# Patient Record
Sex: Male | Born: 1965
Health system: Southern US, Community
[De-identification: ages and names within clinical notes are randomized; demographics above are authoritative.]

## PROBLEM LIST (undated history)

## (undated) DIAGNOSIS — Q2112 Patent foramen ovale: Secondary | ICD-10-CM

## (undated) DIAGNOSIS — M7989 Other specified soft tissue disorders: Secondary | ICD-10-CM

## (undated) DIAGNOSIS — Q211 Atrial septal defect: Secondary | ICD-10-CM

## (undated) DIAGNOSIS — E785 Hyperlipidemia, unspecified: Secondary | ICD-10-CM

## (undated) DIAGNOSIS — I429 Cardiomyopathy, unspecified: Secondary | ICD-10-CM

## (undated) DIAGNOSIS — I351 Nonrheumatic aortic (valve) insufficiency: Secondary | ICD-10-CM

## (undated) HISTORY — PX: SHOULDER SURGERY: SHX246

## (undated) HISTORY — DX: Nonrheumatic aortic (valve) insufficiency: I35.1

## (undated) HISTORY — DX: Patent foramen ovale: Q21.12

## (undated) HISTORY — DX: Cardiomyopathy, unspecified: I42.9

## (undated) HISTORY — PX: OTHER SURGICAL HISTORY: SHX169

## (undated) HISTORY — PX: BACK SURGERY: SHX140

## (undated) HISTORY — PX: HERNIA REPAIR: SHX51

## (undated) HISTORY — PX: CARDIAC VALVE REPLACEMENT: SHX585

## (undated) HISTORY — PX: VASECTOMY: SHX75

## (undated) HISTORY — DX: Hyperlipidemia, unspecified: E78.5

## (undated) HISTORY — DX: Atrial septal defect: Q21.1

---

## 1997-11-27 ENCOUNTER — Ambulatory Visit (HOSPITAL_BASED_OUTPATIENT_CLINIC_OR_DEPARTMENT_OTHER): Admission: RE | Admit: 1997-11-27 | Discharge: 1997-11-27 | Payer: Self-pay | Admitting: Orthopedic Surgery

## 2001-08-18 ENCOUNTER — Encounter: Payer: Self-pay | Admitting: Internal Medicine

## 2001-08-18 ENCOUNTER — Ambulatory Visit (HOSPITAL_COMMUNITY): Admission: RE | Admit: 2001-08-18 | Discharge: 2001-08-18 | Payer: Self-pay | Admitting: Internal Medicine

## 2001-09-08 ENCOUNTER — Ambulatory Visit (HOSPITAL_COMMUNITY): Admission: RE | Admit: 2001-09-08 | Discharge: 2001-09-08 | Payer: Self-pay | Admitting: Internal Medicine

## 2001-09-08 ENCOUNTER — Encounter: Payer: Self-pay | Admitting: Internal Medicine

## 2002-01-09 ENCOUNTER — Observation Stay (HOSPITAL_COMMUNITY): Admission: AD | Admit: 2002-01-09 | Discharge: 2002-01-10 | Payer: Self-pay | Admitting: Family Medicine

## 2002-06-05 ENCOUNTER — Encounter: Payer: Self-pay | Admitting: Internal Medicine

## 2002-06-05 ENCOUNTER — Ambulatory Visit (HOSPITAL_COMMUNITY): Admission: RE | Admit: 2002-06-05 | Discharge: 2002-06-05 | Payer: Self-pay | Admitting: Internal Medicine

## 2002-06-26 ENCOUNTER — Encounter: Payer: Self-pay | Admitting: Internal Medicine

## 2002-06-26 ENCOUNTER — Ambulatory Visit (HOSPITAL_COMMUNITY): Admission: RE | Admit: 2002-06-26 | Discharge: 2002-06-26 | Payer: Self-pay | Admitting: Internal Medicine

## 2003-02-22 ENCOUNTER — Ambulatory Visit (HOSPITAL_COMMUNITY): Admission: RE | Admit: 2003-02-22 | Discharge: 2003-02-22 | Payer: Self-pay | Admitting: Internal Medicine

## 2003-06-11 ENCOUNTER — Ambulatory Visit (HOSPITAL_COMMUNITY): Admission: RE | Admit: 2003-06-11 | Discharge: 2003-06-11 | Payer: Self-pay | Admitting: Orthopedic Surgery

## 2004-10-08 ENCOUNTER — Ambulatory Visit: Payer: Self-pay | Admitting: Orthopedic Surgery

## 2004-10-22 ENCOUNTER — Ambulatory Visit: Payer: Self-pay | Admitting: Orthopedic Surgery

## 2004-10-30 ENCOUNTER — Ambulatory Visit (HOSPITAL_COMMUNITY): Admission: RE | Admit: 2004-10-30 | Discharge: 2004-10-30 | Payer: Self-pay | Admitting: Orthopedic Surgery

## 2004-10-30 ENCOUNTER — Ambulatory Visit: Payer: Self-pay | Admitting: Orthopedic Surgery

## 2004-11-02 ENCOUNTER — Ambulatory Visit: Payer: Self-pay | Admitting: Orthopedic Surgery

## 2004-11-09 ENCOUNTER — Ambulatory Visit: Payer: Self-pay | Admitting: Orthopedic Surgery

## 2004-11-23 ENCOUNTER — Ambulatory Visit: Payer: Self-pay | Admitting: Orthopedic Surgery

## 2004-12-21 ENCOUNTER — Ambulatory Visit: Payer: Self-pay | Admitting: Orthopedic Surgery

## 2005-01-13 ENCOUNTER — Ambulatory Visit: Payer: Self-pay | Admitting: Orthopedic Surgery

## 2005-01-20 ENCOUNTER — Ambulatory Visit: Payer: Self-pay | Admitting: Orthopedic Surgery

## 2005-10-06 ENCOUNTER — Ambulatory Visit: Payer: Self-pay | Admitting: Orthopedic Surgery

## 2005-11-10 ENCOUNTER — Ambulatory Visit: Payer: Self-pay | Admitting: Orthopedic Surgery

## 2006-01-11 ENCOUNTER — Ambulatory Visit (HOSPITAL_COMMUNITY): Admission: RE | Admit: 2006-01-11 | Discharge: 2006-01-11 | Payer: Self-pay | Admitting: Orthopedic Surgery

## 2006-11-01 ENCOUNTER — Ambulatory Visit (HOSPITAL_COMMUNITY): Admission: RE | Admit: 2006-11-01 | Discharge: 2006-11-01 | Payer: Self-pay | Admitting: Internal Medicine

## 2007-11-29 ENCOUNTER — Ambulatory Visit (HOSPITAL_COMMUNITY): Admission: RE | Admit: 2007-11-29 | Discharge: 2007-11-29 | Payer: Self-pay | Admitting: Family Medicine

## 2008-05-17 ENCOUNTER — Ambulatory Visit (HOSPITAL_COMMUNITY): Admission: RE | Admit: 2008-05-17 | Discharge: 2008-05-17 | Payer: Self-pay | Admitting: Internal Medicine

## 2008-06-03 ENCOUNTER — Ambulatory Visit (HOSPITAL_COMMUNITY): Admission: RE | Admit: 2008-06-03 | Discharge: 2008-06-04 | Payer: Self-pay | Admitting: Neurosurgery

## 2010-05-17 ENCOUNTER — Encounter: Payer: Self-pay | Admitting: Orthopedic Surgery

## 2010-08-11 LAB — CBC
HCT: 44.9 % (ref 39.0–52.0)
Hemoglobin: 15.8 g/dL (ref 13.0–17.0)
MCHC: 35.3 g/dL (ref 30.0–36.0)
MCV: 91.6 fL (ref 78.0–100.0)
Platelets: 186 10*3/uL (ref 150–400)
RBC: 4.91 MIL/uL (ref 4.22–5.81)
RDW: 12.4 % (ref 11.5–15.5)
WBC: 5.9 10*3/uL (ref 4.0–10.5)

## 2010-09-08 NOTE — Op Note (Signed)
NAME:  Donald [name], SEVERNS NO.:  [id number]   MEDICAL RECORD NO.:  [id number]          PATIENT TYPE:  OIB   LOCATION:  3533                         FACILITY:  [facility]   PHYSICIAN:  Hewitt Shorts, M.D.DATE OF BIRTH:  03-03-1966   DATE OF PROCEDURE:  DATE OF DISCHARGE:                               OPERATIVE REPORT   PREOPERATIVE DIAGNOSIS:  1. Left L5 lumbar disk herniation.  2. Lumbar degenerative disk disease.  3. Lumbar spondylosis.  4. Lumbar radiculopathy.   POSTOPERATIVE DIAGNOSES:  1. Left L5 lumbar disk herniation.  2. Lumbar degenerative disk disease.  3. Lumbar spondylosis.  4. Lumbar radiculopathy.   PROCEDURE:  Left L5 hemilaminotomy and microdiskectomy with  microdissection.   SURGEON:  Hewitt Shorts, MD   ANESTHESIA:  General endotracheal.   INDICATIONS:  The patient is a 45 year old man who presented with left  lumbar radiculopathy.  He was found to have a very large disk herniation  that had migrated behind the body of the L5 causing significant thecal  sac and nerve root compression.  Decision was made to proceed with  laminotomy and microdiskectomy.   Of note, the patient has had a previous left L5-S1 diskectomy in 1990 at  [facility], complicated by cerebrospinal fluid leak that required  two further surgeries.  The previous laminotomy was noted on MRI.   PROCEDURE:  The patient was brought to the operating room, placed under  general endotracheal anesthesia.  The patient was turned to a prone  position.  Lumbar region was prepped with Betadine soap and solution and  draped in a sterile fashion.  The previous midline incision was marked  and the midline was infiltrated with local anesthetic with epinephrine  and an x-ray taken and the L5 lamina identified and incision was made,  centered over the L5 lamina through the previous midline incision and  dissection was carried down through the subcutaneous tissue.  Bipolar  cautery and electrocautery was used to maintain hemostasis.  Dissection  was carried down to the lumbar fascia, which was incised on the left  side of the midline and paraspinal muscles were dissected from the  spinous process and lamina in a subperiosteal fashion.  A self-retaining  retractor was placed at the L4-L5, and the L5-S1 intralaminar space were  identified, the previous left L5-S1 laminotomy was noted.  The x-ray was  taken and the L5 lamina confirmed and identified and then the microscope  was draped and brought into the field to provide additional navigation,  illumination, and visualization.  The remainder of the decompression was  performed using microdissection and microsurgical technique.   We dissected the edge of the superior aspect of the L5 laminotomy and  also defined the superior edge of the L5 lamina at the L4-L5  intralaminar space and then using the Stryker pneumatic high-speed  drill, a left L5 hemilaminectomy was performed along with the use of  Kerrison punches.  We carefully dissected the underlying thecal sac from  mid lamina and once the hemilaminectomy was completed, we dissected  laterally to the lateral aspect of  the epidural space through the  ventral lateral aspect of the spinal canal.  The disk herniation was  identified and we began diskectomy, removing free fragment with  pituitary rongeurs and using micro hook, we were able to further  mobilize this very large disk herniation from the surrounding epidural  tissues and the large disk herniation was removed in a piecemeal fashion  and we thoroughly examined the epidural space, removing all loose  fragments and disk material.  We did not enter into the disk space at  either the L4-L5 or the L5-S1 level, but we slowly removed the large  free fragment from the epidural space and at the end all loose fragment  of the disk material were removed.  The wound was irrigated with  Bacitracin solution.  We used  some Gelfoam with thrombin to help  establish hemostasis and that was removed prior to closure.  The wound  was irrigated numerous times with Bacitracin solution.  Once hemostasis  was confirmed, we instilled 2 mL of fentanyl and 80 mg of Depo-Medrol  into the epidural space and proceeded with closure.  The deep fascia  with interrupted undyed #1 Vicryl sutures.  Subcutaneous and  subcuticular were closed with interrupted inverted 2-0 undyed Vicryl  sutures.  The skin was approximated with Dermabond.  The procedure was  tolerated well.  The estimated blood loss was 25 mL.  Sponge and needle  count were correct.  Following surgery, the patient was returned back to  the supine position to be reversed from the anesthetic, extubated, and  transferred to the recovery room for further care.      Hewitt Shorts, M.D.  Electronically Signed     RWN/MEDQ  D:  06/03/2008  T:  06/04/2008  Job:  [id number]

## 2010-09-11 NOTE — H&P (Signed)
NAME:  Donald [name], Donald [name] NO.:  [id number]   MEDICAL RECORD NO.:  [id number]                  PATIENT TYPE:   LOCATION:                                       FACILITY:   PHYSICIAN:  Vickki Hearing, M.D.           DATE OF BIRTH:   DATE OF ADMISSION:  DATE OF DISCHARGE:                                HISTORY & PHYSICAL   CHIEF COMPLAINT:  Right shoulder pain.   HISTORY:  A 45 year old male status post open distal clavicle excision of  the left shoulder without complaints.  However, he does complain in the  right shoulder which has not responded to nonoperative treatment including  injections, pain medication and anti-inflammatories.  At this time, he is  having severe right shoulder pain with burning over the distal clavicle,  decreased range of motion and inflammatory type pain of the right anterior  deltoid and subacromial region.  There are associated signs of weakness and  nocturnal symptoms related to the [facility] joint and right deltoid.   REVIEW OF SYSTEMS:  Migraines and anxiety.  Otherwise, normal.   ALLERGIES:  ULTRAM.   MEDICAL PROBLEMS:  Insomnia.   SURGERY:  Herniorrhaphy x5, three surgical disks, left tendon injury of the  thumb, left shoulder distal clavicle excision, pharmacy __________  .   MEDICATIONS:  1. Ambien.  2. Klonopin.  3. Lorcet Plus.   FAMILY HISTORY:  Negative.   SOCIAL HISTORY:  The patient is married.  He is a Merchandiser, retail at ________.  He does not smoke.  He does drink alcohol socially.  Caffeine use - yes.  Highest grade completed - high school diploma and this various technical  certificates.   PHYSICAL EXAMINATION:  VITAL SIGNS:  His weight is 210, pulse 80,  respiratory rate 18.  GENERAL:  He is well developed and nourished.  Grooming and hygiene are  normal.  He has no deformity.  Body habitus is normal with slightly greater  than average height and frame is medium.  CARDIOVASCULAR:  No swelling or  varicosities.  Pulses are intact.  Normal  temperature. No edema or tenderness.  CERVICAL:  Normal cervical lymph nodes.  NEUROLOGIC:  Gait and station is normal.  Left shoulder range of motion  normal.  Stability strength is normal without swelling scar noted left  shoulder.  EXTREMITIES:  Right upper extremity:  Active range of motion, flexion 90  degrees.  External rotation 45%.  Internal rotation front pocket, passive  range of motion is painful about 90 degrees.  He has negative drop test.  Shoulder is stable.  His cuff is intact, it appears.  His impingement signs  were positive and his cross chest AC joint test was positive along with  tenderness over the [facility] joint.  His skin was normal on the right and  including head and neck.  His neurologic and psychological testing showed  normal reflexes, sensation and coordination.  He was awake, alert and  oriented x3.  No depression, anxiety or agitation.   Data included MRI.  In the MRI report, he has a partial tear of his  supraspinatus tendon.  He has a degenerative AC joint.   DIAGNOSES:  1. AC joint arthritis.  2. Partial tear rotator cuff.  3. Impingement syndrome right shoulder.   RECOMMENDATIONS:  Arthroscopic subacromial decompression, possible open  rotator cuff repair, open versus arthroscopic distal clavicle excision.   The patient is given informed consent and agrees to proceed with the  procedure understanding the risks and benefits and possibility of being out  of work for two to three months.   Plan procedure code 10272 and 23120.51.  Diagnosis code 715.117, 26.10,  727.61     ___________________________________________                                         Vickki Hearing, M.D.   SEH/MEDQ  D:  05/16/2003  T:  05/16/2003  Job:  [id number]

## 2010-09-11 NOTE — H&P (Signed)
NAME:  Donald [name], Donald [name] NO.:  [id number]   MEDICAL RECORD NO.:  [id number]          PATIENT TYPE:  AMB   LOCATION:  DAY                           FACILITY:  [facility]   PHYSICIAN:  Vickki Hearing, M.D.DATE OF BIRTH:  [birth year]   DATE OF ADMISSION:  10/29/2004  DATE OF DISCHARGE:  LH                                HISTORY & PHYSICAL   CHIEF COMPLAINT:  Right shoulder pain.   HISTORY OF PRESENT ILLNESS:  This is a 45 year old male who had a right  shoulder arthroscopic subacromial decompression and open Mumford procedure.  In February 2005, he did well until approximately 1-2 months ago when his  pain in his shoulder over this [facility] joint posteriorly started to come back.  He did have a recent MRI done which showed tendinosus in the distal  supraspinatus and infraspinatus tendons with partial thickness tearing of  the distal supraspinatus.  Marrow edema was noted at the [facility] joint remaining  distal clavicle.  We treated him with injection, observation, rest and anti-  inflammatories as well as analgesics and his pain has persisted.  He is  therefore presenting now for a repeat arthroscopic procedure and exploration  of the posterior aspect of the open Mumford.  On occasion, there is residual  bone or osteophytes which occur in this area.   PAST MEDICAL HISTORY:  1.  Migraines.  2.  Anxiety.  3.  History of insomnia.   PAST SURGICAL HISTORY:  1.  Herniorrhaphy x5.  2.  Surgical disc x3.  3.  Tendon injury, left thumb, with repair.  4.  Left shoulder distal clavicle excision.   MEDICATIONS:  Klonopin, Ambien.   ALLERGIES:  ULTRAM.   FAMILY HISTORY:  Negative.   SOCIAL HISTORY:  He is married.  He is a Merchandiser, retail.  He does not smoke.  He  drinks socially.  His caffeine use is reported as positive.  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   PHYSICAL EXAMINATION:  VITAL SIGNS:  Weight 210, pulse 80, respirations 18.  GENERAL:   Well-developed, well-nourished.  Grooming and hygiene are normal.  No deformity.  Body habitus normal.  Slightly greater than average height  and frame is medium.  CARDIAC:  No swelling or varicosities.  Temperature normal.  No edema or  tenderness.  LYMPHS:  Cervical lymph nodes are benign.  NEUROLOGIC:  Normal neurologic and psychological tests.  EXTREMITIES:  Right shoulder with tenderness at the posterior aspect of his  [facility] joint resection.  There is painful range of motion of the shoulder with a  positive impingement sign.  Supraspinatus tendon, however, is graded 5/5.  Shoulder is stable.  He has MRI findings as noted.   IMPRESSION:  Pain, right shoulder.  Possible residual osteophytes of distal  clavicle.   PLAN:  Right shoulder arthroscopy and exploration of the resection of bone  at the [place] site.       SEH/MEDQ  D:  10/29/2004  T:  10/29/2004  Job:  [id number]

## 2010-09-11 NOTE — Op Note (Signed)
NAME:  CALVERT, CHARLAND                           ACCOUNT NO.:  [id number]   MEDICAL RECORD NO.:  [id number]                   PATIENT TYPE:  AMB   LOCATION:  DAY                                  FACILITY:  [facility]   PHYSICIAN:  Vickki Hearing, M.D.           DATE OF BIRTH:  09-Jan-1966   DATE OF PROCEDURE:  DATE OF DISCHARGE:                                 OPERATIVE REPORT   PREOPERATIVE DIAGNOSES:  1. Partial tear of right rotator cuff.  2. Acromioclavicular joint arthritis.  3. Impingement syndrome, right shoulder.   POSTOPERATIVE DIAGNOSES:  1. Partial tear of right rotator cuff.  2. Acromioclavicular joint arthritis.  3. Impingement syndrome, right shoulder.   PROCEDURE:  Arthroscopic subacromial decompression, debridement of  undersurface of rotator cuff, and open Mumford procedure.   SURGEON:  Vickki Hearing, M.D.   ANESTHESIA:  General.   FINDINGS:  There was undersurface tearing of the rotator cuff.  There was a  sublabral foramen.  There were some degenerative changes of the biceps  tendon.  There was AC joint arthrosis.  The subacromial space was relatively  benign.   INDICATIONS FOR PROCEDURE:  Right shoulder persistent pain after  nonoperative treatment.   DESCRIPTION OF PROCEDURE:  Mr. Masden was identified in the holding area.  His  right shoulder was marked as the operative site.  He was taken to the  operating room after being given a gram of Ancef and general anesthetic.  He  had a sterile prep and drape after being placed in a lateral decubitus  position with a 10-pound weight and a shoulder distraction harness applied  to his right arm.  We took a time out and confirmed the patient's name,  procedure, and extremity.  All agreed, and we proceeded.   We did a diagnostic arthroscopy via a posterior portal.  We established an  anterior portal with an inside out technique.  We did a debridement of his  rotator cuff and biceps tendon degenerative changes.   We placed the scope in  the subacromial space and made a lateral portal.  We did a subacromial  decompression.  We made a second anterior portal and did an AC joint  resection.  At that point, visualization was poor, and we proceeded to an  open procedure.   An incision was made over the [facility] joint, divided down to the fascia which was  split in line with the clavicle.  The [facility] joint was resected using a saw.  We  placed bone wax over the end of the bone, irrigated the wound, and closed in  layered fashion with #1 and 2-0 Vicryl suture.  We applied staples and 30 cc  of Marcaine in the area.  We closed the portal sites with Prolene and Steri-  Strips.   We dressed him sterilely, extubated him, took him to the recovery room, and  placed him in a shoulder  immobilizer and CryoCuff.  He was allowed to be  discharged with a followup in 48 hours.      ___________________________________________                                            Vickki Hearing, M.D.   SEH/MEDQ  D:  06/11/2003  T:  06/11/2003  Job:  [id number]

## 2010-09-11 NOTE — H&P (Signed)
NAME:  CELESTINE, BOUGIE NO.:  [id number]   MEDICAL RECORD NO.:  [id number]                   PATIENT TYPE:  INP   LOCATION:  A228                                 FACILITY:  [facility]   PHYSICIAN:  Corrie Mckusick, M.D.               DATE OF BIRTH:  08/16/1965   DATE OF ADMISSION:  01/09/2002  DATE OF DISCHARGE:                                HISTORY & PHYSICAL   PRIMARY M.D.:  Dr. Sherwood Gambler.   HISTORY OF PRESENT ILLNESS:  The patient is a 45 year old gentleman with a  history of migraine headaches who presents with intermittent chest pain off  and on for the months preceding.  Now for the last week he has had  increasing left-sided chest pain, mostly sharp in nature and not too dull  until last evening.  At work he had been working increased hours and seemed  to get more increasing chest pain with exertion.  When he would rest, the  chest pain would go away.  The chest pain seemed to be midsternal and in the  left side as well.  Some radiation to the left shoulder and left forearm.  There is no associated nausea, vomiting, diaphoresis.  No shortness of  breath.  No fevers or other symptomatology.   PAST MEDICAL HISTORY:  Migraine headaches.   PAST SURGICAL HISTORY:  1. Hernia repair in 1974.  2. Left leg surgery in 1981.  3. Back surgery x 3 in 1996.  4. Left hand surgery x 3 in 1998 and 1999.   SOCIAL HISTORY:  Past history of smoking but none now.  Occasional alcohol  use.   FAMILY HISTORY:  Significant for some early coronary disease.   ADMISSION MEDICATIONS:  None.   ALLERGIES:  ULTRAM and LATEX.   OCCUPATION:  Soil scientist.   PHYSICAL EXAMINATION:  VITAL SIGNS:  Pulse 78, respirations 18, blood  pressure 110/85.  Weight 213.  GENERAL:  When I saw the patient he was pleasant, talkative, in no acute  distress, and was chest pain-free.  HEENT:  Normocephalic, atraumatic.  Pupils are equal and round with  extraocular movements intact.  Nasal  and oropharynx clear.  NECK:  Supple.  No JVD.  CHEST:  Clear to auscultation bilaterally.  CARDIOVASCULAR:  Regular rate and rhythm with occasional ectopy.  Normal S1,  S2.  No S3, S4, gallops or rubs.  ABDOMEN:  Soft, nontender, nondistended.  EXTREMITIES:  No cyanosis, clubbing, no edema.   LABORATORY DATA:  ECG showed normal sinus rhythm with a rate of 68, with  incomplete right bundle branch block, early repolarization, and some strain.  No other acute ST changes.   ASSESSMENT:  The patient is a 45 year old gentleman with intermittent chest  pain, now progressive.  Questionable exertional.   PLAN:  1. Admit to [facility] on 2A Telemetry for rule out protocol.  2. CPK, MB, troponins  x 3 q.8h.  3. CBC, Chem-12 on admission.  4. Fasting lipids.  5. Start Toprol at 50 mg q.d.  6. Aspirin 325 q.d., first dose given in the office.  7. Trial of Aciphex as could be GERD-related.  8. Consult [facility] for further risk stratification.                                               Corrie Mckusick, M.D.    JCG/MEDQ  D:  01/09/2002  T:  01/09/2002  Job:  [id number]

## 2010-09-11 NOTE — Op Note (Signed)
NAME:  [name], [name]:  [id number]   MEDICAL RECORD NO.:  [id number]          PATIENT TYPE:  AMB   LOCATION:  DAY                           FACILITY:  [facility]   PHYSICIAN:  Vickki Hearing, M.D.DATE OF BIRTH:  12-31-1965   DATE OF PROCEDURE:  10/30/2004  DATE OF DISCHARGE:                                 OPERATIVE REPORT   PREOPERATIVE DIAGNOSIS:  Pain, right shoulder, rotator cuff syndrome.   POSTOPERATIVE DIAGNOSIS:  Torn rotator cuff, right shoulder, supraspinatus  tendon and leading edge of subscapularis.   INDICATIONS FOR PROCEDURE:  Persistent pain.   ANESTHETIC:  General.   OPERATIVE FINDINGS:  The leading edge of the subscapularis tendon was torn.  There was a undersurface tear of the rotator cuff approximately 75% in  depth. There was some sinusitis along the biceps anchor and the glenohumeral  joint. The subacromial space decompression was normal. There was a posterior  superior spur along the posterior superior distal clavicle.   SPECIMENS:  None.   BLOOD LOSS:  20 to 30 cc.   COMPLICATIONS:  No complications. The patient went to PACU in good  condition.   The patient was identified as Donald [name]. His right shoulder was marked as  the surgical site, countersigned by the surgeon. History and physical was  updated. Antibiotics were given. He was taken to the operating room for  general anesthesia. After successful anesthesia, he was placed in a modified  beach-chair position on a Schlein shoulder positioner. After sterile prep  and drape, time-out was taken and completed as required.   The posterior approach to the shoulder was used. A diagnostic arthroscopy  was done via posterior approach. Findings are as stated. Through an anterior  portal created with an inside-out technique, a shaver was used to debride  the biceps tendon. Leading edge of the subscapularis, undersurface tear of  the supraspinatus.   The scope was then placed in  the subacromial space. The decompression from  the previous operation was completely normal. There was evidence of superior  surface tearing as well involving the supraspinatus tendon.   Through a lateral portal, a suture was placed in the tear to mark the tear,  and this was taken out through the anterior portal.   An incision was made over the anterolateral acromion, taken down to the  bursa the deltoid which was split in line with the skin incision. The bursa  was removed, and the tear marking suture was used to find the rotator cuff  tear at which time we placed four fiber wire sutures. We then turned our  attention to the previous Mumford incision and took that down to the  underlying periosteum and removed a posterior superior spur.   Both wounds were irrigated and closed in layered fashion. We used staples to  close the skin. We injected 30 cc of Marcaine. We placed the patient in a  CryoCuff after the sterile dressings were applied. He was extubated in  recovery room in stable condition.   He will follow up on Monday and will start physical therapy  next week       SEH/MEDQ  D:  10/30/2004  T:  10/30/2004  Job:  [id number]

## 2010-10-27 ENCOUNTER — Encounter: Payer: Self-pay | Admitting: Orthopedic Surgery

## 2010-10-27 ENCOUNTER — Ambulatory Visit (INDEPENDENT_AMBULATORY_CARE_PROVIDER_SITE_OTHER): Payer: BC Managed Care – PPO | Admitting: Orthopedic Surgery

## 2010-10-27 VITALS — Ht 73.0 in | Wt 218.0 lb

## 2010-10-27 DIAGNOSIS — S43429A Sprain of unspecified rotator cuff capsule, initial encounter: Secondary | ICD-10-CM

## 2010-10-27 DIAGNOSIS — M75101 Unspecified rotator cuff tear or rupture of right shoulder, not specified as traumatic: Secondary | ICD-10-CM

## 2010-10-27 DIAGNOSIS — S43439A Superior glenoid labrum lesion of unspecified shoulder, initial encounter: Secondary | ICD-10-CM

## 2010-10-27 DIAGNOSIS — S4990XA Unspecified injury of shoulder and upper arm, unspecified arm, initial encounter: Secondary | ICD-10-CM | POA: Insufficient documentation

## 2010-10-27 DIAGNOSIS — S43499A Other sprain of unspecified shoulder joint, initial encounter: Secondary | ICD-10-CM

## 2010-10-27 DIAGNOSIS — S46819A Strain of other muscles, fascia and tendons at shoulder and upper arm level, unspecified arm, initial encounter: Secondary | ICD-10-CM

## 2010-10-27 DIAGNOSIS — S4980XA Other specified injuries of shoulder and upper arm, unspecified arm, initial encounter: Secondary | ICD-10-CM

## 2010-10-27 HISTORY — DX: Superior glenoid labrum lesion of unspecified shoulder, initial encounter: S43.439A

## 2010-10-27 HISTORY — DX: Unspecified rotator cuff tear or rupture of right shoulder, not specified as traumatic: M75.101

## 2010-10-27 MED ORDER — HYDROCODONE-ACETAMINOPHEN 7.5-500 MG PO TABS: 1.0000 | ORAL_TABLET | Freq: Four times a day (QID) | ORAL | Status: DC | PRN

## 2010-10-27 NOTE — Patient Instructions (Signed)
Continue the ibuprofen and hydrocodone   MRI will be scheduled

## 2010-10-27 NOTE — Progress Notes (Signed)
Separately identifiable x-ray report   AP and lateral with 10 caudal tilt RIGHT  shoulder  Normal glenohumeral joint normal rotator outlet but no signs of chronic cuff disease  Impression normal shoulder x-rays  

## 2010-10-27 NOTE — Progress Notes (Signed)
New patient   Right shoulder pain   45 yo male s/p Mumford and cuff repair injured right shoulder, felt a rip, about 6 weeks ago while blocking a shot playing basketball. The should went into abduction external rotation. He has sharp, burning pain 3-7/10 which is constant. The pain is persistent despite hydrocodone 7.5 and naproxen, ibuprofen   He is also having pain when he externally rotates his arm and when he internally rotates the arm. Past the mid coronal plane.  There is pain radiating down the biceps.  Review of systems: Complains of her emergency numbness in his feet after his 4th back surgery in numbness in the LEFT hand. After LEFT shoulder surgery. Otherwise, normal.  Vital signs are stable as recorded  General appearance is normal  The patient is alert and oriented x3  The patient's mood and affect are normal  Gait assessment: Normal The cardiovascular exam reveals normal pulses and temperature without edema swelling.  The lymphatic system is negative for palpable lymph nodes  The sensory exam is normal.  There are no pathologic reflexes.  Balance is normal.   Exam of the RIGHT shoulder Inspection There is tenderness over the a.c. Joint. Her to incisions, one over the a.c. Joint, and one over the anterolateral acromion. Mild swelling of the RIGHT shoulder. Tenderness along the posterior joint line Range of motion Passive range of motion is normal and for elevation, normal and external rotation, abnormal,in internal rotation with painful. External rotation with his arm at his side and Stability There is pain in abduction, external rotation, and a positive apprehension test Strength Weakness of the rotator cuff, and abduction, grade 4 Skin Normal  X-rays were normal.  Impression #1 labral tear. #2 rotator cuff tear. #3 shoulder injury.  Plan continue hydrocodone. Order MRI shoulder.

## 2010-10-29 ENCOUNTER — Other Ambulatory Visit: Payer: Self-pay | Admitting: Orthopedic Surgery

## 2010-10-29 ENCOUNTER — Telehealth: Payer: Self-pay | Admitting: Radiology

## 2010-10-29 DIAGNOSIS — M75100 Unspecified rotator cuff tear or rupture of unspecified shoulder, not specified as traumatic: Secondary | ICD-10-CM

## 2010-10-29 DIAGNOSIS — S43439A Superior glenoid labrum lesion of unspecified shoulder, initial encounter: Secondary | ICD-10-CM

## 2010-10-29 NOTE — Telephone Encounter (Signed)
I called to give the patient his MRI appointment at [name] on 11-02-10 at 4:30. Patient has [employer], authorization [id number] and it expires on 11-27-10. Patient has an appointment to follow up back here in the office.

## 2010-11-02 ENCOUNTER — Inpatient Hospital Stay (HOSPITAL_COMMUNITY): Admission: RE | Admit: 2010-11-02 | Payer: BC Managed Care – PPO | Source: Ambulatory Visit

## 2010-11-04 ENCOUNTER — Ambulatory Visit (HOSPITAL_COMMUNITY): Admission: RE | Admit: 2010-11-04 | Payer: BC Managed Care – PPO | Source: Ambulatory Visit

## 2010-11-05 ENCOUNTER — Other Ambulatory Visit (HOSPITAL_COMMUNITY): Payer: BC Managed Care – PPO

## 2010-11-11 ENCOUNTER — Telehealth: Payer: Self-pay | Admitting: Orthopedic Surgery

## 2010-11-11 NOTE — Telephone Encounter (Signed)
Per call to patient re: scheduled appointment 11/12/10 for "review of MRI results", patient did not have the MRI, originally scheduled 11/02/10 at [facility].  He states he would like to hold on the MRI for now, and will call back to re-schedule if he wishes to have it done.  I advised about his pre-cert expiration per previous note.

## 2010-11-12 ENCOUNTER — Ambulatory Visit: Payer: BC Managed Care – PPO | Admitting: Orthopedic Surgery

## 2012-08-08 ENCOUNTER — Other Ambulatory Visit (HOSPITAL_COMMUNITY): Payer: Self-pay | Admitting: Internal Medicine

## 2012-08-08 DIAGNOSIS — R945 Abnormal results of liver function studies: Secondary | ICD-10-CM

## 2012-08-14 ENCOUNTER — Ambulatory Visit (HOSPITAL_COMMUNITY): Payer: Managed Care, Other (non HMO) | Attending: Internal Medicine

## 2013-07-16 ENCOUNTER — Telehealth: Payer: Self-pay | Admitting: Orthopedic Surgery

## 2013-07-16 NOTE — Telephone Encounter (Signed)
Notes faxed as per request to [facility] (most recent office note and imaging report from 2007,  and operative reports - chart was pulled from inactive files) and sent per signed authorization form to attention Melissa at Fax# [phone].

## 2013-11-22 ENCOUNTER — Encounter: Payer: Self-pay | Admitting: Orthopedic Surgery

## 2013-11-22 ENCOUNTER — Ambulatory Visit (INDEPENDENT_AMBULATORY_CARE_PROVIDER_SITE_OTHER): Payer: 59 | Admitting: Orthopedic Surgery

## 2013-11-22 VITALS — BP 156/96 | Ht 73.0 in | Wt 206.0 lb

## 2013-11-22 DIAGNOSIS — D492 Neoplasm of unspecified behavior of bone, soft tissue, and skin: Secondary | ICD-10-CM

## 2013-11-22 HISTORY — DX: Neoplasm of unspecified behavior of bone, soft tissue, and skin: D49.2

## 2013-11-22 NOTE — Progress Notes (Signed)
Subjective:     Patient ID: Donald [name], male   DOB: 1965-08-01, 48 y.o.   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 He says his leg feels similar to that as far as he can tell. He denies injury. He started having pain about 2 weeks ago it's just lateral to the incision over his distal tibia where the tumor was removed he describes aching pain burning sensation sharp pain it is worse after activity. He was treated for since splints with the IM shot of steroid and rest but he really can't angulate for any significant amount of time he can stand on the leg for any significant amount of time and he comes to [place] at the request of Dr. Gerarda Fraction for further evaluation and treatment   Review of Systems He denies weight loss weight gain fever chills at night he does have some difficulty sleeping and some depression otherwise review of systems normal    Objective:   Physical Exam  Vital signs are stable as recorded  General appearance is normal, body habitus normal  The patient is alert and oriented x 3  The patient's mood and affect are normal  Gait assessment: Normal but he hasn't been on his leg much lately  The cardiovascular exam reveals normal pulses and temperature without edema or  swelling.  The lymphatic system is negative for palpable lymph nodes  The sensory exam is normal.  There are no pathologic reflexes.  Balance is normal. Upper extremity exam  The right and left upper extremity:  Inspection and palpation revealed no abnormalities in the upper extremities.  Range of motion is full without contracture. Motor exam is normal with grade 5 strength. The joints are fully reduced without subluxation. There is no atrophy or tremor and muscle tone is normal.  All joints are stable.     Exam of the right lower extremity is  normal Exam of the left tibia shows a scar longitudinal over the distal third of the tibia approximately 5-7 cm in length. There is no redness there but there is tenderness on the bone this is exacerbated by dorsiflexion of the foot although the muscle is nontender ankle joint remains stable there is no pain or tenderness or swelling at the knee lymph nodes are negative muscle tone is normal the skin is otherwise intact A/P X-rays are notable for a possible bone and/or foreign body just anterior to the tibia looks to be chronic there is increased sclerosis above the level of the previous tumor excision and no signs of tumor on the bone there is no soft tissue swelling     Assessment:     He will need to be reevaluated for recurrence of tumor. I ordered a CBC with sed rate is C. reactive protein and a CT with and without contrast and a creatinine to get clearance for the CT and the contrast.    Plan:     Further workup planned, out of work

## 2013-11-22 NOTE — Patient Instructions (Addendum)
OOW until 11/27/13 - 12/07/13 CT scan ordered and labs

## 2013-11-23 LAB — CBC WITH DIFFERENTIAL/PLATELET
Basophils Absolute: 0 10*3/uL (ref 0.0–0.1)
Basophils Relative: 0 % (ref 0–1)
Eosinophils Absolute: 0.1 10*3/uL (ref 0.0–0.7)
Eosinophils Relative: 2 % (ref 0–5)
HCT: 45.5 % (ref 39.0–52.0)
Hemoglobin: 16.5 g/dL (ref 13.0–17.0)
Lymphocytes Relative: 34 % (ref 12–46)
Lymphs Abs: 2.3 10*3/uL (ref 0.7–4.0)
MCH: 32.7 pg (ref 26.0–34.0)
MCHC: 36.3 g/dL — ABNORMAL HIGH (ref 30.0–36.0)
MCV: 90.1 fL (ref 78.0–100.0)
Monocytes Absolute: 0.8 10*3/uL (ref 0.1–1.0)
Monocytes Relative: 12 % (ref 3–12)
Neutro Abs: 3.6 10*3/uL (ref 1.7–7.7)
Neutrophils Relative %: 52 % (ref 43–77)
Platelets: 175 10*3/uL (ref 150–400)
RBC: 5.05 MIL/uL (ref 4.22–5.81)
RDW: 13.4 % (ref 11.5–15.5)
WBC: 6.9 10*3/uL (ref 4.0–10.5)

## 2013-11-23 LAB — CREATININE, SERUM: Creat: 0.79 mg/dL (ref 0.50–1.35)

## 2013-11-23 LAB — SEDIMENTATION RATE: Sed Rate: 1 mm/hr (ref 0–16)

## 2013-11-23 LAB — C-REACTIVE PROTEIN: CRP: <0.5 mg/dL (ref <0.60)

## 2013-11-28 ENCOUNTER — Telehealth: Payer: Self-pay | Admitting: Orthopedic Surgery

## 2013-11-28 NOTE — Telephone Encounter (Signed)
CT was not approved, will require physician to physician review

## 2013-11-29 NOTE — Telephone Encounter (Signed)
Get info call them and when they get on the phone i will talk to them

## 2013-12-03 NOTE — Telephone Encounter (Signed)
[name] is gathering more information and waiting on notes from [facility]

## 2013-12-05 ENCOUNTER — Telehealth: Payer: Self-pay | Admitting: Orthopedic Surgery

## 2013-12-05 NOTE — Telephone Encounter (Signed)
Patient called about (1) lab results; said awaiting blood work results.                                    (2) CT scan - He does not have a follow up appointment at this time, as insurance has not yet approved CT of tibia fibula w/and w/out contrast, per previous phone note.                                  (3) work note - He was scheduled to return to work Monday, 12/10/13; due to continuing pain, can an extension of out of work note be issued? His cell # (which has a secure voice mail) is [phone].

## 2013-12-07 NOTE — Telephone Encounter (Signed)
YES REFER HIM TO [facility]  TELL HIM LAB RESULTS NORMAL

## 2013-12-10 ENCOUNTER — Other Ambulatory Visit: Payer: Self-pay | Admitting: *Deleted

## 2013-12-10 DIAGNOSIS — D492 Neoplasm of unspecified behavior of bone, soft tissue, and skin: Secondary | ICD-10-CM

## 2013-12-10 NOTE — Telephone Encounter (Signed)
Noted; forwarding to nurse per Dr Ruthe Mannan response.

## 2013-12-10 NOTE — Telephone Encounter (Signed)
Referral sent to [facility]

## 2013-12-10 NOTE — Telephone Encounter (Signed)
Routing to [employer]

## 2013-12-20 NOTE — Telephone Encounter (Signed)
PATIENT HAS APPOINTMENT WITH DR Cherre Blanc 01/01/14 @ 9:30AM

## 2015-04-17 ENCOUNTER — Ambulatory Visit: Payer: Self-pay

## 2015-04-17 ENCOUNTER — Other Ambulatory Visit: Payer: Self-pay | Admitting: Occupational Medicine

## 2015-04-17 DIAGNOSIS — Z0000 Encounter for general adult medical examination without abnormal findings: Secondary | ICD-10-CM

## 2015-08-05 DIAGNOSIS — Z1389 Encounter for screening for other disorder: Secondary | ICD-10-CM | POA: Diagnosis not present

## 2015-08-05 DIAGNOSIS — M47812 Spondylosis without myelopathy or radiculopathy, cervical region: Secondary | ICD-10-CM | POA: Diagnosis not present

## 2015-08-05 DIAGNOSIS — I10 Essential (primary) hypertension: Secondary | ICD-10-CM | POA: Diagnosis not present

## 2015-08-05 DIAGNOSIS — F419 Anxiety disorder, unspecified: Secondary | ICD-10-CM | POA: Diagnosis not present

## 2015-08-05 DIAGNOSIS — M5412 Radiculopathy, cervical region: Secondary | ICD-10-CM | POA: Diagnosis not present

## 2015-08-05 DIAGNOSIS — Z6828 Body mass index (BMI) 28.0-28.9, adult: Secondary | ICD-10-CM | POA: Diagnosis not present

## 2015-08-05 DIAGNOSIS — M62838 Other muscle spasm: Secondary | ICD-10-CM | POA: Diagnosis not present

## 2015-08-07 ENCOUNTER — Ambulatory Visit (HOSPITAL_COMMUNITY)
Admission: RE | Admit: 2015-08-07 | Discharge: 2015-08-07 | Disposition: A | Discharge status: Home / Self Care | Payer: BLUE CROSS/BLUE SHIELD | Source: Ambulatory Visit | Attending: Internal Medicine | Admitting: Internal Medicine

## 2015-08-07 ENCOUNTER — Other Ambulatory Visit (HOSPITAL_COMMUNITY): Payer: Self-pay | Admitting: Internal Medicine

## 2015-08-07 DIAGNOSIS — M542 Cervicalgia: Secondary | ICD-10-CM

## 2015-08-07 DIAGNOSIS — M50322 Other cervical disc degeneration at C5-C6 level: Secondary | ICD-10-CM | POA: Diagnosis not present

## 2015-08-07 DIAGNOSIS — M50321 Other cervical disc degeneration at C4-C5 level: Secondary | ICD-10-CM | POA: Insufficient documentation

## 2015-08-07 DIAGNOSIS — M50323 Other cervical disc degeneration at C6-C7 level: Secondary | ICD-10-CM | POA: Insufficient documentation

## 2015-09-17 DIAGNOSIS — R51 Headache: Secondary | ICD-10-CM | POA: Diagnosis not present

## 2015-09-17 DIAGNOSIS — F419 Anxiety disorder, unspecified: Secondary | ICD-10-CM | POA: Diagnosis not present

## 2015-09-17 DIAGNOSIS — R319 Hematuria, unspecified: Secondary | ICD-10-CM | POA: Diagnosis not present

## 2015-09-17 DIAGNOSIS — R110 Nausea: Secondary | ICD-10-CM | POA: Diagnosis not present

## 2015-09-17 DIAGNOSIS — R509 Fever, unspecified: Secondary | ICD-10-CM | POA: Diagnosis not present

## 2015-09-17 DIAGNOSIS — Z6827 Body mass index (BMI) 27.0-27.9, adult: Secondary | ICD-10-CM | POA: Diagnosis not present

## 2015-09-17 DIAGNOSIS — I10 Essential (primary) hypertension: Secondary | ICD-10-CM | POA: Diagnosis not present

## 2015-09-17 DIAGNOSIS — Z1389 Encounter for screening for other disorder: Secondary | ICD-10-CM | POA: Diagnosis not present

## 2015-09-17 DIAGNOSIS — R945 Abnormal results of liver function studies: Secondary | ICD-10-CM | POA: Diagnosis not present

## 2015-09-17 DIAGNOSIS — R930 Abnormal findings on diagnostic imaging of skull and head, not elsewhere classified: Secondary | ICD-10-CM | POA: Diagnosis not present

## 2015-09-23 DIAGNOSIS — R945 Abnormal results of liver function studies: Secondary | ICD-10-CM | POA: Diagnosis not present

## 2016-08-04 DIAGNOSIS — Z719 Counseling, unspecified: Secondary | ICD-10-CM | POA: Diagnosis not present

## 2016-08-04 DIAGNOSIS — Z0000 Encounter for general adult medical examination without abnormal findings: Secondary | ICD-10-CM | POA: Diagnosis not present

## 2016-08-04 DIAGNOSIS — Z681 Body mass index (BMI) 19 or less, adult: Secondary | ICD-10-CM | POA: Diagnosis not present

## 2016-08-04 DIAGNOSIS — Z1389 Encounter for screening for other disorder: Secondary | ICD-10-CM | POA: Diagnosis not present

## 2016-09-17 DIAGNOSIS — L03115 Cellulitis of right lower limb: Secondary | ICD-10-CM | POA: Diagnosis not present

## 2017-01-12 DIAGNOSIS — N342 Other urethritis: Secondary | ICD-10-CM | POA: Diagnosis not present

## 2017-01-12 DIAGNOSIS — N451 Epididymitis: Secondary | ICD-10-CM | POA: Diagnosis not present

## 2017-02-01 DIAGNOSIS — Z23 Encounter for immunization: Secondary | ICD-10-CM | POA: Diagnosis not present

## 2017-05-31 DIAGNOSIS — J111 Influenza due to unidentified influenza virus with other respiratory manifestations: Secondary | ICD-10-CM | POA: Diagnosis not present

## 2017-05-31 DIAGNOSIS — J209 Acute bronchitis, unspecified: Secondary | ICD-10-CM | POA: Diagnosis not present

## 2017-07-19 DIAGNOSIS — M25812 Other specified joint disorders, left shoulder: Secondary | ICD-10-CM | POA: Diagnosis not present

## 2017-07-19 DIAGNOSIS — Z0001 Encounter for general adult medical examination with abnormal findings: Secondary | ICD-10-CM | POA: Diagnosis not present

## 2017-08-05 DIAGNOSIS — Z1389 Encounter for screening for other disorder: Secondary | ICD-10-CM | POA: Diagnosis not present

## 2017-08-05 DIAGNOSIS — Z6823 Body mass index (BMI) 23.0-23.9, adult: Secondary | ICD-10-CM | POA: Diagnosis not present

## 2017-08-05 DIAGNOSIS — S46912A Strain of unspecified muscle, fascia and tendon at shoulder and upper arm level, left arm, initial encounter: Secondary | ICD-10-CM | POA: Diagnosis not present

## 2017-12-06 ENCOUNTER — Ambulatory Visit (INDEPENDENT_AMBULATORY_CARE_PROVIDER_SITE_OTHER): Payer: BLUE CROSS/BLUE SHIELD | Admitting: Urology

## 2017-12-06 DIAGNOSIS — Z302 Encounter for sterilization: Secondary | ICD-10-CM | POA: Diagnosis not present

## 2018-01-11 DIAGNOSIS — K529 Noninfective gastroenteritis and colitis, unspecified: Secondary | ICD-10-CM | POA: Diagnosis not present

## 2018-01-17 ENCOUNTER — Encounter (INDEPENDENT_AMBULATORY_CARE_PROVIDER_SITE_OTHER): Payer: BLUE CROSS/BLUE SHIELD | Admitting: Urology

## 2018-01-17 DIAGNOSIS — Z302 Encounter for sterilization: Secondary | ICD-10-CM | POA: Diagnosis not present

## 2018-02-28 DIAGNOSIS — Z1389 Encounter for screening for other disorder: Secondary | ICD-10-CM | POA: Diagnosis not present

## 2018-02-28 DIAGNOSIS — Z6826 Body mass index (BMI) 26.0-26.9, adult: Secondary | ICD-10-CM | POA: Diagnosis not present

## 2018-02-28 DIAGNOSIS — I10 Essential (primary) hypertension: Secondary | ICD-10-CM | POA: Diagnosis not present

## 2018-02-28 DIAGNOSIS — E663 Overweight: Secondary | ICD-10-CM | POA: Diagnosis not present

## 2018-02-28 DIAGNOSIS — E782 Mixed hyperlipidemia: Secondary | ICD-10-CM | POA: Diagnosis not present

## 2018-03-08 IMAGING — CR DG CERVICAL SPINE COMPLETE 4+V
6 series · 6 of 6 positions shown · non-contrast
Comparison: Report of a cervical spine MRI of March 01, 2011

CLINICAL DATA: Posterior neck pain after looking up and turning
around

EXAM:
CERVICAL SPINE - COMPLETE 4+ VIEW

[view not recorded (1 of 6)]
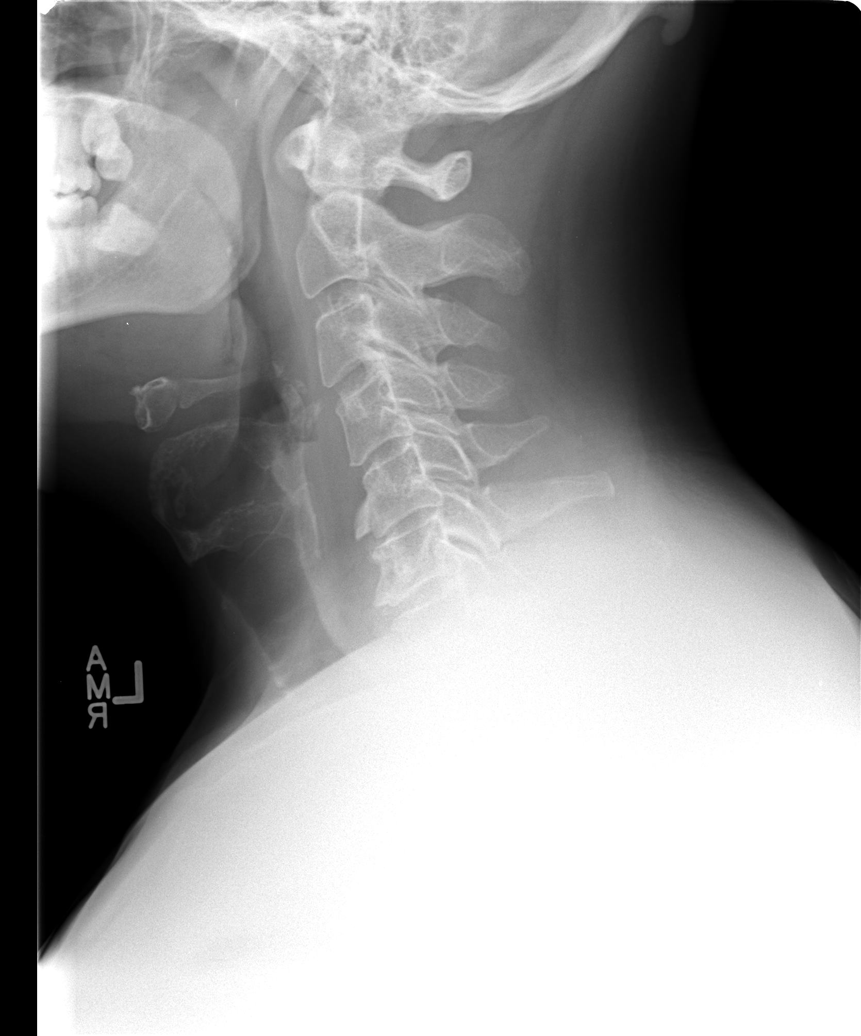

[view not recorded (2 of 6)]
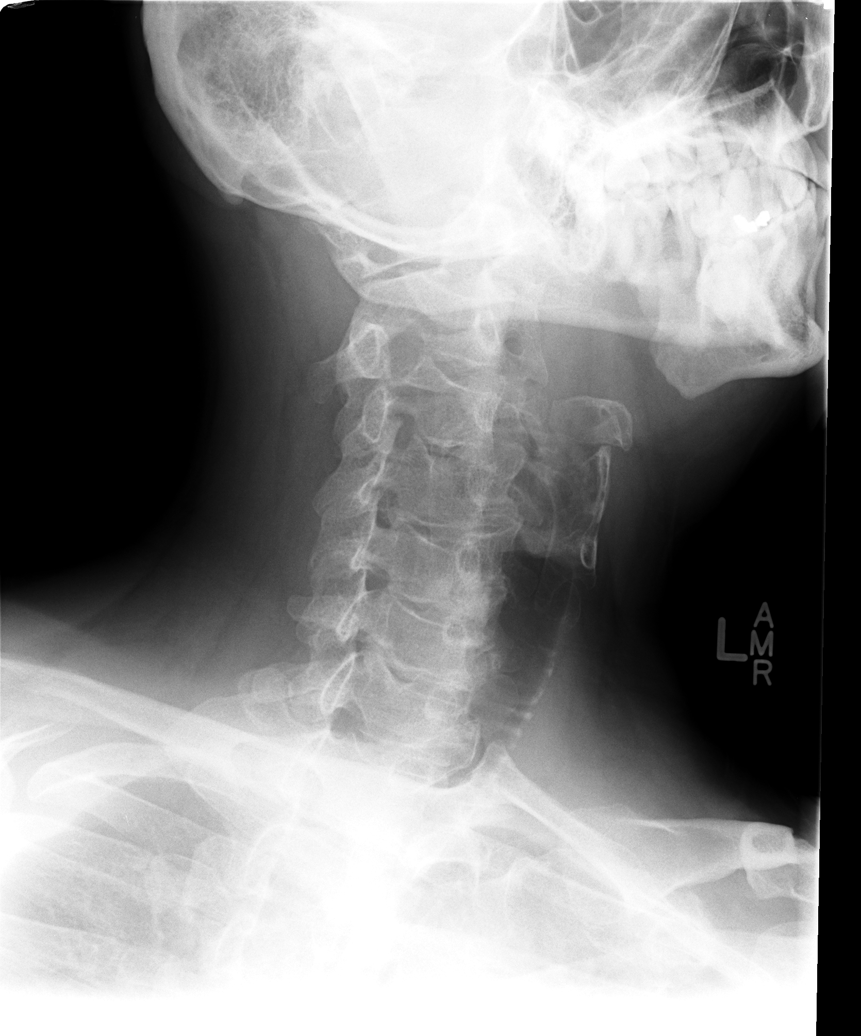

[view not recorded (3 of 6)]
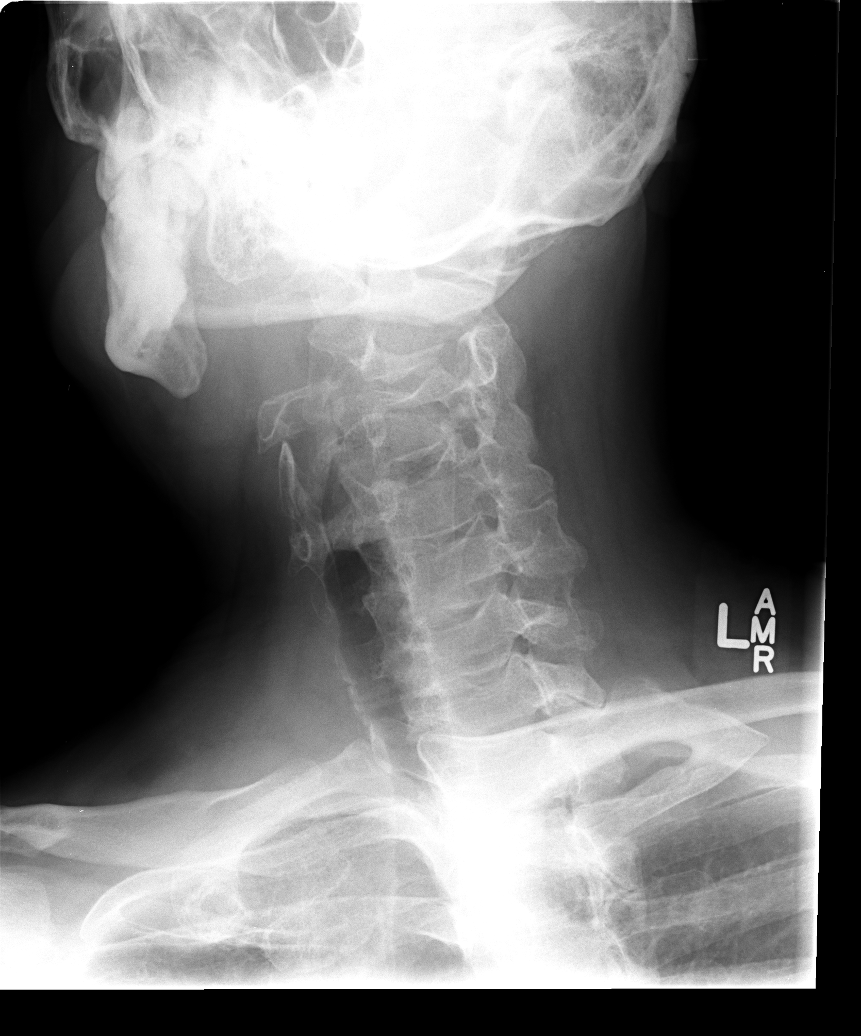

[view not recorded (4 of 6)]
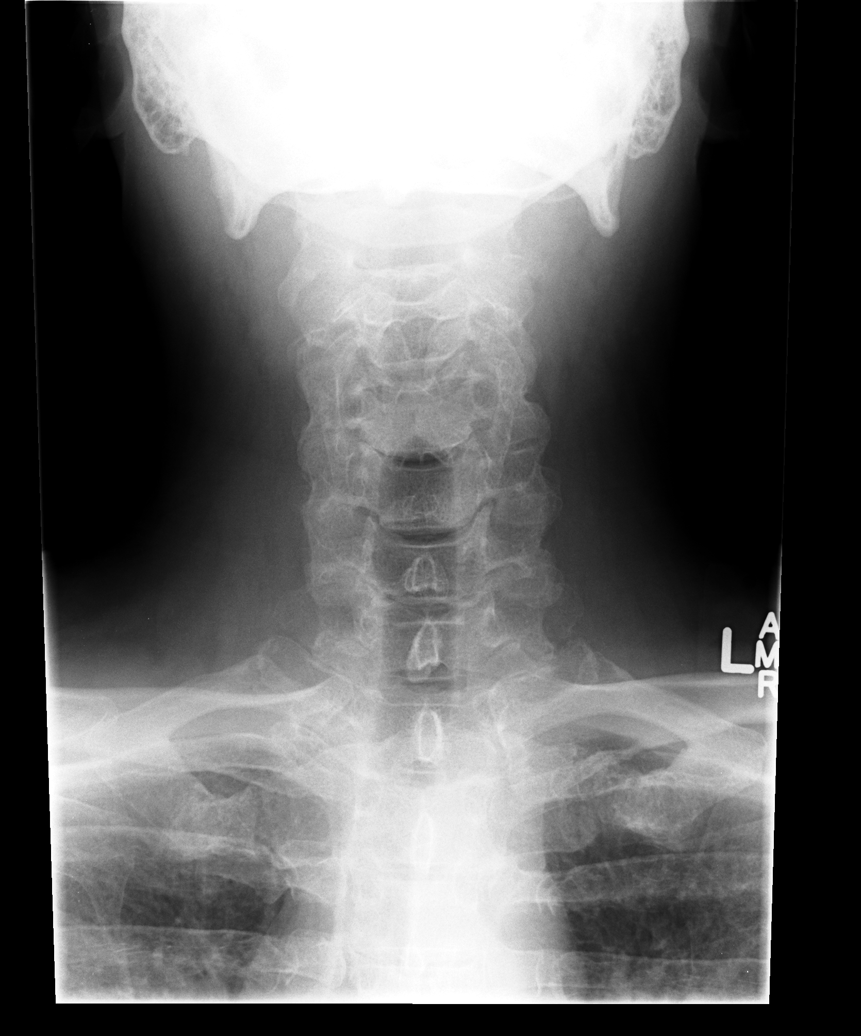

[view not recorded (5 of 6)]
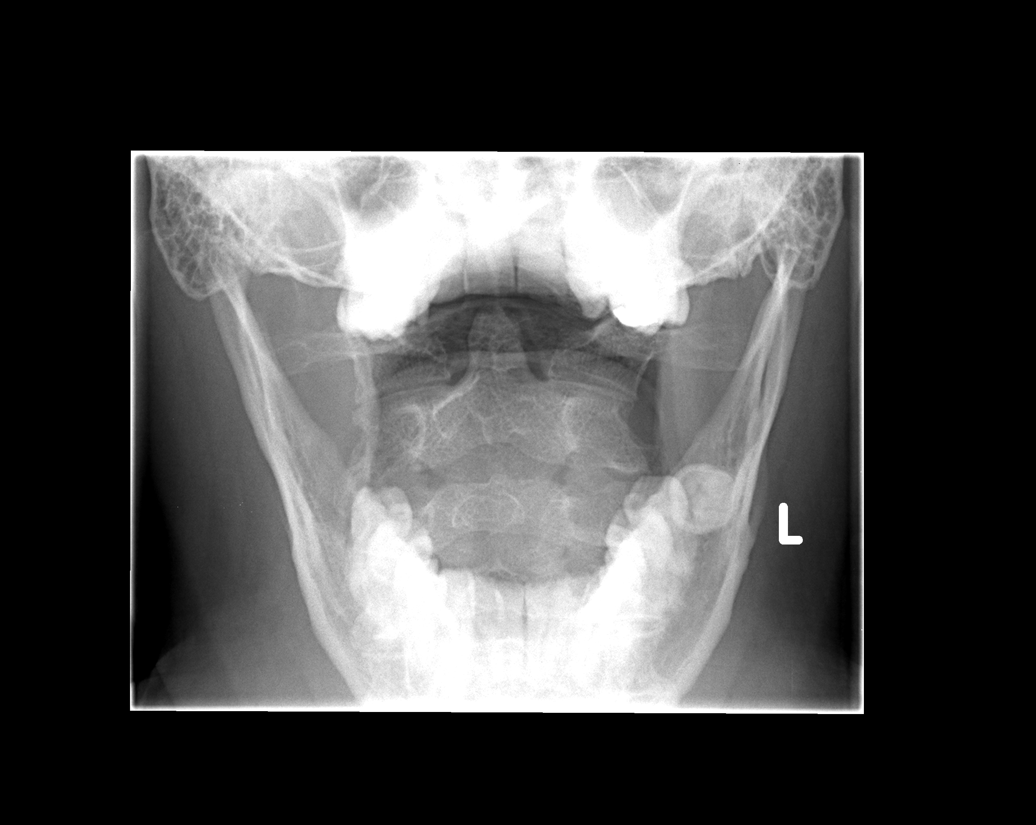

[view not recorded (6 of 6)]
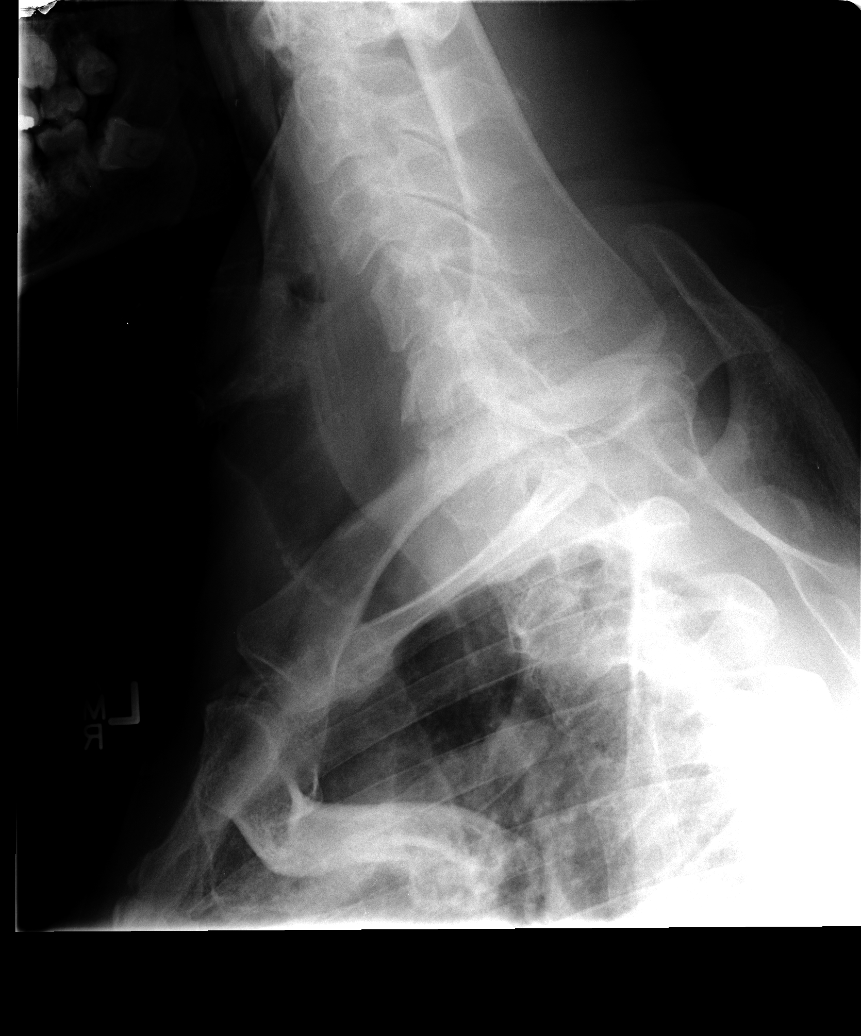

[6 of 6 positions shown; findings below may reference images not displayed]

FINDINGS: The cervical vertebral bodies are preserved in height. There is disc
space narrowing at C5-6 and to a lesser extent at C6-7. There are
anterior endplate osteophytes at this level. There is also mild
narrowing at C3-4. There is no perched facet or spinous process
fracture. The prevertebral soft tissue spaces appear normal. The
oblique views reveal mild multilevel bony encroachment upon the
neural foramina bilaterally. The odontoid is intact.
IMPRESSION: There is degenerative disc disease centered at C5-6 with milder
changes at C4-5 and C6-7. There is multilevel bony encroachment upon
the neural foramina. There is no acute bony abnormality.

## 2018-07-03 DIAGNOSIS — J069 Acute upper respiratory infection, unspecified: Secondary | ICD-10-CM | POA: Diagnosis not present

## 2018-07-03 DIAGNOSIS — J209 Acute bronchitis, unspecified: Secondary | ICD-10-CM | POA: Diagnosis not present

## 2018-08-16 DIAGNOSIS — L03116 Cellulitis of left lower limb: Secondary | ICD-10-CM | POA: Diagnosis not present

## 2018-10-23 ENCOUNTER — Other Ambulatory Visit: Payer: Self-pay | Admitting: Internal Medicine

## 2018-10-23 ENCOUNTER — Other Ambulatory Visit: Payer: Self-pay

## 2018-10-23 DIAGNOSIS — Z20822 Contact with and (suspected) exposure to covid-19: Secondary | ICD-10-CM

## 2018-10-26 LAB — NOVEL CORONAVIRUS, NAA: SARS-CoV-2, NAA: NOT DETECTED

## 2018-12-26 DIAGNOSIS — E663 Overweight: Secondary | ICD-10-CM | POA: Diagnosis not present

## 2018-12-26 DIAGNOSIS — Z6826 Body mass index (BMI) 26.0-26.9, adult: Secondary | ICD-10-CM | POA: Diagnosis not present

## 2018-12-26 DIAGNOSIS — Z1389 Encounter for screening for other disorder: Secondary | ICD-10-CM | POA: Diagnosis not present

## 2018-12-26 DIAGNOSIS — M10071 Idiopathic gout, right ankle and foot: Secondary | ICD-10-CM | POA: Diagnosis not present

## 2019-04-09 DIAGNOSIS — Z1389 Encounter for screening for other disorder: Secondary | ICD-10-CM | POA: Diagnosis not present

## 2019-04-09 DIAGNOSIS — M10071 Idiopathic gout, right ankle and foot: Secondary | ICD-10-CM | POA: Diagnosis not present

## 2019-04-09 DIAGNOSIS — E663 Overweight: Secondary | ICD-10-CM | POA: Diagnosis not present

## 2019-04-09 DIAGNOSIS — Z0000 Encounter for general adult medical examination without abnormal findings: Secondary | ICD-10-CM | POA: Diagnosis not present

## 2019-04-09 DIAGNOSIS — Z6826 Body mass index (BMI) 26.0-26.9, adult: Secondary | ICD-10-CM | POA: Diagnosis not present

## 2019-04-09 DIAGNOSIS — Z23 Encounter for immunization: Secondary | ICD-10-CM | POA: Diagnosis not present

## 2019-06-13 DIAGNOSIS — Z23 Encounter for immunization: Secondary | ICD-10-CM | POA: Diagnosis not present

## 2019-06-14 ENCOUNTER — Ambulatory Visit: Payer: BC Managed Care – PPO | Admitting: Family Medicine

## 2019-06-19 ENCOUNTER — Ambulatory Visit: Payer: BC Managed Care – PPO | Admitting: Family Medicine

## 2019-06-27 ENCOUNTER — Encounter (INDEPENDENT_AMBULATORY_CARE_PROVIDER_SITE_OTHER): Payer: Self-pay | Admitting: *Deleted

## 2019-06-27 ENCOUNTER — Encounter: Payer: Self-pay | Admitting: Cardiology

## 2019-06-27 ENCOUNTER — Other Ambulatory Visit: Payer: Self-pay

## 2019-06-27 ENCOUNTER — Encounter: Payer: Self-pay | Admitting: Family Medicine

## 2019-06-27 ENCOUNTER — Ambulatory Visit (INDEPENDENT_AMBULATORY_CARE_PROVIDER_SITE_OTHER): Payer: BC Managed Care – PPO | Admitting: Family Medicine

## 2019-06-27 ENCOUNTER — Other Ambulatory Visit: Payer: Self-pay | Admitting: Family Medicine

## 2019-06-27 VITALS — BP 152/58 | HR 98 | Temp 97.2°F | Resp 15 | Ht 74.0 in | Wt 202.0 lb

## 2019-06-27 DIAGNOSIS — E559 Vitamin D deficiency, unspecified: Secondary | ICD-10-CM

## 2019-06-27 DIAGNOSIS — Z789 Other specified health status: Secondary | ICD-10-CM | POA: Insufficient documentation

## 2019-06-27 DIAGNOSIS — E663 Overweight: Secondary | ICD-10-CM

## 2019-06-27 DIAGNOSIS — M79672 Pain in left foot: Secondary | ICD-10-CM

## 2019-06-27 DIAGNOSIS — F17210 Nicotine dependence, cigarettes, uncomplicated: Secondary | ICD-10-CM

## 2019-06-27 DIAGNOSIS — Z1211 Encounter for screening for malignant neoplasm of colon: Secondary | ICD-10-CM

## 2019-06-27 DIAGNOSIS — Z8739 Personal history of other diseases of the musculoskeletal system and connective tissue: Secondary | ICD-10-CM | POA: Diagnosis not present

## 2019-06-27 DIAGNOSIS — F1090 Alcohol use, unspecified, uncomplicated: Secondary | ICD-10-CM

## 2019-06-27 DIAGNOSIS — R9431 Abnormal electrocardiogram [ECG] [EKG]: Secondary | ICD-10-CM

## 2019-06-27 DIAGNOSIS — I517 Cardiomegaly: Secondary | ICD-10-CM

## 2019-06-27 DIAGNOSIS — I10 Essential (primary) hypertension: Secondary | ICD-10-CM

## 2019-06-27 DIAGNOSIS — Z7289 Other problems related to lifestyle: Secondary | ICD-10-CM | POA: Insufficient documentation

## 2019-06-27 DIAGNOSIS — I259 Chronic ischemic heart disease, unspecified: Secondary | ICD-10-CM | POA: Diagnosis not present

## 2019-06-27 DIAGNOSIS — M79671 Pain in right foot: Secondary | ICD-10-CM

## 2019-06-27 DIAGNOSIS — I493 Ventricular premature depolarization: Secondary | ICD-10-CM

## 2019-06-27 DIAGNOSIS — I4510 Unspecified right bundle-branch block: Secondary | ICD-10-CM

## 2019-06-27 DIAGNOSIS — I998 Other disorder of circulatory system: Secondary | ICD-10-CM | POA: Diagnosis not present

## 2019-06-27 DIAGNOSIS — E785 Hyperlipidemia, unspecified: Secondary | ICD-10-CM | POA: Insufficient documentation

## 2019-06-27 HISTORY — DX: Abnormal electrocardiogram (ECG) (EKG): R94.31

## 2019-06-27 HISTORY — DX: Encounter for screening for malignant neoplasm of colon: Z12.11

## 2019-06-27 HISTORY — DX: Other specified health status: Z78.9

## 2019-06-27 HISTORY — DX: Personal history of other diseases of the musculoskeletal system and connective tissue: Z87.39

## 2019-06-27 HISTORY — DX: Alcohol use, unspecified, uncomplicated: F10.90

## 2019-06-27 HISTORY — DX: Other problems related to lifestyle: Z72.89

## 2019-06-27 NOTE — Assessment & Plan Note (Signed)
Strongly counseled and encouraged to discontinue use of alcohol.

## 2019-06-27 NOTE — Assessment & Plan Note (Signed)
New finding on EKG. checking mag.

## 2019-06-27 NOTE — Assessment & Plan Note (Signed)
Getting updated labs.  Will need to have some adjustment to medications once we get findings.

## 2019-06-27 NOTE — Progress Notes (Signed)
Cardiology Office Note  Date: 06/28/2019   ID: Donald [name], DOB Mar 20, 1966, MRN [id number]  PCP:  Donald Mayo, NP  Consulting Cardiologist: Donald Sark, MD Electrophysiologist:  None   Chief Complaint  Patient presents with  . Referred with abnormal ECG    History of Present Illness: Donald [name] is a 54 y.o. male referred for cardiology consultation by Ms. Jerelene Redden NP due to abnormal ECG.  He actually just established as a new patient at [facility] primary care yesterday, he was a former patient of [place].  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  States labor can be physical at times, he does not report any exertional chest pain or unusual shortness of breath, no palpitations or syncope. When he established with PCP yesterday, he had no active concerns or new exertional symptoms.  I personally reviewed the tracing from 06/27/2019 which shows normal sinus rhythm with PVCs, right bundle branch block, inferior and anterolateral ST-T wave abnormalities which may be repolarization changes in the setting of increased voltage.  No definite inferior infarct pattern.  There is no old tracing for comparison.  This tracing was obtained in the absence of any active cardiac symptoms based on review of the office note.  He was told yesterday that he had a heart murmur as well, has not been aware of this over time.  He does report a history of hypertriglyceridemia with levels in the 500s years ago, although through dietary changes and weight loss, also taking over-the-counter omega-3 supplements, this is come down.  Follow-up lab work is pending.  He has been on Lipitor as well.  He does not report any family history of premature CAD or sudden cardiac death.  We discussed smoking cessation, he has quit in the past and does seem motivated to try again.  Past Medical History:  Diagnosis Date  . Bone tumor 11/22/2013  . Glenoid labral tear  10/27/2010  . Hyperlipidemia   . Rotator cuff tear, right 10/27/2010  . Shoulder injury 10/27/2010    Past Surgical History:  Procedure Laterality Date  . BACK SURGERY    . Benign tumor     Left shin  . HERNIA REPAIR    . SHOULDER SURGERY Left   . SHOULDER SURGERY Right   . Thumb surgery Left x3    Current Outpatient Medications  Medication Sig Dispense Refill  . atorvastatin (LIPITOR) 10 MG tablet Take 10 mg by mouth daily.    . Multiple Vitamin (MULTIVITAMIN PO) Take 1 tablet by mouth daily.      No current facility-administered medications for this visit.   Allergies:  Ultram [tramadol hcl]   Social History: The patient  reports that he has been smoking cigarettes. He has been smoking about 0.50 packs per day. He has never used smokeless tobacco. He reports current alcohol use of about 6.0 standard drinks of alcohol per week. He reports that he does not use drugs.   Family History: The patient's family history includes Other in his father.   ROS:   No lightheadedness or syncope.  Physical Exam: VS:  BP (!) 130/58 (BP Location: Right Arm)   Pulse 88   Temp 98.9 F (37.2 C)   Ht 6\' 2"  (1.88 m)   Wt 197 lb (89.4 kg)   SpO2 98%   BMI 25.29 kg/m , BMI Body mass index is 25.29 kg/m.  Wt Readings from Last 3 Encounters:  06/28/19 197  lb (89.4 kg)  06/27/19 202 lb (91.6 kg)  11/22/13 206 lb (93.4 kg)    General: Patient appears comfortable at rest. HEENT: Conjunctiva and lids normal, wearing a mask. Neck: Supple, no elevated JVP or carotid bruits, no thyromegaly. Lungs: Clear to auscultation, nonlabored breathing at rest. Cardiac: Regular rate and rhythm, no S3, soft systolic murmur with 3/6 diastolic murmur left sternal border suggesting aortic regurgitation, no pericardial rub. Abdomen: Soft, nontender, bowel sounds present. Extremities: No pitting edema, distal pulses 2+. Skin: Warm and dry. Musculoskeletal: No kyphosis. Neuropsychiatric: Alert and oriented x3,  affect grossly appropriate.  ECG:  No old tracings available for review.  Recent Labwork: 06/27/2019: ALT 23; AST 21; BUN 25; Creat 0.86; Hemoglobin 14.9; Platelets 193; Potassium 4.9; Sodium 137   Other Studies Reviewed Today:  No prior cardiac testing available for review.  Assessment and Plan:  1.  Abnormal ECG in the absence of active cardiac symptoms.  No old tracing for comparison.  At this point I suspect this might be related to strain and repolarization abnormalities, heart murmur is suggestive of aortic regurgitation which could be a contributor.  He does not have any known history of valvular heart disease.  He does not describe any exertional limitations at this point.  Echocardiogram was ordered by his PCP, pending for later this morning.  A battery of lab work was also sent, most of which is still pending.  Plan will be to review his echocardiogram and lab results and determine next step.  2.  Tobacco abuse.  We discussed smoking cessation.  3.  Mixed hyperlipidemia, history sounds most consistent with hypertriglyceridemia.  He has been on Lipitor and also takes over-the-counter omega-3 supplements.  Follow-up lab work pending.   Medication Adjustments/Labs and Tests Ordered: Current medicines are reviewed at length with the patient today.  Concerns regarding medicines are outlined above.   Tests Ordered: No orders of the defined types were placed in this encounter.   Medication Changes: No orders of the defined types were placed in this encounter.   Disposition:  Follow up test results and determine next.  Signed, Donald Sark, MD, [facility] 06/28/2019 10:18 AM    [facility] [place]. [address] [place], [place] [address] Phone: [phone]; Fax: [phone]

## 2019-06-27 NOTE — Assessment & Plan Note (Signed)
Needed to get screening for colonoscopy GI referral ordered today.  Appreciate collaboration in his care.

## 2019-06-27 NOTE — Assessment & Plan Note (Signed)
He reports a 30 pound weight loss he was congratulated on this and advised to continue.  He is almost in normal BMI range.  This will only benefit him and his current other cardiovascular risk factors.
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
## 2019-06-27 NOTE — Assessment & Plan Note (Signed)
New finding on EKG.  Reviewed with Dr. Tamala Julian in cardiology and Dr. Moshe Cipro.  Echo ordered labs ordered will follow up with him has referral to cardiology as well. I have counseled him on the reduction of smoking, alcohol use, diet control in addition to increasing cholesterol medications.  Also will be checking A1c and trying to make sure that he has all of his preventative measures in place.   Reviewed side effects, risks and benefits of medication.   Patient acknowledged agreement and understanding of the plan.

## 2019-06-27 NOTE — Assessment & Plan Note (Signed)
Checking levels to make sure that he does not have any elevated uric acid.

## 2019-06-27 NOTE — Assessment & Plan Note (Signed)
Blood pressure is 138/60 on recheck, previous reading was 150/58 when he first arrived.  He does not take any medication for BP control. He is encouraged to maintain a low-salt diet.  Once labs have come back we will be putting him on something to help with BP control.

## 2019-06-27 NOTE — Assessment & Plan Note (Signed)
Strongly counseled and encouraged to stop smoking.

## 2019-06-27 NOTE — Assessment & Plan Note (Signed)
Vitamin D level ordered.

## 2019-06-27 NOTE — Patient Instructions (Addendum)
I appreciate the opportunity to provide you with care for your health and wellness. Today we discussed: establish care  Follow up: 1 month  Labs placed today Fasting labs tomorrow   Referrals today: colonoscopy, podiatry for heel pain, and Cardiology for EKG changes  Please continue to practice social distancing to keep you, your family, and our community safe.  If you must go out, please wear a mask and practice good handwashing.  It was a pleasure to see you and I look forward to continuing to work together on your health and well-being. Please do not hesitate to call the office if you need care or have questions about your care.  Have a wonderful day and week. With Gratitude, Cherly Beach, DNP, AGNP-BC

## 2019-06-27 NOTE — H&P (View-Only) (Signed)
Cardiology Office Note  Date: 06/28/2019   ID: [name], DOB [birth year], MRN [id number]  PCP:  Perlie Mayo, NP  Consulting Cardiologist: Satira Sark, MD Electrophysiologist:  None   Chief Complaint  Patient presents with  . Referred with abnormal ECG    History of Present Illness: Donald [name] is a 54 y.o. male referred for cardiology consultation by Ms. Jerelene Redden NP due to abnormal ECG.  He actually just established as a new patient at [facility] primary care yesterday, he was a former patient of [place].  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  States labor can be physical at times, he does not report any exertional chest pain or unusual shortness of breath, no palpitations or syncope. When he established with PCP yesterday, he had no active concerns or new exertional symptoms.  I personally reviewed the tracing from 06/27/2019 which shows normal sinus rhythm with PVCs, right bundle branch block, inferior and anterolateral ST-T wave abnormalities which may be repolarization changes in the setting of increased voltage.  No definite inferior infarct pattern.  There is no old tracing for comparison.  This tracing was obtained in the absence of any active cardiac symptoms based on review of the office note.  He was told yesterday that he had a heart murmur as well, has not been aware of this over time.  He does report a history of hypertriglyceridemia with levels in the 500s years ago, although through dietary changes and weight loss, also taking over-the-counter omega-3 supplements, this is come down.  Follow-up lab work is pending.  He has been on Lipitor as well.  He does not report any family history of premature CAD or sudden cardiac death.  We discussed smoking cessation, he has quit in the past and does seem motivated to try again.  Past Medical History:  Diagnosis Date  . Bone tumor 11/22/2013  . Glenoid labral tear  10/27/2010  . Hyperlipidemia   . Rotator cuff tear, right 10/27/2010  . Shoulder injury 10/27/2010    Past Surgical History:  Procedure Laterality Date  . BACK SURGERY    . Benign tumor     Left shin  . HERNIA REPAIR    . SHOULDER SURGERY Left   . SHOULDER SURGERY Right   . Thumb surgery Left x3    Current Outpatient Medications  Medication Sig Dispense Refill  . atorvastatin (LIPITOR) 10 MG tablet Take 10 mg by mouth daily.    . Multiple Vitamin (MULTIVITAMIN PO) Take 1 tablet by mouth daily.      No current facility-administered medications for this visit.   Allergies:  Ultram [tramadol hcl]   Social History: The patient  reports that he has been smoking cigarettes. He has been smoking about 0.50 packs per day. He has never used smokeless tobacco. He reports current alcohol use of about 6.0 standard drinks of alcohol per week. He reports that he does not use drugs.   Family History: The patient's family history includes Other in his father.   ROS:   No lightheadedness or syncope.  Physical Exam: VS:  BP (!) 130/58 (BP Location: Right Arm)   Pulse 88   Temp 98.9 F (37.2 C)   Ht 6\' 2"  (1.88 m)   Wt 197 lb (89.4 kg)   SpO2 98%   BMI 25.29 kg/m , BMI Body mass index is 25.29 kg/m.  Wt Readings from Last 3 Encounters:  06/28/19 197  lb (89.4 kg)  06/27/19 202 lb (91.6 kg)  11/22/13 206 lb (93.4 kg)    General: Patient appears comfortable at rest. HEENT: Conjunctiva and lids normal, wearing a mask. Neck: Supple, no elevated JVP or carotid bruits, no thyromegaly. Lungs: Clear to auscultation, nonlabored breathing at rest. Cardiac: Regular rate and rhythm, no S3, soft systolic murmur with 3/6 diastolic murmur left sternal border suggesting aortic regurgitation, no pericardial rub. Abdomen: Soft, nontender, bowel sounds present. Extremities: No pitting edema, distal pulses 2+. Skin: Warm and dry. Musculoskeletal: No kyphosis. Neuropsychiatric: Alert and oriented x3,  affect grossly appropriate.  ECG:  No old tracings available for review.  Recent Labwork: 06/27/2019: ALT 23; AST 21; BUN 25; Creat 0.86; Hemoglobin 14.9; Platelets 193; Potassium 4.9; Sodium 137   Other Studies Reviewed Today:  No prior cardiac testing available for review.  Assessment and Plan:  1.  Abnormal ECG in the absence of active cardiac symptoms.  No old tracing for comparison.  At this point I suspect this might be related to strain and repolarization abnormalities, heart murmur is suggestive of aortic regurgitation which could be a contributor.  He does not have any known history of valvular heart disease.  He does not describe any exertional limitations at this point.  Echocardiogram was ordered by his PCP, pending for later this morning.  A battery of lab work was also sent, most of which is still pending.  Plan will be to review his echocardiogram and lab results and determine next step.  2.  Tobacco abuse.  We discussed smoking cessation.  3.  Mixed hyperlipidemia, history sounds most consistent with hypertriglyceridemia.  He has been on Lipitor and also takes over-the-counter omega-3 supplements.  Follow-up lab work pending.   Medication Adjustments/Labs and Tests Ordered: Current medicines are reviewed at length with the patient today.  Concerns regarding medicines are outlined above.   Tests Ordered: No orders of the defined types were placed in this encounter.   Medication Changes: No orders of the defined types were placed in this encounter.   Disposition:  Follow up test results and determine next.  Signed, Satira Sark, MD, [facility] 06/28/2019 10:18 AM    [facility]. [address] [place], [place] [address] Phone: [phone]; Fax: [phone]

## 2019-06-27 NOTE — Assessment & Plan Note (Signed)
Podiatry referral ordered.  For bilateral heel pain.  Might be related to neuropathic pain or plantar fasciitis.

## 2019-06-28 ENCOUNTER — Other Ambulatory Visit: Payer: Self-pay | Admitting: Cardiology

## 2019-06-28 ENCOUNTER — Telehealth: Payer: Self-pay | Admitting: Cardiology

## 2019-06-28 ENCOUNTER — Encounter: Payer: Self-pay | Admitting: Cardiology

## 2019-06-28 ENCOUNTER — Ambulatory Visit (HOSPITAL_COMMUNITY)
Admission: RE | Admit: 2019-06-28 | Discharge: 2019-06-28 | Disposition: A | Discharge status: Home / Self Care | Payer: BC Managed Care – PPO | Source: Ambulatory Visit | Attending: Family Medicine | Admitting: Family Medicine

## 2019-06-28 ENCOUNTER — Ambulatory Visit (INDEPENDENT_AMBULATORY_CARE_PROVIDER_SITE_OTHER): Payer: BC Managed Care – PPO | Admitting: Cardiology

## 2019-06-28 ENCOUNTER — Other Ambulatory Visit (HOSPITAL_COMMUNITY): Payer: BC Managed Care – PPO

## 2019-06-28 VITALS — BP 130/58 | HR 88 | Temp 98.9°F | Ht 74.0 in | Wt 197.0 lb

## 2019-06-28 DIAGNOSIS — I4510 Unspecified right bundle-branch block: Secondary | ICD-10-CM | POA: Diagnosis not present

## 2019-06-28 DIAGNOSIS — E785 Hyperlipidemia, unspecified: Secondary | ICD-10-CM | POA: Diagnosis not present

## 2019-06-28 DIAGNOSIS — I493 Ventricular premature depolarization: Secondary | ICD-10-CM | POA: Diagnosis not present

## 2019-06-28 DIAGNOSIS — I517 Cardiomegaly: Secondary | ICD-10-CM | POA: Diagnosis not present

## 2019-06-28 DIAGNOSIS — R9431 Abnormal electrocardiogram [ECG] [EKG]: Secondary | ICD-10-CM | POA: Insufficient documentation

## 2019-06-28 DIAGNOSIS — R011 Cardiac murmur, unspecified: Secondary | ICD-10-CM | POA: Diagnosis not present

## 2019-06-28 DIAGNOSIS — Z720 Tobacco use: Secondary | ICD-10-CM | POA: Diagnosis not present

## 2019-06-28 DIAGNOSIS — E782 Mixed hyperlipidemia: Secondary | ICD-10-CM

## 2019-06-28 DIAGNOSIS — I351 Nonrheumatic aortic (valve) insufficiency: Secondary | ICD-10-CM

## 2019-06-28 LAB — CBC
HCT: 43.1 % (ref 38.5–50.0)
Hemoglobin: 14.9 g/dL (ref 13.2–17.1)
MCH: 32 pg (ref 27.0–33.0)
MCHC: 34.6 g/dL (ref 32.0–36.0)
MCV: 92.7 fL (ref 80.0–100.0)
MPV: 10.3 fL (ref 7.5–12.5)
Platelets: 193 10*3/uL (ref 140–400)
RBC: 4.65 10*6/uL (ref 4.20–5.80)
RDW: 11.7 % (ref 11.0–15.0)
WBC: 6.4 10*3/uL (ref 3.8–10.8)

## 2019-06-28 LAB — MAGNESIUM: Magnesium: 2.1 mg/dL (ref 1.5–2.5)

## 2019-06-28 LAB — COMPLETE METABOLIC PANEL WITH GFR
AG Ratio: 1.8 (calc) (ref 1.0–2.5)
ALT: 23 U/L (ref 9–46)
AST: 21 U/L (ref 10–35)
Albumin: 4.2 g/dL (ref 3.6–5.1)
Alkaline phosphatase (APISO): 65 U/L (ref 35–144)
BUN: 25 mg/dL (ref 7–25)
CO2: 27 mmol/L (ref 20–32)
Calcium: 9.3 mg/dL (ref 8.6–10.3)
Chloride: 104 mmol/L (ref 98–110)
Creat: 0.86 mg/dL (ref 0.70–1.33)
GFR, Est African American: 116 mL/min/{1.73_m2} (ref >=60)
GFR, Est Non African American: 100 mL/min/{1.73_m2} (ref >=60)
Globulin: 2.3 g/dL (calc) (ref 1.9–3.7)
Glucose, Bld: 94 mg/dL (ref 65–99)
Potassium: 4.9 mmol/L (ref 3.5–5.3)
Sodium: 137 mmol/L (ref 135–146)
Total Bilirubin: 0.4 mg/dL (ref 0.2–1.2)
Total Protein: 6.5 g/dL (ref 6.1–8.1)

## 2019-06-28 LAB — LIPID PANEL
Cholesterol: 174 mg/dL
HDL: 53 mg/dL
LDL Cholesterol (Calc): 76 mg/dL
Non-HDL Cholesterol (Calc): 121 mg/dL
Total CHOL/HDL Ratio: 3.3 (calc)
Triglycerides: 375 mg/dL — ABNORMAL HIGH

## 2019-06-28 LAB — ECHOCARDIOGRAM COMPLETE
Height: 74 in
Weight: 3152 [oz_av]

## 2019-06-28 LAB — TEST AUTHORIZATION

## 2019-06-28 NOTE — Telephone Encounter (Signed)
TEE scheduled for 3/11 at 930 am, covid and pre-op on 3/9 at 230 pm and 315 pm. Patient aware and is arranging child care

## 2019-06-28 NOTE — Patient Instructions (Signed)
Medication Instructions:  Your physician recommends that you continue on your current medications as directed. Please refer to the Current Medication list given to you today.  *If you need a refill on your cardiac medications before your next appointment, please call your pharmacy*   Lab Work: none If you have labs (blood work) drawn today and your tests are completely normal, you will receive your results only by: Marland Kitchen MyChart Message (if you have MyChart) OR . A paper copy in the mail If you have any lab test that is abnormal or we need to change your treatment, we will call you to review the results.   Testing/Procedures:   Get Echo today at 1130   Follow-Up: At [facility], you and your health needs are our priority.  As part of our continuing mission to provide you with exceptional heart care, we have created designated Provider Care Teams.  These Care Teams include your primary Cardiologist (physician) and Advanced Practice Providers (APPs -  Physician Assistants and Nurse Practitioners) who all work together to provide you with the care you need, when you need it.  We recommend signing up for the patient portal called "MyChart".  Sign up information is provided on this After Visit Summary.  MyChart is used to connect with patients for Virtual Visits (Telemedicine).  Patients are able to view lab/test results, encounter notes, upcoming appointments, etc.  Non-urgent messages can be sent to your provider as well.   To learn more about what you can do with MyChart, go to [email or web address].      We will call you with your results and determine your follow up.      Thank you for choosing [facility] !

## 2019-06-28 NOTE — Telephone Encounter (Signed)
I located the patient's echocardiogram report from today, sent back to PCP.  I talked with him today in the office about the likelihood that he had aortic regurgitation based on his examination.  This is in fact the case, looks to have moderate to severe aortic regurgitation with prolapse of cusp of aortic valve.  Unfortunately, this looks to have also resulted in dilatation of the left ventricle and LV dysfunction with LVEF 30 to 35%.  I suspect this has been going on more chronically and just recently recognized.  Please let him know and let's get him scheduled for a TEE next week when I am working in the hospital, please get this set up with propofol per anesthesia.  This will help [place] evaluate the valve in more detail and then we can determine how best to proceed.

## 2019-06-28 NOTE — Progress Notes (Signed)
*  PRELIMINARY RESULTS* Echocardiogram 2D Echocardiogram has been performed.  Donald [name] 06/28/2019, 11:48 AM

## 2019-07-03 ENCOUNTER — Other Ambulatory Visit: Payer: Self-pay

## 2019-07-03 ENCOUNTER — Encounter (HOSPITAL_COMMUNITY)
Admission: RE | Admit: 2019-07-03 | Discharge: 2019-07-03 | Disposition: A | Discharge status: Home / Self Care | Payer: BC Managed Care – PPO | Source: Ambulatory Visit | Attending: Cardiology | Admitting: Cardiology

## 2019-07-03 ENCOUNTER — Other Ambulatory Visit (HOSPITAL_COMMUNITY)
Admission: RE | Admit: 2019-07-03 | Discharge: 2019-07-03 | Disposition: A | Discharge status: Home / Self Care | Payer: BC Managed Care – PPO | Source: Ambulatory Visit | Attending: Cardiology | Admitting: Cardiology

## 2019-07-03 DIAGNOSIS — Z01812 Encounter for preprocedural laboratory examination: Secondary | ICD-10-CM | POA: Insufficient documentation

## 2019-07-03 DIAGNOSIS — Z20822 Contact with and (suspected) exposure to covid-19: Secondary | ICD-10-CM | POA: Diagnosis not present

## 2019-07-04 LAB — SARS CORONAVIRUS 2 (TAT 6-24 HRS): SARS Coronavirus 2: NEGATIVE

## 2019-07-05 ENCOUNTER — Ambulatory Visit (HOSPITAL_COMMUNITY): Payer: BC Managed Care – PPO | Admitting: Anesthesiology

## 2019-07-05 ENCOUNTER — Ambulatory Visit (HOSPITAL_BASED_OUTPATIENT_CLINIC_OR_DEPARTMENT_OTHER): Payer: BC Managed Care – PPO

## 2019-07-05 ENCOUNTER — Encounter (HOSPITAL_COMMUNITY): Payer: Self-pay | Admitting: Cardiology

## 2019-07-05 ENCOUNTER — Encounter (HOSPITAL_COMMUNITY)
Admission: RE | Disposition: A | Discharge status: Home / Self Care | Payer: Self-pay | Source: Home / Self Care | Attending: Cardiology

## 2019-07-05 ENCOUNTER — Ambulatory Visit (HOSPITAL_COMMUNITY)
Admission: RE | Admit: 2019-07-05 | Discharge: 2019-07-05 | Disposition: A | Discharge status: Home / Self Care | Payer: BC Managed Care – PPO | Attending: Cardiology | Admitting: Cardiology

## 2019-07-05 DIAGNOSIS — I340 Nonrheumatic mitral (valve) insufficiency: Secondary | ICD-10-CM

## 2019-07-05 DIAGNOSIS — F17210 Nicotine dependence, cigarettes, uncomplicated: Secondary | ICD-10-CM | POA: Insufficient documentation

## 2019-07-05 DIAGNOSIS — E782 Mixed hyperlipidemia: Secondary | ICD-10-CM | POA: Insufficient documentation

## 2019-07-05 DIAGNOSIS — I10 Essential (primary) hypertension: Secondary | ICD-10-CM | POA: Diagnosis not present

## 2019-07-05 DIAGNOSIS — Q211 Atrial septal defect: Secondary | ICD-10-CM | POA: Diagnosis not present

## 2019-07-05 DIAGNOSIS — I351 Nonrheumatic aortic (valve) insufficiency: Secondary | ICD-10-CM | POA: Diagnosis not present

## 2019-07-05 DIAGNOSIS — R9431 Abnormal electrocardiogram [ECG] [EKG]: Secondary | ICD-10-CM | POA: Diagnosis not present

## 2019-07-05 HISTORY — PX: BUBBLE STUDY: SHX6837

## 2019-07-05 HISTORY — PX: TEE WITHOUT CARDIOVERSION: SHX5443

## 2019-07-05 SURGERY — ECHOCARDIOGRAM, TRANSESOPHAGEAL
Anesthesia: General

## 2019-07-05 MED ORDER — PROPOFOL 10 MG/ML IV BOLUS
INTRAVENOUS | Status: DC | PRN
  Administered 2019-07-05 (×5): 20 mg via INTRAVENOUS

## 2019-07-05 MED ORDER — KETAMINE HCL 50 MG/5ML IJ SOSY
PREFILLED_SYRINGE | INTRAMUSCULAR | Status: AC
  Filled 2019-07-05: qty 5

## 2019-07-05 MED ORDER — PROPOFOL 10 MG/ML IV BOLUS
INTRAVENOUS | Status: AC
  Filled 2019-07-05: qty 40

## 2019-07-05 MED ORDER — LIDOCAINE HCL (CARDIAC) PF 50 MG/5ML IV SOSY
PREFILLED_SYRINGE | INTRAVENOUS | Status: DC | PRN
  Administered 2019-07-05: 60 mg via INTRAVENOUS

## 2019-07-05 MED ORDER — LACTATED RINGERS IV SOLN: INTRAVENOUS | Status: DC

## 2019-07-05 MED ORDER — PROPOFOL 500 MG/50ML IV EMUL
INTRAVENOUS | Status: DC | PRN
  Administered 2019-07-05: 150 ug/kg/min via INTRAVENOUS

## 2019-07-05 MED ORDER — SODIUM CHLORIDE 0.9 % IV SOLN: INTRAVENOUS | Status: DC

## 2019-07-05 NOTE — Anesthesia Postprocedure Evaluation (Signed)
Anesthesia Post Note  Patient: Donald [name]  Procedure(s) Performed: TRANSESOPHAGEAL ECHOCARDIOGRAM (TEE) WITH PROPOFOL (N/A )  Patient location during evaluation: PACU Anesthesia Type: General Level of consciousness: awake and alert and oriented Pain management: pain level controlled Vital Signs Assessment: post-procedure vital signs reviewed and stable Respiratory status: spontaneous breathing Cardiovascular status: blood pressure returned to baseline and stable Postop Assessment: no apparent nausea or vomiting Anesthetic complications: no     Last Vitals:  Vitals:   07/05/19 0822 07/05/19 1010  BP: 130/61 (!) (P) 89/36  Pulse: 78   Resp: 18 (P) 18  Temp: 36.7 C 36.8 C  SpO2: 98% (P) 96%    Last Pain:  Vitals:   07/05/19 0822  TempSrc: Oral                 Sherria Riemann

## 2019-07-05 NOTE — Transfer of Care (Signed)
Immediate Anesthesia Transfer of Care Note  Patient: Donald [name]  Procedure(s) Performed: TRANSESOPHAGEAL ECHOCARDIOGRAM (TEE) WITH PROPOFOL (N/A )  Patient Location: PACU  Anesthesia Type:General  Level of Consciousness: awake  Airway & Oxygen Therapy: Patient Spontanous Breathing  Post-op Assessment: Report given to RN  Post vital signs: Reviewed  Last Vitals:  Vitals Value Taken Time  BP    Temp 36.8 C 07/05/19 1010  Pulse 66 07/05/19 1013  Resp 20 07/05/19 1013  SpO2 80 % 07/05/19 1013  Vitals shown include unvalidated device data.  Last Pain:  Vitals:   07/05/19 0822  TempSrc: Oral      Patients Stated Pain Goal: 5 ([id number])  Complications: No apparent anesthesia complications

## 2019-07-05 NOTE — Discharge Instructions (Signed)
CARDIOLOGY WILL BE IN TOUCH WITH YOU WITH FOLLOW UP APPOINTMENTS MAY ALSO RESUME CURRENT MEDICATIONS      Monitored Anesthesia Care, Care After These instructions provide you with information about caring for yourself after your procedure. Your health care provider may also give you more specific instructions. Your treatment has been planned according to current medical practices, but problems sometimes occur. Call your health care provider if you have any problems or questions after your procedure. What can I expect after the procedure? After your procedure, you may:  Feel sleepy for several hours.  Feel clumsy and have poor balance for several hours.  Feel forgetful about what happened after the procedure.  Have poor judgment for several hours.  Feel nauseous or vomit.  Have a sore throat if you had a breathing tube during the procedure. Follow these instructions at home: For at least 24 hours after the procedure:      Have a responsible adult stay with you. It is important to have someone help care for you until you are awake and alert.  Rest as needed.  Do not: ? Participate in activities in which you could fall or become injured. ? Drive. ? Use heavy machinery. ? Drink alcohol. ? Take sleeping pills or medicines that cause drowsiness. ? Make important decisions or sign legal documents. ? Take care of children on your own. Eating and drinking  Follow the diet that is recommended by your health care provider.  If you vomit, drink water, juice, or soup when you can drink without vomiting.  Make sure you have little or no nausea before eating solid foods. General instructions  Take over-the-counter and prescription medicines only as told by your health care provider.  If you have sleep apnea, surgery and certain medicines can increase your risk for breathing problems. Follow instructions from your health care provider about wearing your sleep  device: ? Anytime you are sleeping, including during daytime naps. ? While taking prescription pain medicines, sleeping medicines, or medicines that make you drowsy.  If you smoke, do not smoke without supervision.  Keep all follow-up visits as told by your health care provider. This is important. Contact a health care provider if:  You keep feeling nauseous or you keep vomiting.  You feel light-headed.  You develop a rash.  You have a fever. Get help right away if:  You have trouble breathing. Summary  For several hours after your procedure, you may feel sleepy and have poor judgment.  Have a responsible adult stay with you for at least 24 hours or until you are awake and alert. This information is not intended to replace advice given to you by your health care provider. Make sure you discuss any questions you have with your health care provider. Document Revised: 07/11/2017 Document Reviewed: 08/03/2015 Elsevier Patient Education  [place].     Upper Endoscopy, Adult, Care After This sheet gives you information about how to care for yourself after your procedure. Your health care provider may also give you more specific instructions. If you have problems or questions, contact your health care provider. What can I expect after the procedure? After the procedure, it is common to have:  A sore throat.  Mild stomach pain or discomfort.  Bloating.  Nausea. Follow these instructions at home:   Follow instructions from your health care provider about what to eat or drink after your procedure.  Return to your normal activities as told by your health care provider.  Ask your health care provider what activities are safe for you.  Take over-the-counter and prescription medicines only as told by your health care provider.  Do not drive for 24 hours if you were given a sedative during your procedure.  Keep all follow-up visits as told by your health care provider.  This is important. Contact a health care provider if you have:  A sore throat that lasts longer than one day.  Trouble swallowing. Get help right away if:  You vomit blood or your vomit looks like coffee grounds.  You have: ? A fever. ? Bloody, black, or tarry stools. ? A severe sore throat or you cannot swallow. ? Difficulty breathing. ? Severe pain in your chest or abdomen. Summary  After the procedure, it is common to have a sore throat, mild stomach discomfort, bloating, and nausea.  Do not drive for 24 hours if you were given a sedative during the procedure.  Follow instructions from your health care provider about what to eat or drink after your procedure.  Return to your normal activities as told by your health care provider. This information is not intended to replace advice given to you by your health care provider. Make sure you discuss any questions you have with your health care provider. Document Revised: 10/04/2017 Document Reviewed: 09/12/2017 Elsevier Patient Education  [place].

## 2019-07-05 NOTE — Progress Notes (Signed)
*  PRELIMINARY RESULTS* Echocardiogram Echocardiogram Transesophageal has been performed.  Donald [name] 07/05/2019, 10:51 AM

## 2019-07-05 NOTE — Anesthesia Preprocedure Evaluation (Signed)
Anesthesia Evaluation  Patient identified by MRN, date of birth, ID band Patient awake    Reviewed: Allergy & Precautions, NPO status , Patient's Chart, lab work & pertinent test results, reviewed documented beta blocker date and time   Airway Mallampati: II  TM Distance: >3 FB     Dental no notable dental hx.    Pulmonary Current Smoker and Patient abstained from smoking.,    Pulmonary exam normal        Cardiovascular hypertension, Normal cardiovascular exam+ dysrhythmias + Valvular Problems/Murmurs AI      Neuro/Psych negative neurological ROS  negative psych ROS   GI/Hepatic negative GI ROS, Neg liver ROS, Bowel prep,  Endo/Other  negative endocrine ROS  Renal/GU negative Renal ROS  negative genitourinary   Musculoskeletal negative musculoskeletal ROS (+)   Abdominal   Peds negative pediatric ROS (+)  Hematology negative hematology ROS (+)   Anesthesia Other Findings   Reproductive/Obstetrics negative OB ROS                             Anesthesia Physical Anesthesia Plan  ASA: III  Anesthesia Plan: General   Post-op Pain Management:    Induction:   PONV Risk Score and Plan: 1 and Propofol infusion  Airway Management Planned:   Additional Equipment:   Intra-op Plan:   Post-operative Plan:   Informed Consent: I have reviewed the patients History and Physical, chart, labs and discussed the procedure including the risks, benefits and alternatives for the proposed anesthesia with the patient or authorized representative who has indicated his/her understanding and acceptance.       Plan Discussed with: CRNA  Anesthesia Plan Comments:         Anesthesia Quick Evaluation

## 2019-07-05 NOTE — Interval H&P Note (Signed)
History and Physical Interval Note:  07/05/2019 8:41 AM  Patient presents for elective transesophageal echocardiogram for further investigation aortic valve anatomy and aortic regurgitation.  Interval lab work reviewed.  No significant change in status since office encounter on March 4.  Satira Sark, M.D., F.A.C.C.

## 2019-07-05 NOTE — CV Procedure (Signed)
Transesophageal echocardiogram  Indication: Aortic regurgitation  Description of procedure: After informed consent was obtained patient was taken to the PACU where a timeout was performed.  He was placed in the left lateral decubitus position.  Bite block put in place.  Moderate to deep sedation was achieved via propofol with administration and monitoring per the anesthesia service.  Multiplane transesophageal echocardiographic probe was easily inserted into the esophagus.  Multiple images were obtained and will be reported in full in the official echocardiogram report.  In brief summary, LV dysfunction was again identified with presence of eccentric, severe aortic regurgitation along the septum.  Aortic valve is trileaflet with apparent prolapse of the noncoronary cusp.  There does look to be flow reversal in the aortic arch and initial portion of the descending aorta.  Also PFO present with mild degree of left to right shunting noted mainly by color Doppler.  Patient tolerated the procedure well without immediate complications and was hemodynamically stable throughout.  Satira Sark, M.D., F.A.C.C.

## 2019-07-06 ENCOUNTER — Telehealth: Payer: Self-pay | Admitting: Cardiology

## 2019-07-06 DIAGNOSIS — I351 Nonrheumatic aortic (valve) insufficiency: Secondary | ICD-10-CM

## 2019-07-06 NOTE — Telephone Encounter (Signed)
Referral placed to [facility] for Dr.Owen.Left message for patient to call back.

## 2019-07-06 NOTE — Telephone Encounter (Signed)
I had Dr. Roxy Manns with cardiothoracic surgery review the TEE and echocardiogram images.  Please let Donald [name] know that I would like for him to have a consultation with Dr. Roxy Manns regarding repair of his aortic valve.  Would please get consultation scheduled at his earliest convenience.

## 2019-07-06 NOTE — Addendum Note (Signed)
Addended by: Barbarann Ehlers A on: 07/06/2019 08:17 AM   Modules accepted: Orders

## 2019-07-10 ENCOUNTER — Encounter: Payer: BC Managed Care – PPO | Admitting: Thoracic Surgery (Cardiothoracic Vascular Surgery)

## 2019-07-12 ENCOUNTER — Institutional Professional Consult (permissible substitution) (INDEPENDENT_AMBULATORY_CARE_PROVIDER_SITE_OTHER): Payer: BC Managed Care – PPO | Admitting: Thoracic Surgery (Cardiothoracic Vascular Surgery)

## 2019-07-12 ENCOUNTER — Other Ambulatory Visit: Payer: Self-pay

## 2019-07-12 ENCOUNTER — Encounter (HOSPITAL_COMMUNITY): Payer: Self-pay

## 2019-07-12 ENCOUNTER — Other Ambulatory Visit (HOSPITAL_COMMUNITY): Payer: Self-pay | Admitting: Emergency Medicine

## 2019-07-12 ENCOUNTER — Encounter: Payer: BC Managed Care – PPO | Admitting: Thoracic Surgery (Cardiothoracic Vascular Surgery)

## 2019-07-12 ENCOUNTER — Encounter: Payer: Self-pay | Admitting: Thoracic Surgery (Cardiothoracic Vascular Surgery)

## 2019-07-12 ENCOUNTER — Other Ambulatory Visit: Payer: Self-pay | Admitting: Thoracic Surgery (Cardiothoracic Vascular Surgery)

## 2019-07-12 VITALS — BP 140/60 | HR 76 | Resp 20 | Ht 74.0 in | Wt 197.0 lb

## 2019-07-12 DIAGNOSIS — I351 Nonrheumatic aortic (valve) insufficiency: Secondary | ICD-10-CM

## 2019-07-12 MED ORDER — METOPROLOL TARTRATE 100 MG PO TABS: 100.0000 mg | ORAL_TABLET | Freq: Once | ORAL | Status: DC

## 2019-07-12 MED ORDER — METOPROLOL TARTRATE 50 MG PO TABS: ORAL_TABLET | ORAL | 0 refills | Status: DC

## 2019-07-12 NOTE — Progress Notes (Signed)
[address]       [place] [address]             [phone]     CARDIOTHORACIC SURGERY CONSULTATION REPORT  Referring Provider is Satira Sark, MD PCP is Perlie Mayo, NP  Chief Complaint  Patient presents with   Aortic Insuffiency    Surgical eval, TEE 07/05/19, ECHO 06/28/19,    HPI:  Patient is a 54 year old male with no previous cardiac history who has been referred for surgical consultation to discuss recently diagnosed severe aortic insufficiency.  Patient states that he was told when he was younger there is a possible history of rheumatic fever.  He has not previously been told that he had a heart murmur.  He recently changed primary care physicians and was found to have a abnormal EKG which prompted referral for a formal cardiology consultation.  The patient was seen by Dr. [name] on June 28, 2019 and EKG revealed sinus rhythm with occasional PVC and right bundle branch block.  Transthoracic echocardiogram was performed June 28, 2019 and revealed moderate global left ventricular systolic dysfunction with ejection fraction estimated only 30 to 35%.  There was moderate to severe left ventricular chamber enlargement with end-diastolic left ventricular internal diameter reported 6.9 cm.  There was prolapse of what was reported to be the noncoronary cusp of the aortic valve with at least moderate to severe aortic insufficiency.  Aortic valve pressure half-time was reported 234 ms.  TEE was performed July 05, 2019 and confirmed the presence of a trileaflet aortic valve with severe aortic insufficiency with isolated prolapse involving the left coronary cusp.  Left ventricular systolic function was moderately reduced with ejection fraction estimated 30 to 35%.  There was left ventricular chamber enlargement although neither end-systolic nor end-diastolic chamber dimensions were reported.  There was a small patent foramen ovale.  Cardiothoracic surgical  consultation was requested.  Patient is married and lives with his wife in [place].  He has a total of 4 children, 3 from previous marriage.  His youngest child is 79 months old.  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  This job requires significant strenuous activity with occasional lifting.  The patient states that he is on his feet all day long at work.  He specifically denies any symptoms of exertional shortness of breath or chest discomfort.  He has not noticed any significant change in his exercise tolerance.  He denies any history of PND, orthopnea, or lower extremity edema.  He has not had palpitations, dizzy spells, nor syncope.  He admits to significant alcohol consumption and he has a long history of tobacco use.  He reports no significant physical limitations.   Past Medical History:  Diagnosis Date   Bone tumor 11/22/2013   Glenoid labral tear 10/27/2010   Hyperlipidemia    Rotator cuff tear, right 10/27/2010   Shoulder injury 10/27/2010    Past Surgical History:  Procedure Laterality Date   BACK SURGERY     multiple back surgery   Benign tumor     Left shin   BUBBLE STUDY  07/05/2019   Procedure: BUBBLE STUDY;  Surgeon: Satira Sark, MD;  Location: [facility];  Service: Cardiovascular;;   HERNIA REPAIR     SHOULDER SURGERY Left    SHOULDER SURGERY Right    TEE WITHOUT CARDIOVERSION N/A 07/05/2019   Procedure: TRANSESOPHAGEAL ECHOCARDIOGRAM (TEE) WITH PROPOFOL;  Surgeon:  Satira Sark, MD;  Location: [facility];  Service: Cardiovascular;  Laterality: N/A;   Thumb surgery Left x3    Family History  Problem Relation Age of Onset   Other Father        Kidney    Social History   Socioeconomic History   Marital status: Married    Spouse name: [name]    Number of children: 4   Years of education: Not on file   Highest education level: Some college, no degree  Occupational History    Occupation: Nurse, adult: [employer]  Tobacco Use   Smoking status: Current Every Day Smoker    Packs/day: 0.50    Types: Cigarettes   Smokeless tobacco: Never Used  Substance and Sexual Activity   Alcohol use: Yes    Alcohol/week: 6.0 standard drinks    Types: 6 Shots of liquor per week   Drug use: No   Sexual activity: Yes    Birth control/protection: Surgical  Other Topics Concern   Not on file  Social History Narrative   Live Jina wife of 2 years, previously was widowed from first wife from cancer 01/2011      Four children      Enjoy: golf, target shooting       Diet:    Caffeine: 16-20 oz coffee    Water: 6-7 bottles       Wears seat belt    Smoke detectors at home    Does not use phone while driving    Social Determinants of Radio broadcast assistant Strain:    Difficulty of Paying Living Expenses:   Food Insecurity:    Worried About Charity fundraiser in the Last Year:    Arboriculturist in the Last Year:   Transportation Needs:    Film/video editor (Medical):    Lack of Transportation (Non-Medical):   Physical Activity:    Days of Exercise per Week:    Minutes of Exercise per Session:   Stress:    Feeling of Stress :   Social Connections:    Frequency of Communication with Friends and Family:    Frequency of Social Gatherings with Friends and Family:    Attends Religious Services:    Active Member of Clubs or Organizations:    Attends Music therapist:    Marital Status:   Intimate Partner Violence:    Fear of Current or Ex-Partner:    Emotionally Abused:    Physically Abused:    Sexually Abused:     Current Outpatient Medications  Medication Sig Dispense Refill   atorvastatin (LIPITOR) 10 MG tablet Take 10 mg by mouth daily.     cholecalciferol (VITAMIN D3) 25 MCG (1000 UNIT) tablet Take 1,000 Units by mouth daily.     co-enzyme Q-10 50 MG capsule Take 50 mg by mouth  daily.     Multiple Vitamin (MULTIVITAMIN PO) Take 1 tablet by mouth daily.      Omega-3 Fatty Acids (FISH OIL) 1000 MG CAPS Take by mouth daily.     No current facility-administered medications for this visit.    Allergies  Allergen Reactions   Ultram [Tramadol Hcl] Itching    "Tunnel vision" warm feeling all over and wanted to jump out of his skin      Review of Systems:   General:  normal appetite, normal energy, no weight gain, no weight loss, no fever  Cardiac:  no chest  pain with exertion, no chest pain at rest, no SOB with exertion, no resting SOB, no PND, no orthopnea, no palpitations, no arrhythmia, no atrial fibrillation, no LE edema, no dizzy spells, no syncope  Respiratory:  no shortness of breath, no home oxygen, no productive cough, no dry cough, no bronchitis, no wheezing, no hemoptysis, no asthma, no pain with inspiration or cough, no sleep apnea, no CPAP at night  GI:   no difficulty swallowing, no reflux, no frequent heartburn, no hiatal hernia, no abdominal pain, no constipation, no diarrhea, no hematochezia, no hematemesis, no melena  GU:   no dysuria,  no frequency, no urinary tract infection, no hematuria, no enlarged prostate, no kidney stones, no kidney disease  Vascular:  no pain suggestive of claudication, + nocturnal pain in feet, occasional leg cramps, no varicose veins, no DVT, no non-healing foot ulcer  Neuro:   no stroke, no TIA's, no seizures, no headaches, no temporary blindness one eye,  no slurred speech, no peripheral neuropathy, no chronic pain, no instability of gait, no memory/cognitive dysfunction  Musculoskeletal: no arthritis, no joint swelling, no myalgias, no difficulty walking, normal mobility   Skin:   no rash, no itching, no skin infections, no pressure sores or ulcerations  Psych:   no anxiety, no depression, no nervousness, no unusual recent stress  Eyes:   no blurry vision, no floaters, no recent vision changes, + wears glasses for  reading and driving only  ENT:   no hearing loss, no loose or painful teeth, no dentures, last saw dentist February 2021  Hematologic:  no easy bruising, no abnormal bleeding, no clotting disorder, no frequent epistaxis  Endocrine:  no diabetes, does not check CBG's at home     Physical Exam:   BP 140/60    Pulse 76    Resp 20    Ht 6\' 2"  (1.88 m)    Wt 197 lb (89.4 kg)    SpO2 94% Comment: RA   BMI 25.29 kg/m   General:  Thin,  well-appearing  HEENT:  Unremarkable   Neck:   no JVD, no bruits, no adenopathy   Chest:   clear to auscultation, symmetrical breath sounds, no wheezes, no rhonchi   CV:   RRR, grade III/VI holodiastolic murmur   Abdomen:  soft, non-tender, no masses   Extremities:  warm, well-perfused, pulses palpable - water hammer, no LE edema  Rectal/GU  Deferred  Neuro:   Grossly non-focal and symmetrical throughout  Skin:   Clean and dry, no rashes, no breakdown   Diagnostic Tests:   ECHOCARDIOGRAM REPORT       Patient Name:  Donald [name] Date of Exam: 06/28/2019  Medical Rec #: [id number]   Height:    74.0 in  Accession #:  [id number]  Weight:    202.0 lb  Date of Birth: [birth year]  BSA:     2.183 m  Patient Age:  22 years   BP:      157/75 mmHg  Patient Gender: M       HR:      79 bpm.  Exam Location: [facility]   Procedure: 2D Echo, Cardiac Doppler and Color Doppler   Indications:  I45.10 (ICD-10-CM) - RBBB (right bundle branch block)         I49.3 (ICD-10-CM) - PVC (premature ventricular  contraction)         I51.7 (ICD-10-CM) - LVH (left ventricular hypertrophy)         R94.31 (ICD-10-CM) -  Abnormal EKG    History:    Patient has no prior history of Echocardiogram  examinations.         Arrythmias:RBBB; Risk Factors:Hypertension, Dyslipidemia  and         Current Smoker. LVH (left ventricular hypertrophy),RBBB  (right         bundle branch  block).    Sonographer:  BW  Referring Phys: [place]    1. Left ventricular ejection fraction, by estimation, is 30 to 35%. The  left ventricle has moderately decreased function. The left ventricle  demonstrates global hypokinesis. The left ventricular internal cavity size  was moderately to severely dilated.  There is moderate left ventricular hypertrophy. Left ventricular diastolic  parameters are indeterminate.  2. Right ventricular systolic function is normal. The right ventricular  size is normal.  3. Left atrial size was severely dilated.  4. The mitral valve is normal in structure and function. No evidence of  mitral valve regurgitation. No evidence of mitral stenosis.  5. Prolapse of the aortic valve non coronary cusp with resultant  eccentric regurgitation. The jet is eccentric and not able to measure vena  contracta. By PHT (right around 200) AI qualifies as moderate to severe.  Even though the jet is very eccentric by  color alone in several views looks to be severe. The Doppler of the  aortic arch is limited, cannot verify degree of diastolic flow reversal.  Given degree of LV dilatation and LV dysfunction consider TEE to better  quantify severity of AI and precise  anatomy of the aortic valve. . The aortic valve has an indeterminant  number of cusps. Aortic valve regurgitation is moderate to severe. No  aortic stenosis is present.  6. Aortic dilatation noted. There is mild dilatation of the aortic root  measuring 40 mm.  7. The inferior vena cava is normal in size with greater than 50%  respiratory variability, suggesting right atrial pressure of 3 mmHg.   FINDINGS  Left Ventricle: Left ventricular ejection fraction, by estimation, is 30  to 35%. The left ventricle has moderately decreased function. The left  ventricle demonstrates global hypokinesis. The left ventricular internal  cavity size was moderately to  severely  dilated. There is moderate left ventricular hypertrophy. Left  ventricular diastolic parameters are indeterminate. Normal left  ventricular filling pressure.   Right Ventricle: The right ventricular size is normal. No increase in  right ventricular wall thickness. Right ventricular systolic function is  normal.   Left Atrium: Left atrial size was severely dilated.   Right Atrium: Right atrial size was normal in size.   Pericardium: There is no evidence of pericardial effusion.   Mitral Valve: The mitral valve is normal in structure and function. No  evidence of mitral valve regurgitation. No evidence of mitral valve  stenosis.   Tricuspid Valve: The tricuspid valve is normal in structure. Tricuspid  valve regurgitation is not demonstrated. No evidence of tricuspid  stenosis.   Aortic Valve: Prolapse of the aortic valve non coronary cusp with  resultant eccentric regurgitation. The jet is eccentric and not able to  measure vena contracta. By PHT (right around 200) AI qualifies as moderate  to severe. Even though the jet is very  eccentric by color alone in several views looks to be severe. The Doppler  of the aortic arch is limited, cannot verify degree of diastolic flow  reversal. Given degree of LV dilatation and LV dysfunction consider TEE to  better quantify severity of AI and  precise anatomy of the aortic valve. The aortic valve has an indeterminant  number of cusps. Aortic valve regurgitation is moderate to severe. Aortic  regurgitation PHT measures 234 msec. No aortic stenosis is present. Aortic  valve mean gradient measures  7.0 mmHg. Aortic valve peak gradient measures 12.4 mmHg. Aortic valve  area, by VTI measures 2.33 cm.   Pulmonic Valve: The pulmonic valve was not well visualized. Pulmonic valve  regurgitation is mild. No evidence of pulmonic stenosis.   Aorta: Aortic dilatation noted. There is mild dilatation of the aortic  root measuring 40 mm.   Pulmonary  Artery: Indeterminate PASP, inadequate TR jet.   Venous: The inferior vena cava is normal in size with greater than 50%  respiratory variability, suggesting right atrial pressure of 3 mmHg.   IAS/Shunts: No atrial level shunt detected by color flow Doppler.     LEFT VENTRICLE  PLAX 2D  LVIDd:     6.90 cm   Diastology  LVIDs:     6.20 cm   LV e' lateral:  5.55 cm/s  LV PW:     1.50 cm   LV E/e' lateral: 14.8  LV IVS:    1.49 cm   LV e' medial:  9.30 cm/s  LVOT diam:   2.50 cm   LV E/e' medial: 8.8  LV SV:     85  LV SV Index:  39  LVOT Area:   4.91 cm    LV Volumes (MOD)  LV vol d, MOD A2C: 280.0 ml  LV vol d, MOD A4C: 367.0 ml  LV vol s, MOD A2C: 176.0 ml  LV vol s, MOD A4C: 233.0 ml  LV SV MOD A2C:   104.0 ml  LV SV MOD A4C:   367.0 ml  LV SV MOD BP:   113.9 ml   RIGHT VENTRICLE  RV S prime:   11.70 cm/s  TAPSE (M-mode): 2.3 cm   LEFT ATRIUM       Index    RIGHT ATRIUM      Index  LA diam:    3.50 cm 1.60 cm/m RA Area:   16.60 cm  LA Vol (A2C):  125.0 ml 57.27 ml/m RA Volume:  43.00 ml 19.70 ml/m  LA Vol (A4C):  94.6 ml 43.34 ml/m  LA Biplane Vol: 120.0 ml 54.98 ml/m  AORTIC VALVE  AV Area (Vmax):  2.30 cm  AV Area (Vmean):  2.39 cm  AV Area (VTI):   2.33 cm  AV Vmax:      176.00 cm/s  AV Vmean:     125.000 cm/s  AV VTI:      0.364 m  AV Peak Grad:   12.4 mmHg  AV Mean Grad:   7.0 mmHg  LVOT Vmax:     82.40 cm/s  LVOT Vmean:    60.800 cm/s  LVOT VTI:     0.173 m  LVOT/AV VTI ratio: 0.48  AI PHT:      234 msec    AORTA  Ao Root diam: 3.90 cm   MITRAL VALVE  MV Area (PHT): 3.45 cm  SHUNTS  MV Decel Time: 220 msec  Systemic VTI: 0.17 m  MV E velocity: 82.10 cm/s Systemic Diam: 2.50 cm  MV A velocity: 80.97 cm/s  MV E/A ratio: 1.01   Carlyle Dolly MD  Electronically signed by Carlyle Dolly MD  Signature  Date/Time: 06/28/2019/12:40:58 PM      TRANSESOPHOGEAL ECHO REPORT  Patient Name:  Donald [name] Date of Exam: 07/05/2019  Medical Rec #: [id number]   Height:    74.0 in  Accession #:  [id number]  Weight:    197.0 lb  Date of Birth: 03-28-1966  BSA:     2.160 m  Patient Age:  75 years   BP:      117/51 mmHg  Patient Gender: M       HR:      69 bpm.  Exam Location: [facility]   Procedure: Transesophageal Echo   Indications:  Aortic valve disorder 424.1 / I35.9    History:    Patient has prior history of Echocardiogram examinations,  most         recent 06/28/2019. Abnormal ECG; Risk Factors:Hypertension,         Dyslipidemia and Current Smoker. RBBB, LVH.    Sonographer:  Leavy Cella RDCS (AE)  Referring Phys: [place]: TEE procedure time was 15 minutes. The transesophogeal probe  was passed without difficulty through the esophogus of the patient. Imaged  were obtained with the patient in a left lateral decubitus position.  Sedation performed by different  physician. The patient was monitored while under deep sedation.  Anesthestetic sedation was provided intravenously by Anesthesiology: 80mg   of Propofol. Image quality was good. The patient's vital signs; including  heart rate, blood pressure, and oxygen  saturation; remained stable throughout the procedure. The patient  developed no complications during the procedure.   IMPRESSIONS    1. Left ventricular ejection fraction, by estimation, is 30 to 35%. The  left ventricle has moderately decreased function. The left ventricle  demonstrates global hypokinesis. The left ventricular internal cavity size  was mildly dilated. There is moderate  left ventricular hypertrophy. Left ventricular diastolic function could  not be evaluated.  2. Right ventricular systolic function is normal. The right ventricular  size is  normal.  3. Left atrial size was dilated. No left atrial/left atrial appendage  thrombus was detected. The LAA emptying velocity was 70 cm/s.  4. The mitral valve is grossly normal. Mild mitral valve regurgitation.  5. The aortic valve is tricuspid. There is bowing/prolapse of the  noncoronary cusp, likely incomplete coaptation with left coronary cusp  (cannot completely exclude a fenestration in the left coronary cusp).  Aortic valve regurgitation is very eccentric  with jet along the septum. Vena contracta is 0.8 cm. There is flow  reversal noted in the aortic arch and proximal descending aorta. Aortic  regurgitation is severe.  6. Evidence of atrial level shunting detected by color flow Doppler.  There is a small patent foramen ovale with predominantly left to right  shunting across the atrial septum.   FINDINGS  Left Ventricle: Left ventricular ejection fraction, by estimation, is 30  to 35%. The left ventricle has moderately decreased function. The left  ventricle demonstrates global hypokinesis. The left ventricular internal  cavity size was mildly dilated.  There is moderate left ventricular hypertrophy. Left ventricular diastolic  function could not be evaluated.   Right Ventricle: The right ventricular size is normal. No increase in  right ventricular wall thickness. Right ventricular systolic function is  normal.   Left Atrium: Left atrial size was dilated. No left atrial/left atrial  appendage thrombus was detected. The LAA emptying velocity was 70 cm/s.   Right Atrium: Right atrial size was normal in size. Prominent Eustachian  valve.   Pericardium: There is no evidence of pericardial effusion.  Mitral Valve: The mitral valve is grossly normal. Mild mitral valve  regurgitation.   Tricuspid Valve: The tricuspid valve is grossly normal. Tricuspid valve  regurgitation is trivial.   Aortic Valve: The aortic valve is tricuspid. Aortic valve regurgitation is    severe.   Pulmonic Valve: The pulmonic valve was not assessed. Pulmonic valve  regurgitation not assessed.   Aorta: The aortic root is normal in size and structure.   IAS/Shunts: Evidence of atrial level shunting detected by color flow  Doppler. Agitated saline contrast was given intravenously to evaluate for  intracardiac shunting. A small patent foramen ovale is detected with  predominantly left to right shunting  across the atrial septum.   Rozann Lesches MD  Electronically signed by Rozann Lesches MD  Signature Date/Time: 07/05/2019/11:43:31 AM     Impression:  Patient has aortic valve prolapse with stage C2 severe asymptomatic aortic insufficiency associated with moderate to severe global left ventricular systolic dysfunction and significant left ventricular chamber enlargement.  I have personally reviewed the patient's recent transthoracic and transesophageal echocardiograms.  The patient's aortic valve is trileaflet.  There is severe prolapse with an obvious fracture line involving the left coronary leaflet causing severe aortic insufficiency.  There is moderate to severe left ventricular chamber enlargement and moderate to severe global left ventricular systolic dysfunction with ejection fraction estimated only 30 to 35%.  Despite the fact that the patient remains entirely asymptomatic he would best be treated with elective surgical intervention.  He appears to be a good candidate for an attempt at valve repair.    Plan:  The patient was counseled at length regarding treatment alternatives for management of severe aortic insufficiency including continued medical therapy versus proceeding with aortic valve replacement in the near future.  The natural history of aortic insufficiency was reviewed, as was long term prognosis with medical therapy alone.  We directly reviewed images from his recent TEE and the concerning presence of significant left ventricular chamber enlargement and  systolic dysfunction were discussed.  Surgical options were discussed at length including an attempt at valve repair versus conventional surgical aortic valve replacement through either a full median sternotomy or using minimally invasive techniques.  The potential advantages of valve repair were discussed as well as questions about long-term durability.  Discussion was held comparing the relative risks of mechanical valve replacement with need for lifelong anticoagulation versus use of a bioprosthetic tissue valve and the associated potential for late structural valve deterioration and failure.  Expectations regarding the patient's postoperative convalescence were discussed at length.  Patient is interested in proceeding with elective aortic valve repair in the near future.  As a next step he will need to undergo left and right heart catheterization to rule out the presence of significant coronary artery disease and evaluate right-sided pressures.  The patient will also undergo cardiac gated CT angiogram of the heart and CT angiogram of the thoracic and abdominal aorta to fully evaluate the anatomical size and characteristics of the aortic root and the remainder of the aorta.  We tentatively plan to proceed with surgery on August 09, 2019.  The patient will return to our office prior to surgery on August 06, 2019.  He has been strongly encouraged to find a way to quit smoking.    I spent in excess of 90 minutes during the conduct of this office consultation and >50% of this time involved direct face-to-face encounter with the patient for counseling and/or coordination of their care.   Braulio Conte  Keturah Barre, MD 07/12/2019 11:04 AM

## 2019-07-12 NOTE — H&P (View-Only) (Signed)
[address]       [place], [address]             [facility] REPORT  Referring Provider is Satira Sark, MD PCP is Perlie Mayo, NP  Chief Complaint  Patient presents with  . Aortic Insuffiency    Surgical eval, TEE 07/05/19, ECHO 06/28/19,    HPI:  Patient is a 54 year old male with no previous cardiac history who has been referred for surgical consultation to discuss recently diagnosed severe aortic insufficiency.  Patient states that he was told when he was younger there is a possible history of rheumatic fever.  He has not previously been told that he had a heart murmur.  He recently changed primary care physicians and was found to have a abnormal EKG which prompted referral for a formal cardiology consultation.  The patient was seen by Dr. [name] on June 28, 2019 and EKG revealed sinus rhythm with occasional PVC and right bundle branch block.  Transthoracic echocardiogram was performed June 28, 2019 and revealed moderate global left ventricular systolic dysfunction with ejection fraction estimated only 30 to 35%.  There was moderate to severe left ventricular chamber enlargement with end-diastolic left ventricular internal diameter reported 6.9 cm.  There was prolapse of what was reported to be the noncoronary cusp of the aortic valve with at least moderate to severe aortic insufficiency.  Aortic valve pressure half-time was reported 234 ms.  TEE was performed July 05, 2019 and confirmed the presence of a trileaflet aortic valve with severe aortic insufficiency with isolated prolapse involving the left coronary cusp.  Left ventricular systolic function was moderately reduced with ejection fraction estimated 30 to 35%.  There was left ventricular chamber enlargement although neither end-systolic nor end-diastolic chamber dimensions were reported.  There was a small patent foramen ovale.  Cardiothoracic surgical  consultation was requested.  Patient is married and lives with his wife in [place].  He has a total of 4 children, 3 from previous marriage.  His youngest child is 44 months old.  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  This job requires significant strenuous activity with occasional lifting.  The patient states that he is on his feet all day long at work.  He specifically denies any symptoms of exertional shortness of breath or chest discomfort.  He has not noticed any significant change in his exercise tolerance.  He denies any history of PND, orthopnea, or lower extremity edema.  He has not had palpitations, dizzy spells, nor syncope.  He admits to significant alcohol consumption and he has a long history of tobacco use.  He reports no significant physical limitations.   Past Medical History:  Diagnosis Date  . Bone tumor 11/22/2013  . Glenoid labral tear 10/27/2010  . Hyperlipidemia   . Rotator cuff tear, right 10/27/2010  . Shoulder injury 10/27/2010    Past Surgical History:  Procedure Laterality Date  . BACK SURGERY     multiple back surgery  . Benign tumor     Left shin  . BUBBLE STUDY  07/05/2019   Procedure: BUBBLE STUDY;  Surgeon: Satira Sark, MD;  Location: [facility];  Service: Cardiovascular;;  . HERNIA REPAIR    . SHOULDER SURGERY Left   . SHOULDER SURGERY Right   . TEE WITHOUT CARDIOVERSION N/A 07/05/2019   Procedure: TRANSESOPHAGEAL ECHOCARDIOGRAM (TEE) WITH PROPOFOL;  Surgeon:  Satira Sark, MD;  Location: [facility];  Service: Cardiovascular;  Laterality: N/A;  . Thumb surgery Left x3    Family History  Problem Relation Age of Onset  . Other Father        Kidney    Social History   Socioeconomic History  . Marital status: Married    Spouse name: [name]   . Number of children: 4  . Years of education: Not on file  . Highest education level: Some college, no degree  Occupational History  .  Occupation: Nurse, adult: [employer]  Tobacco Use  . Smoking status: Current Every Day Smoker    Packs/day: 0.50    Types: Cigarettes  . Smokeless tobacco: Never Used  Substance and Sexual Activity  . Alcohol use: Yes    Alcohol/week: 6.0 standard drinks    Types: 6 Shots of liquor per week  . Drug use: No  . Sexual activity: Yes    Birth control/protection: Surgical  Other Topics Concern  . Not on file  Social History Narrative   Live [name] wife of 2 years, previously was widowed from first wife from cancer 01/2011      Four children      Enjoy: golf, target shooting       Diet:    Caffeine: 16-20 oz coffee    Water: 6-7 bottles       Wears seat belt    Smoke detectors at home    Does not use phone while driving    Social Determinants of Health   Financial Resource Strain:   . Difficulty of Paying Living Expenses:   Food Insecurity:   . Worried About Charity fundraiser in the Last Year:   . Arboriculturist in the Last Year:   Transportation Needs:   . Film/video editor (Medical):   Marland Kitchen Lack of Transportation (Non-Medical):   Physical Activity:   . Days of Exercise per Week:   . Minutes of Exercise per Session:   Stress:   . Feeling of Stress :   Social Connections:   . Frequency of Communication with Friends and Family:   . Frequency of Social Gatherings with Friends and Family:   . Attends Religious Services:   . Active Member of Clubs or Organizations:   . Attends Archivist Meetings:   Marland Kitchen Marital Status:   Intimate Partner Violence:   . Fear of Current or Ex-Partner:   . Emotionally Abused:   Marland Kitchen Physically Abused:   . Sexually Abused:     Current Outpatient Medications  Medication Sig Dispense Refill  . atorvastatin (LIPITOR) 10 MG tablet Take 10 mg by mouth daily.    . cholecalciferol (VITAMIN D3) 25 MCG (1000 UNIT) tablet Take 1,000 Units by mouth daily.    Marland Kitchen co-enzyme Q-10 50 MG capsule Take 50 mg by mouth  daily.    . Multiple Vitamin (MULTIVITAMIN PO) Take 1 tablet by mouth daily.     . Omega-3 Fatty Acids (FISH OIL) 1000 MG CAPS Take by mouth daily.     No current facility-administered medications for this visit.    Allergies  Allergen Reactions  . Ultram [Tramadol Hcl] Itching    "Tunnel vision" warm feeling all over and wanted to jump out of his skin      Review of Systems:   General:  normal appetite, normal energy, no weight gain, no weight loss, no fever  Cardiac:  no chest  pain with exertion, no chest pain at rest, no SOB with exertion, no resting SOB, no PND, no orthopnea, no palpitations, no arrhythmia, no atrial fibrillation, no LE edema, no dizzy spells, no syncope  Respiratory:  no shortness of breath, no home oxygen, no productive cough, no dry cough, no bronchitis, no wheezing, no hemoptysis, no asthma, no pain with inspiration or cough, no sleep apnea, no CPAP at night  GI:   no difficulty swallowing, no reflux, no frequent heartburn, no hiatal hernia, no abdominal pain, no constipation, no diarrhea, no hematochezia, no hematemesis, no melena  GU:   no dysuria,  no frequency, no urinary tract infection, no hematuria, no enlarged prostate, no kidney stones, no kidney disease  Vascular:  no pain suggestive of claudication, + nocturnal pain in feet, occasional leg cramps, no varicose veins, no DVT, no non-healing foot ulcer  Neuro:   no stroke, no TIA's, no seizures, no headaches, no temporary blindness one eye,  no slurred speech, no peripheral neuropathy, no chronic pain, no instability of gait, no memory/cognitive dysfunction  Musculoskeletal: no arthritis, no joint swelling, no myalgias, no difficulty walking, normal mobility   Skin:   no rash, no itching, no skin infections, no pressure sores or ulcerations  Psych:   no anxiety, no depression, no nervousness, no unusual recent stress  Eyes:   no blurry vision, no floaters, no recent vision changes, + wears glasses for  reading and driving only  ENT:   no hearing loss, no loose or painful teeth, no dentures, last saw dentist February 2021  Hematologic:  no easy bruising, no abnormal bleeding, no clotting disorder, no frequent epistaxis  Endocrine:  no diabetes, does not check CBG's at home     Physical Exam:   BP 140/60   Pulse 76   Resp 20   Ht 6\' 2"  (1.88 m)   Wt 197 lb (89.4 kg)   SpO2 94% Comment: RA  BMI 25.29 kg/m   General:  Thin,  well-appearing  HEENT:  Unremarkable   Neck:   no JVD, no bruits, no adenopathy   Chest:   clear to auscultation, symmetrical breath sounds, no wheezes, no rhonchi   CV:   RRR, grade III/VI holodiastolic murmur   Abdomen:  soft, non-tender, no masses   Extremities:  warm, well-perfused, pulses palpable - water hammer, no LE edema  Rectal/GU  Deferred  Neuro:   Grossly non-focal and symmetrical throughout  Skin:   Clean and dry, no rashes, no breakdown   Diagnostic Tests:   ECHOCARDIOGRAM REPORT       Patient Name:  Donald [name] Date of Exam: 06/28/2019  Medical Rec #: [id number]   Height:    74.0 in  Accession #:  [id number]  Weight:    202.0 lb  Date of Birth: 1965-12-17  BSA:     2.183 m  Patient Age:  17 years   BP:      157/75 mmHg  Patient Gender: M       HR:      79 bpm.  Exam Location: [facility]   Procedure: 2D Echo, Cardiac Doppler and Color Doppler   Indications:  I45.10 (ICD-10-CM) - RBBB (right bundle branch block)         I49.3 (ICD-10-CM) - PVC (premature ventricular  contraction)         I51.7 (ICD-10-CM) - LVH (left ventricular hypertrophy)         R94.31 (ICD-10-CM) - Abnormal EKG    History:  Patient has no prior history of Echocardiogram  examinations.         Arrythmias:RBBB; Risk Factors:Hypertension, Dyslipidemia  and         Current Smoker. LVH (left ventricular hypertrophy),RBBB  (right         bundle branch  block).    Sonographer:  BW  Referring Phys: [place]    1. Left ventricular ejection fraction, by estimation, is 30 to 35%. The  left ventricle has moderately decreased function. The left ventricle  demonstrates global hypokinesis. The left ventricular internal cavity size  was moderately to severely dilated.  There is moderate left ventricular hypertrophy. Left ventricular diastolic  parameters are indeterminate.  2. Right ventricular systolic function is normal. The right ventricular  size is normal.  3. Left atrial size was severely dilated.  4. The mitral valve is normal in structure and function. No evidence of  mitral valve regurgitation. No evidence of mitral stenosis.  5. Prolapse of the aortic valve non coronary cusp with resultant  eccentric regurgitation. The jet is eccentric and not able to measure vena  contracta. By PHT (right around 200) AI qualifies as moderate to severe.  Even though the jet is very eccentric by  color alone in several views looks to be severe. The Doppler of the  aortic arch is limited, cannot verify degree of diastolic flow reversal.  Given degree of LV dilatation and LV dysfunction consider TEE to better  quantify severity of AI and precise  anatomy of the aortic valve. . The aortic valve has an indeterminant  number of cusps. Aortic valve regurgitation is moderate to severe. No  aortic stenosis is present.  6. Aortic dilatation noted. There is mild dilatation of the aortic root  measuring 40 mm.  7. The inferior vena cava is normal in size with greater than 50%  respiratory variability, suggesting right atrial pressure of 3 mmHg.   FINDINGS  Left Ventricle: Left ventricular ejection fraction, by estimation, is 30  to 35%. The left ventricle has moderately decreased function. The left  ventricle demonstrates global hypokinesis. The left ventricular internal  cavity size was moderately to  severely  dilated. There is moderate left ventricular hypertrophy. Left  ventricular diastolic parameters are indeterminate. Normal left  ventricular filling pressure.   Right Ventricle: The right ventricular size is normal. No increase in  right ventricular wall thickness. Right ventricular systolic function is  normal.   Left Atrium: Left atrial size was severely dilated.   Right Atrium: Right atrial size was normal in size.   Pericardium: There is no evidence of pericardial effusion.   Mitral Valve: The mitral valve is normal in structure and function. No  evidence of mitral valve regurgitation. No evidence of mitral valve  stenosis.   Tricuspid Valve: The tricuspid valve is normal in structure. Tricuspid  valve regurgitation is not demonstrated. No evidence of tricuspid  stenosis.   Aortic Valve: Prolapse of the aortic valve non coronary cusp with  resultant eccentric regurgitation. The jet is eccentric and not able to  measure vena contracta. By PHT (right around 200) AI qualifies as moderate  to severe. Even though the jet is very  eccentric by color alone in several views looks to be severe. The Doppler  of the aortic arch is limited, cannot verify degree of diastolic flow  reversal. Given degree of LV dilatation and LV dysfunction consider TEE to  better quantify severity of AI and  precise anatomy  of the aortic valve. The aortic valve has an indeterminant  number of cusps. Aortic valve regurgitation is moderate to severe. Aortic  regurgitation PHT measures 234 msec. No aortic stenosis is present. Aortic  valve mean gradient measures  7.0 mmHg. Aortic valve peak gradient measures 12.4 mmHg. Aortic valve  area, by VTI measures 2.33 cm.   Pulmonic Valve: The pulmonic valve was not well visualized. Pulmonic valve  regurgitation is mild. No evidence of pulmonic stenosis.   Aorta: Aortic dilatation noted. There is mild dilatation of the aortic  root measuring 40 mm.   Pulmonary  Artery: Indeterminate PASP, inadequate TR jet.   Venous: The inferior vena cava is normal in size with greater than 50%  respiratory variability, suggesting right atrial pressure of 3 mmHg.   IAS/Shunts: No atrial level shunt detected by color flow Doppler.     LEFT VENTRICLE  PLAX 2D  LVIDd:     6.90 cm   Diastology  LVIDs:     6.20 cm   LV e' lateral:  5.55 cm/s  LV PW:     1.50 cm   LV E/e' lateral: 14.8  LV IVS:    1.49 cm   LV e' medial:  9.30 cm/s  LVOT diam:   2.50 cm   LV E/e' medial: 8.8  LV SV:     85  LV SV Index:  39  LVOT Area:   4.91 cm    LV Volumes (MOD)  LV vol d, MOD A2C: 280.0 ml  LV vol d, MOD A4C: 367.0 ml  LV vol s, MOD A2C: 176.0 ml  LV vol s, MOD A4C: 233.0 ml  LV SV MOD A2C:   104.0 ml  LV SV MOD A4C:   367.0 ml  LV SV MOD BP:   113.9 ml   RIGHT VENTRICLE  RV S prime:   11.70 cm/s  TAPSE (M-mode): 2.3 cm   LEFT ATRIUM       Index    RIGHT ATRIUM      Index  LA diam:    3.50 cm 1.60 cm/m RA Area:   16.60 cm  LA Vol (A2C):  125.0 ml 57.27 ml/m RA Volume:  43.00 ml 19.70 ml/m  LA Vol (A4C):  94.6 ml 43.34 ml/m  LA Biplane Vol: 120.0 ml 54.98 ml/m  AORTIC VALVE  AV Area (Vmax):  2.30 cm  AV Area (Vmean):  2.39 cm  AV Area (VTI):   2.33 cm  AV Vmax:      176.00 cm/s  AV Vmean:     125.000 cm/s  AV VTI:      0.364 m  AV Peak Grad:   12.4 mmHg  AV Mean Grad:   7.0 mmHg  LVOT Vmax:     82.40 cm/s  LVOT Vmean:    60.800 cm/s  LVOT VTI:     0.173 m  LVOT/AV VTI ratio: 0.48  AI PHT:      234 msec    AORTA  Ao Root diam: 3.90 cm   MITRAL VALVE  MV Area (PHT): 3.45 cm  SHUNTS  MV Decel Time: 220 msec  Systemic VTI: 0.17 m  MV E velocity: 82.10 cm/s Systemic Diam: 2.50 cm  MV A velocity: 80.97 cm/s  MV E/A ratio: 1.01   Carlyle Dolly MD  Electronically signed by Carlyle Dolly MD  Signature  Date/Time: 06/28/2019/12:40:58 PM      TRANSESOPHOGEAL ECHO REPORT       Patient Name:  Donald [name]  Altha Harm Date of Exam: 07/05/2019  Medical Rec #: [id number]   Height:    74.0 in  Accession #:  [id number]  Weight:    197.0 lb  Date of Birth: 01-27-1966  BSA:     2.160 m  Patient Age:  50 years   BP:      117/51 mmHg  Patient Gender: M       HR:      69 bpm.  Exam Location: [facility]   Procedure: Transesophageal Echo   Indications:  Aortic valve disorder 424.1 / I35.9    History:    Patient has prior history of Echocardiogram examinations,  most         recent 06/28/2019. Abnormal ECG; Risk Factors:Hypertension,         Dyslipidemia and Current Smoker. RBBB, LVH.    Sonographer:  Leavy Cella RDCS (AE)  Referring Phys: [place]: TEE procedure time was 15 minutes. The transesophogeal probe  was passed without difficulty through the esophogus of the patient. Imaged  were obtained with the patient in a left lateral decubitus position.  Sedation performed by different  physician. The patient was monitored while under deep sedation.  Anesthestetic sedation was provided intravenously by Anesthesiology: 80mg   of Propofol. Image quality was good. The patient's vital signs; including  heart rate, blood pressure, and oxygen  saturation; remained stable throughout the procedure. The patient  developed no complications during the procedure.   IMPRESSIONS    1. Left ventricular ejection fraction, by estimation, is 30 to 35%. The  left ventricle has moderately decreased function. The left ventricle  demonstrates global hypokinesis. The left ventricular internal cavity size  was mildly dilated. There is moderate  left ventricular hypertrophy. Left ventricular diastolic function could  not be evaluated.  2. Right ventricular systolic function is normal. The right ventricular  size is  normal.  3. Left atrial size was dilated. No left atrial/left atrial appendage  thrombus was detected. The LAA emptying velocity was 70 cm/s.  4. The mitral valve is grossly normal. Mild mitral valve regurgitation.  5. The aortic valve is tricuspid. There is bowing/prolapse of the  noncoronary cusp, likely incomplete coaptation with left coronary cusp  (cannot completely exclude a fenestration in the left coronary cusp).  Aortic valve regurgitation is very eccentric  with jet along the septum. Vena contracta is 0.8 cm. There is flow  reversal noted in the aortic arch and proximal descending aorta. Aortic  regurgitation is severe.  6. Evidence of atrial level shunting detected by color flow Doppler.  There is a small patent foramen ovale with predominantly left to right  shunting across the atrial septum.   FINDINGS  Left Ventricle: Left ventricular ejection fraction, by estimation, is 30  to 35%. The left ventricle has moderately decreased function. The left  ventricle demonstrates global hypokinesis. The left ventricular internal  cavity size was mildly dilated.  There is moderate left ventricular hypertrophy. Left ventricular diastolic  function could not be evaluated.   Right Ventricle: The right ventricular size is normal. No increase in  right ventricular wall thickness. Right ventricular systolic function is  normal.   Left Atrium: Left atrial size was dilated. No left atrial/left atrial  appendage thrombus was detected. The LAA emptying velocity was 70 cm/s.   Right Atrium: Right atrial size was normal in size. Prominent Eustachian  valve.   Pericardium: There is no evidence of pericardial effusion.   Mitral Valve: The  mitral valve is grossly normal. Mild mitral valve  regurgitation.   Tricuspid Valve: The tricuspid valve is grossly normal. Tricuspid valve  regurgitation is trivial.   Aortic Valve: The aortic valve is tricuspid. Aortic valve regurgitation is    severe.   Pulmonic Valve: The pulmonic valve was not assessed. Pulmonic valve  regurgitation not assessed.   Aorta: The aortic root is normal in size and structure.   IAS/Shunts: Evidence of atrial level shunting detected by color flow  Doppler. Agitated saline contrast was given intravenously to evaluate for  intracardiac shunting. A small patent foramen ovale is detected with  predominantly left to right shunting  across the atrial septum.   Rozann Lesches MD  Electronically signed by Rozann Lesches MD  Signature Date/Time: 07/05/2019/11:43:31 AM     Impression:  Patient has aortic valve prolapse with stage C2 severe asymptomatic aortic insufficiency associated with moderate to severe global left ventricular systolic dysfunction and significant left ventricular chamber enlargement.  I have personally reviewed the patient's recent transthoracic and transesophageal echocardiograms.  The patient's aortic valve is trileaflet.  There is severe prolapse with an obvious fracture line involving the left coronary leaflet causing severe aortic insufficiency.  There is moderate to severe left ventricular chamber enlargement and moderate to severe global left ventricular systolic dysfunction with ejection fraction estimated only 30 to 35%.  Despite the fact that the patient remains entirely asymptomatic he would best be treated with elective surgical intervention.  He appears to be a good candidate for an attempt at valve repair.    Plan:  The patient was counseled at length regarding treatment alternatives for management of severe aortic insufficiency including continued medical therapy versus proceeding with aortic valve replacement in the near future.  The natural history of aortic insufficiency was reviewed, as was long term prognosis with medical therapy alone.  We directly reviewed images from his recent TEE and the concerning presence of significant left ventricular chamber enlargement and  systolic dysfunction were discussed.  Surgical options were discussed at length including an attempt at valve repair versus conventional surgical aortic valve replacement through either a full median sternotomy or using minimally invasive techniques.  The potential advantages of valve repair were discussed as well as questions about long-term durability.  Discussion was held comparing the relative risks of mechanical valve replacement with need for lifelong anticoagulation versus use of a bioprosthetic tissue valve and the associated potential for late structural valve deterioration and failure.  Expectations regarding the patient's postoperative convalescence were discussed at length.  Patient is interested in proceeding with elective aortic valve repair in the near future.  As a next step he will need to undergo left and right heart catheterization to rule out the presence of significant coronary artery disease and evaluate right-sided pressures.  The patient will also undergo cardiac gated CT angiogram of the heart and CT angiogram of the thoracic and abdominal aorta to fully evaluate the anatomical size and characteristics of the aortic root and the remainder of the aorta.  We tentatively plan to proceed with surgery on August 09, 2019.  The patient will return to our office prior to surgery on August 06, 2019.  He has been strongly encouraged to find a way to quit smoking.    I spent in excess of 90 minutes during the conduct of this office consultation and >50% of this time involved direct face-to-face encounter with the patient for counseling and/or coordination of their care.   Valentina Gu. Roxy Manns, MD  07/12/2019 11:04 AM

## 2019-07-12 NOTE — Patient Instructions (Addendum)
Stop smoking immediately and permanently.  Continue all previous medications without any changes at this time  

## 2019-07-13 ENCOUNTER — Other Ambulatory Visit: Payer: Self-pay

## 2019-07-13 DIAGNOSIS — I351 Nonrheumatic aortic (valve) insufficiency: Secondary | ICD-10-CM

## 2019-07-20 ENCOUNTER — Telehealth: Payer: Self-pay

## 2019-07-20 NOTE — Telephone Encounter (Signed)
-----   Message from Sherren Mocha, MD sent at 07/20/2019  1:11 PM EDT ----- Here's another one from Dr Roxy Manns ----- Message ----- From: Rexene Alberts, MD Sent: 07/12/2019   2:06 PM EDT To: Sherren Mocha, MD  Here's another one who needs cath - tentative surgery 4/15

## 2019-07-20 NOTE — Telephone Encounter (Signed)
L/[facility] scheduled with Dr. Angelena Form 4/1. Dx: AI Arrival time: 1000 for 1200 case Labs drawn 3/3 COVID test 3/29 at [facility] Instruction letter reviewed and released to [place]. The patient agrees with treatment plan.

## 2019-07-23 ENCOUNTER — Encounter: Payer: Self-pay | Admitting: Family Medicine

## 2019-07-23 ENCOUNTER — Other Ambulatory Visit: Payer: Self-pay

## 2019-07-23 ENCOUNTER — Other Ambulatory Visit (HOSPITAL_COMMUNITY)
Admission: RE | Admit: 2019-07-23 | Discharge: 2019-07-23 | Disposition: A | Discharge status: Home / Self Care | Payer: BC Managed Care – PPO | Source: Ambulatory Visit | Attending: Cardiovascular Disease | Admitting: Cardiovascular Disease

## 2019-07-23 DIAGNOSIS — Z20822 Contact with and (suspected) exposure to covid-19: Secondary | ICD-10-CM | POA: Insufficient documentation

## 2019-07-23 DIAGNOSIS — Z01812 Encounter for preprocedural laboratory examination: Secondary | ICD-10-CM | POA: Diagnosis not present

## 2019-07-23 LAB — SARS CORONAVIRUS 2 (TAT 6-24 HRS): SARS Coronavirus 2: NEGATIVE

## 2019-07-23 NOTE — Addendum Note (Signed)
Addended by: Lauree Chandler D on: 07/23/2019 07:22 AM   Modules accepted: Orders, SmartSet

## 2019-07-24 ENCOUNTER — Telehealth: Payer: Self-pay | Admitting: *Deleted

## 2019-07-24 NOTE — Telephone Encounter (Signed)
Pt contacted pre-catheterization scheduled at [facility] for: Thursday July 26, 2019 9 AM Verified arrival time and place: [facility]) at: 7 AM   No solid food after midnight prior to cath, clear liquids until 5 AM day of procedure. Contrast allergy: no   AM meds can be  taken pre-cath with sip of water including: ASA 81 mg   Confirmed patient has responsible adult to drive home post procedure and observe 24 hours after arriving home: yes  Currently, due to Covid-19 pandemic, only one person will be allowed with patient. Must be the same person for patient's entire stay and will be required to wear a mask. They will be asked to wait in the waiting room for the duration of the patient's stay.  Patients are required to wear a mask when they enter the hospital.      COVID-19 Pre-Screening Questions:  . In the past 7 to 10 days have you had a cough,  shortness of breath, headache, congestion, fever (100 or greater) body aches, chills, sore throat, or sudden loss of taste or sense of smell? no . Have you been around anyone with known Covid 19 in the past 7-10 days? no . Have you been around anyone who is awaiting Covid 19 test results in the past 7 to 10 days? no . Have you been around anyone who has been exposed to Covid 19, or has mentioned symptoms of Covid 19 within the past 7 to 10 days? no    I reviewed procedure/mask/visitor instructions, COVID-19 screening questions with patient.

## 2019-07-26 ENCOUNTER — Ambulatory Visit (HOSPITAL_COMMUNITY)
Admission: RE | Admit: 2019-07-26 | Discharge: 2019-07-26 | Disposition: A | Discharge status: Home / Self Care | Payer: BC Managed Care – PPO | Attending: Cardiovascular Disease | Admitting: Cardiovascular Disease

## 2019-07-26 ENCOUNTER — Other Ambulatory Visit: Payer: Self-pay

## 2019-07-26 ENCOUNTER — Encounter (HOSPITAL_COMMUNITY)
Admission: RE | Disposition: A | Discharge status: Home / Self Care | Payer: BC Managed Care – PPO | Source: Home / Self Care | Attending: Cardiovascular Disease

## 2019-07-26 DIAGNOSIS — Q211 Atrial septal defect: Secondary | ICD-10-CM | POA: Insufficient documentation

## 2019-07-26 DIAGNOSIS — I351 Nonrheumatic aortic (valve) insufficiency: Secondary | ICD-10-CM

## 2019-07-26 DIAGNOSIS — I4510 Unspecified right bundle-branch block: Secondary | ICD-10-CM | POA: Insufficient documentation

## 2019-07-26 DIAGNOSIS — E785 Hyperlipidemia, unspecified: Secondary | ICD-10-CM | POA: Insufficient documentation

## 2019-07-26 DIAGNOSIS — I2510 Atherosclerotic heart disease of native coronary artery without angina pectoris: Secondary | ICD-10-CM | POA: Diagnosis not present

## 2019-07-26 DIAGNOSIS — Z888 Allergy status to other drugs, medicaments and biological substances status: Secondary | ICD-10-CM | POA: Insufficient documentation

## 2019-07-26 DIAGNOSIS — Z79899 Other long term (current) drug therapy: Secondary | ICD-10-CM | POA: Diagnosis not present

## 2019-07-26 DIAGNOSIS — I352 Nonrheumatic aortic (valve) stenosis with insufficiency: Secondary | ICD-10-CM | POA: Diagnosis not present

## 2019-07-26 DIAGNOSIS — F17210 Nicotine dependence, cigarettes, uncomplicated: Secondary | ICD-10-CM | POA: Diagnosis not present

## 2019-07-26 HISTORY — PX: RIGHT/LEFT HEART CATH AND CORONARY ANGIOGRAPHY: CATH118266

## 2019-07-26 LAB — POCT I-STAT EG7
Acid-base deficit: 3 mmol/L — ABNORMAL HIGH (ref 0.0–2.0)
Bicarbonate: 23.5 mmol/L (ref 20.0–28.0)
Calcium, Ion: 1.14 mmol/L — ABNORMAL LOW (ref 1.15–1.40)
HCT: 39 % (ref 39.0–52.0)
Hemoglobin: 13.3 g/dL (ref 13.0–17.0)
O2 Saturation: 76 %
Potassium: 3.9 mmol/L (ref 3.5–5.1)
Sodium: 146 mmol/L — ABNORMAL HIGH (ref 135–145)
TCO2: 25 mmol/L (ref 22–32)
pCO2, Ven: 46.4 mmHg (ref 44.0–60.0)
pH, Ven: 7.314 (ref 7.250–7.430)
pO2, Ven: 45 mmHg (ref 32.0–45.0)

## 2019-07-26 LAB — POCT I-STAT 7, (LYTES, BLD GAS, ICA,H+H)
Acid-base deficit: 2 mmol/L (ref 0.0–2.0)
Bicarbonate: 24.5 mmol/L (ref 20.0–28.0)
Calcium, Ion: 1.3 mmol/L (ref 1.15–1.40)
HCT: 40 % (ref 39.0–52.0)
Hemoglobin: 13.6 g/dL (ref 13.0–17.0)
O2 Saturation: 96 %
Potassium: 4.4 mmol/L (ref 3.5–5.1)
Sodium: 142 mmol/L (ref 135–145)
TCO2: 26 mmol/L (ref 22–32)
pCO2 arterial: 47.7 mmHg (ref 32.0–48.0)
pH, Arterial: 7.32 — ABNORMAL LOW (ref 7.350–7.450)
pO2, Arterial: 91 mmHg (ref 83.0–108.0)

## 2019-07-26 SURGERY — RIGHT/LEFT HEART CATH AND CORONARY ANGIOGRAPHY
Anesthesia: LOCAL

## 2019-07-26 MED ORDER — MIDAZOLAM HCL 2 MG/2ML IJ SOLN
INTRAMUSCULAR | Status: DC | PRN
  Administered 2019-07-26: 2 mg via INTRAVENOUS

## 2019-07-26 MED ORDER — SODIUM CHLORIDE 0.9 % WEIGHT BASED INFUSION: 1.0000 mL/kg/h | INTRAVENOUS | Status: DC

## 2019-07-26 MED ORDER — MIDAZOLAM HCL 2 MG/2ML IJ SOLN
INTRAMUSCULAR | Status: AC
  Filled 2019-07-26: qty 2

## 2019-07-26 MED ORDER — IOHEXOL 350 MG/ML SOLN
INTRAVENOUS | Status: DC | PRN
  Administered 2019-07-26: 80 mL

## 2019-07-26 MED ORDER — SODIUM CHLORIDE 0.9% FLUSH: 3.0000 mL | Freq: Two times a day (BID) | INTRAVENOUS | Status: DC

## 2019-07-26 MED ORDER — FENTANYL CITRATE (PF) 100 MCG/2ML IJ SOLN
INTRAMUSCULAR | Status: DC | PRN
  Administered 2019-07-26: 25 ug via INTRAVENOUS

## 2019-07-26 MED ORDER — SODIUM CHLORIDE 0.9% FLUSH: 3.0000 mL | INTRAVENOUS | Status: DC | PRN

## 2019-07-26 MED ORDER — SODIUM CHLORIDE 0.9 % IV SOLN: 250.0000 mL | INTRAVENOUS | Status: DC | PRN

## 2019-07-26 MED ORDER — SODIUM CHLORIDE 0.9 % IV SOLN: INTRAVENOUS | Status: AC

## 2019-07-26 MED ORDER — VERAPAMIL HCL 2.5 MG/ML IV SOLN
INTRAVENOUS | Status: AC
  Filled 2019-07-26: qty 2

## 2019-07-26 MED ORDER — ONDANSETRON HCL 4 MG/2ML IJ SOLN: 4.0000 mg | Freq: Four times a day (QID) | INTRAMUSCULAR | Status: DC | PRN

## 2019-07-26 MED ORDER — VERAPAMIL HCL 2.5 MG/ML IV SOLN
INTRAVENOUS | Status: DC | PRN
  Administered 2019-07-26: 09:00:00 10 mL via INTRA_ARTERIAL

## 2019-07-26 MED ORDER — HYDRALAZINE HCL 20 MG/ML IJ SOLN: 10.0000 mg | INTRAMUSCULAR | Status: DC | PRN

## 2019-07-26 MED ORDER — SODIUM CHLORIDE 0.9 % WEIGHT BASED INFUSION
3.0000 mL/kg/h | INTRAVENOUS | Status: AC
  Administered 2019-07-26: 3 mL/kg/h via INTRAVENOUS

## 2019-07-26 MED ORDER — ASPIRIN 81 MG PO CHEW: 81.0000 mg | CHEWABLE_TABLET | ORAL | Status: DC

## 2019-07-26 MED ORDER — HEPARIN SODIUM (PORCINE) 1000 UNIT/ML IJ SOLN
INTRAMUSCULAR | Status: DC | PRN
  Administered 2019-07-26: 4000 [IU] via INTRAVENOUS

## 2019-07-26 MED ORDER — HEPARIN SODIUM (PORCINE) 1000 UNIT/ML IJ SOLN
INTRAMUSCULAR | Status: AC
  Filled 2019-07-26: qty 1

## 2019-07-26 MED ORDER — LIDOCAINE HCL (PF) 1 % IJ SOLN
INTRAMUSCULAR | Status: DC | PRN
  Administered 2019-07-26: 2 mL
  Administered 2019-07-26: 3 mL

## 2019-07-26 MED ORDER — FENTANYL CITRATE (PF) 100 MCG/2ML IJ SOLN
INTRAMUSCULAR | Status: AC
  Filled 2019-07-26: qty 2

## 2019-07-26 MED ORDER — HEPARIN (PORCINE) IN NACL 1000-0.9 UT/500ML-% IV SOLN
INTRAVENOUS | Status: AC
  Filled 2019-07-26: qty 1000

## 2019-07-26 MED ORDER — HEPARIN (PORCINE) IN NACL 1000-0.9 UT/500ML-% IV SOLN
INTRAVENOUS | Status: DC | PRN
  Administered 2019-07-26 (×2): 500 mL

## 2019-07-26 MED ORDER — LABETALOL HCL 5 MG/ML IV SOLN: 10.0000 mg | INTRAVENOUS | Status: DC | PRN

## 2019-07-26 MED ORDER — ACETAMINOPHEN 325 MG PO TABS: 650.0000 mg | ORAL_TABLET | ORAL | Status: DC | PRN

## 2019-07-26 MED ORDER — LIDOCAINE HCL (PF) 1 % IJ SOLN
INTRAMUSCULAR | Status: AC
  Filled 2019-07-26: qty 30

## 2019-07-26 SURGICAL SUPPLY — 12 items
CATH 5FR JL3.5 JR4 ANG PIG MP (CATHETERS) ×1 IMPLANT
CATH BALLN WEDGE 5F 110CM (CATHETERS) ×1 IMPLANT
DEVICE RAD COMP TR BAND LRG (VASCULAR PRODUCTS) ×1 IMPLANT
GLIDESHEATH SLEND SS 6F .021 (SHEATH) ×1 IMPLANT
GUIDEWIRE INQWIRE 1.5J.035X260 (WIRE) IMPLANT
INQWIRE 1.5J .035X260CM (WIRE) ×2
KIT HEART LEFT (KITS) ×2 IMPLANT
PACK CARDIAC CATHETERIZATION (CUSTOM PROCEDURE TRAY) ×2 IMPLANT
SHEATH GLIDE SLENDER 4/5FR (SHEATH) ×1 IMPLANT
SYR MEDRAD MARK 7 150ML (SYRINGE) ×2 IMPLANT
TRANSDUCER W/STOPCOCK (MISCELLANEOUS) ×2 IMPLANT
TUBING CIL FLEX 10 FLL-RA (TUBING) ×2 IMPLANT

## 2019-07-26 NOTE — Discharge Instructions (Signed)
Drink plenty to fluids  Keep right arm at or above heart level  Radial Site Care  This sheet gives you information about how to care for yourself after your procedure. Your health care provider may also give you more specific instructions. If you have problems or questions, contact your health care provider. What can I expect after the procedure? After the procedure, it is common to have:  Bruising and tenderness at the catheter insertion area. Follow these instructions at home: Medicines  Take over-the-counter and prescription medicines only as told by your health care provider. Insertion site care  Follow instructions from your health care provider about how to take care of your insertion site. Make sure you: ? Wash your hands with soap and water before you change your bandage (dressing). If soap and water are not available, use hand sanitizer. ? Change your dressing as told by your health care provider. ? Leave stitches (sutures), skin glue, or adhesive strips in place. These skin closures may need to stay in place for 2 weeks or longer. If adhesive strip edges start to loosen and curl up, you may trim the loose edges. Do not remove adhesive strips completely unless your health care provider tells you to do that.  Check your insertion site every day for signs of infection. Check for: ? Redness, swelling, or pain. ? Fluid or blood. ? Pus or a bad smell. ? Warmth.  Do not take baths, swim, or use a hot tub until your health care provider approves.  You may shower 24-48 hours after the procedure, or as directed by your health care provider. ? Remove the dressing and gently wash the site with plain soap and water. ? Pat the area dry with a clean towel. ? Do not rub the site. That could cause bleeding.  Do not apply powder or lotion to the site. Activity   For 24 hours after the procedure, or as directed by your health care provider: ? Do not flex or bend the affected arm. ? Do  not push or pull heavy objects with the affected arm. ? Do not drive yourself home from the hospital or clinic. You may drive 24 hours after the procedure unless your health care provider tells you not to. ? Do not operate machinery or power tools.  Do not lift anything that is heavier than 10 lb (4.5 kg), or the limit that you are told, until your health care provider says that it is safe.  Ask your health care provider when it is okay to: ? Return to work or school. ? Resume usual physical activities or sports. ? Resume sexual activity. General instructions  If the catheter site starts to bleed, raise your arm and put firm pressure on the site. If the bleeding does not stop, get help right away. This is a medical emergency.  If you went home on the same day as your procedure, a responsible adult should be with you for the first 24 hours after you arrive home.  Keep all follow-up visits as told by your health care provider. This is important. Contact a health care provider if:  You have a fever.  You have redness, swelling, or yellow drainage around your insertion site. Get help right away if:  You have unusual pain at the radial site.  The catheter insertion area swells very fast.  The insertion area is bleeding, and the bleeding does not stop when you hold steady pressure on the area.  Your arm or  hand becomes pale, cool, tingly, or numb. These symptoms may represent a serious problem that is an emergency. Do not wait to see if the symptoms will go away. Get medical help right away. Call your local emergency services (911 in the [place] Do not drive yourself to the hospital. Summary  After the procedure, it is common to have bruising and tenderness at the site.  Follow instructions from your health care provider about how to take care of your radial site wound. Check the wound every day for signs of infection.  Do not lift anything that is heavier than 10 lb (4.5 kg), or the  limit that you are told, until your health care provider says that it is safe. This information is not intended to replace advice given to you by your health care provider. Make sure you discuss any questions you have with your health care provider. Document Revised: 05/18/2017 Document Reviewed: 05/18/2017 Elsevier Patient Education  2020 [employer].

## 2019-07-26 NOTE — Progress Notes (Signed)
Ambulated to bathroom and in hallway tol well

## 2019-07-26 NOTE — Interval H&P Note (Signed)
History and Physical Interval Note:  07/26/2019 7:22 AM  Donald [name]  has presented today for surgery, with the diagnosis of Aortic insufficiency.  The various methods of treatment have been discussed with the patient and family. After consideration of risks, benefits and other options for treatment, the patient has consented to  Procedure(s): RIGHT/LEFT HEART CATH AND CORONARY ANGIOGRAPHY (N/A) as a surgical intervention.  The patient's history has been reviewed, patient examined, no change in status, stable for surgery.  I have reviewed the patient's chart and labs.  Questions were answered to the patient's satisfaction.    Cath Lab Visit (complete for each Cath Lab visit)  Clinical Evaluation Leading to the Procedure:   ACS: No.  Non-ACS:    Anginal Classification: No Symptoms  Anti-ischemic medical therapy: No Therapy  Non-Invasive Test Results: No non-invasive testing performed  Prior CABG: No previous CABG        Lauree Chandler

## 2019-07-26 NOTE — Progress Notes (Signed)
Discharge instructions reviewed with pt and his wife (via telephone) both voice understanding.  

## 2019-08-01 ENCOUNTER — Ambulatory Visit: Payer: BC Managed Care – PPO | Admitting: Family Medicine

## 2019-08-02 ENCOUNTER — Encounter (HOSPITAL_COMMUNITY): Payer: Self-pay

## 2019-08-02 ENCOUNTER — Telehealth (HOSPITAL_COMMUNITY): Payer: Self-pay | Admitting: Emergency Medicine

## 2019-08-02 NOTE — Telephone Encounter (Signed)
Attempted to call patient regarding upcoming cardiac CT appointment. °Left message on voicemail with name and callback number °Celestina Gironda RN Navigator Cardiac Imaging °[facility] °[phone] Office °[phone] Cell ° °

## 2019-08-03 ENCOUNTER — Other Ambulatory Visit: Payer: Self-pay

## 2019-08-03 ENCOUNTER — Ambulatory Visit (HOSPITAL_COMMUNITY)
Admission: RE | Admit: 2019-08-03 | Discharge: 2019-08-03 | Disposition: A | Discharge status: Home / Self Care | Payer: BC Managed Care – PPO | Source: Ambulatory Visit | Attending: Thoracic Surgery (Cardiothoracic Vascular Surgery) | Admitting: Thoracic Surgery (Cardiothoracic Vascular Surgery)

## 2019-08-03 DIAGNOSIS — I351 Nonrheumatic aortic (valve) insufficiency: Secondary | ICD-10-CM | POA: Diagnosis not present

## 2019-08-03 DIAGNOSIS — I350 Nonrheumatic aortic (valve) stenosis: Secondary | ICD-10-CM | POA: Diagnosis not present

## 2019-08-03 MED ORDER — IOHEXOL 350 MG/ML SOLN
100.0000 mL | Freq: Once | INTRAVENOUS | Status: AC | PRN
  Administered 2019-08-03: 15:00:00 100 mL via INTRAVENOUS

## 2019-08-06 ENCOUNTER — Other Ambulatory Visit (HOSPITAL_COMMUNITY): Payer: BC Managed Care – PPO

## 2019-08-06 ENCOUNTER — Encounter: Payer: Self-pay | Admitting: Thoracic Surgery (Cardiothoracic Vascular Surgery)

## 2019-08-06 ENCOUNTER — Other Ambulatory Visit: Payer: Self-pay

## 2019-08-06 ENCOUNTER — Ambulatory Visit (INDEPENDENT_AMBULATORY_CARE_PROVIDER_SITE_OTHER): Payer: BC Managed Care – PPO | Admitting: Thoracic Surgery (Cardiothoracic Vascular Surgery)

## 2019-08-06 VITALS — BP 138/62 | HR 78 | Temp 98.1°F | Resp 20 | Ht 74.0 in | Wt 195.5 lb

## 2019-08-06 DIAGNOSIS — I351 Nonrheumatic aortic (valve) insufficiency: Secondary | ICD-10-CM

## 2019-08-06 NOTE — Patient Instructions (Signed)
Stop taking fish oil (Omega 3)  Continue taking other medications without change through the day before surgery.  Make sure to bring all of your medications with you when you come for your Pre-Admission Testing appointment at [facility] Short-Stay Department.  Have nothing to eat or drink after midnight the night before surgery.  On the morning of surgery do not take any medications.

## 2019-08-06 NOTE — Progress Notes (Signed)
[id number] - [place], [address] [place] [address] Phone: [phone] Fax: [phone]      Your procedure is scheduled on Thursday, April 15th.  Report to [facility] Main Entrance "A" at 5:30 A.M., and check in at the Admitting office.  Call this number if you have problems the morning of surgery:  [phone]  Call [phone] if you have any questions prior to your surgery date Monday-Friday 8am-4pm    Remember:  Do not eat or drink after midnight the night before your surgery     Take these medicines the morning of surgery with A SIP OF WATER   NONE  As of today, STOP taking any Aspirin (unless otherwise instructed by your surgeon) and Aspirin containing products, Aleve, Naproxen, Ibuprofen, Motrin, Advil, Goody's, BC's, all herbal medications, fish oil, and all vitamins.                      Do not wear jewelry.            Do not wear lotions, powders, colognes, or deodorant.            Men may shave face and neck.            Do not bring valuables to the hospital.            [facility] is not responsible for any belongings or valuables.  Do NOT Smoke (Tobacco/Vapping) or drink Alcohol 24 hours prior to your procedure If you use a CPAP at night, you may bring all equipment for your overnight stay.   Contacts, glasses, dentures or bridgework may not be worn into surgery.      For patients admitted to the hospital, discharge time will be determined by your treatment team.   Patients discharged the day of surgery will not be allowed to drive home, and someone needs to stay with them for 24 hours.    Special instructions:   [facility]- Preparing For Surgery  Before surgery, you can play an important role. Because skin is not sterile, your skin needs to be as free of germs as possible. You can reduce the number of germs on your skin by washing with CHG (chlorahexidine gluconate) Soap before surgery.  CHG  is an antiseptic cleaner which kills germs and bonds with the skin to continue killing germs even after washing.    Oral Hygiene is also important to reduce your risk of infection.  Remember - BRUSH YOUR TEETH THE MORNING OF SURGERY WITH YOUR REGULAR TOOTHPASTE  Please do not use if you have an allergy to CHG or antibacterial soaps. If your skin becomes reddened/irritated stop using the CHG.  Do not shave (including legs and underarms) for at least 48 hours prior to first CHG shower. It is OK to shave your face.  Please follow these instructions carefully.   1. Shower the NIGHT BEFORE SURGERY and the MORNING OF SURGERY with CHG Soap.   2. If you chose to wash your hair, wash your hair first as usual with your normal shampoo.  3. After you shampoo, rinse your hair and body thoroughly to remove the shampoo.  4. Use CHG as you would any other liquid soap. You can apply CHG directly to the skin and wash gently with a scrungie or a clean washcloth.   5. Apply the CHG Soap to your body ONLY FROM THE NECK DOWN.  Do not use  on open wounds or open sores. Avoid contact with your eyes, ears, mouth and genitals (private parts). Wash Face and genitals (private parts)  with your normal soap.   6. Wash thoroughly, paying special attention to the area where your surgery will be performed.  7. Thoroughly rinse your body with warm water from the neck down.  8. DO NOT shower/wash with your normal soap after using and rinsing off the CHG Soap.  9. Pat yourself dry with a CLEAN TOWEL.  10. Wear CLEAN PAJAMAS to bed the night before surgery, wear comfortable clothes the morning of surgery  11. Place CLEAN SHEETS on your bed the night of your first shower and DO NOT SLEEP WITH PETS.   Day of Surgery:   Do not apply any deodorants/lotions.  Please wear clean clothes to the hospital/surgery center.   Remember to brush your teeth WITH YOUR REGULAR TOOTHPASTE.   Please read over the following fact sheets  that you were given.

## 2019-08-06 NOTE — Progress Notes (Signed)
[address]       [place] [address]             [id number]     CARDIOTHORACIC SURGERY OFFICE NOTE  Referring Provider is Satira Sark, MD  PCP is Perlie Mayo, NP   HPI:  Patient is a 54 year old male with no previous cardiac history who returns to the office today for follow up of stage C2 severe aortic insufficiency associated with moderate to severe global left ventricular systolic dysfunction.  He was originally seen in consultation on July 12, 2019.  Since then he underwent diagnostic cardiac catheterization on July 26, 2019.  He was found to have mild nonobstructive coronary artery disease.  Right heart pressures were normal.  Cardiac gated CT angiogram of the heart confirmed the presence of a trileaflet aortic valve with isolated prolapse of the noncoronary leaflet.  The aortic annulus was dilated but there was no significant aneurysmal enlargement of the aortic root or a sending thoracic aorta.  CT angiogram of the chest, abdomen, and pelvis revealed no significant abnormalities other than incidental finding of a small (3 mm) left renal calculus.  Patient returns to the office today for follow-up with tentative plans to proceed with elective aortic valve repair later this week.  He reports no new problems or complaints.  He did receive the first dose of the COVID-19 vaccine and is scheduled to receive his second dose in early May.  Patient states that he has cut back significantly on cigarette smoking although he has still smokes an occasional cigarette.   Current Outpatient Medications  Medication Sig Dispense Refill  . atorvastatin (LIPITOR) 10 MG tablet Take 10 mg by mouth every evening.     . Cholecalciferol (VITAMIN D3) 50 MCG (2000 UT) TABS Take 2,000 Units by mouth daily.    . Coenzyme Q10 (COQ10) 100 MG CAPS Take 100 mg by mouth daily.    Marland Kitchen ibuprofen (ADVIL) 200 MG tablet Take 400 mg by mouth every 8 (eight) hours as needed (pain.).    Marland Kitchen  metoprolol tartrate (LOPRESSOR) 50 MG tablet Take one time dose 100mg  metoprolol tartrate 2 hr prior to CT scan for HR > 55 beats per minute. 2 tablet 0  . Multiple Vitamin (MULTIVITAMIN WITH MINERALS) TABS tablet Take 1 tablet by mouth daily.    . Omega-3 Fatty Acids (FISH OIL PO) Take 2,500 mg by mouth daily.     No current facility-administered medications for this visit.      Physical Exam:   BP 138/62 (BP Location: Left Arm, Patient Position: Sitting, Cuff Size: Normal)   Pulse 78   Temp 98.1 F (36.7 C) (Temporal)   Resp 20   Ht 6\' 2"  (1.88 m)   Wt 195 lb 8 oz (88.7 kg)   SpO2 96% Comment: RA  BMI 25.10 kg/m   General:  Well-appearing  Chest:   Clear to auscultation  CV:   Regular rate and rhythm  Incisions:  n/a  Abdomen:  Soft nontender  Extremities:  Warm and well-perfused  Diagnostic Tests:  RIGHT/LEFT HEART CATH AND CORONARY ANGIOGRAPHY     Conclusion Prox RCA lesion is 20% stenosed.  Dist RCA lesion is 10% stenosed.  3rd Mrg lesion is 10% stenosed.  Prox LAD to Mid LAD lesion is 20% stenosed.  1st Diag lesion is 30% stenosed. 1. Mild non-obstructive CAD  2. Severe aortic valve insufficiency.  Recommendations: Continue planning for aortic valve repair.  Recommendations Antiplatelet/Anticoag Continue planning for aortic valve repair.     Surgeon Notes   07/05/2019 10:57 AM CV Procedure signed by Satira Sark, MD     Indications Severe aortic insufficiency [I35.1 (ICD-10-CM)]     Procedural Details Technical Details Indication: 54 yo male with severe AI. Planning for surgical repair.   Procedure: The risks, benefits, complications, treatment options, and expected outcomes were discussed with the patient. The patient and/or family concurred with the proposed plan, giving informed consent. The patient was brought to the cath lab after IV hydration was given. The patient was sedated with Versed and Fentanyl.The IV catheter present in the right  antecubital vein was changed for a 5 [place] sheath. Right heart catheterization performed with a balloon tipped catheter. The right wrist was prepped and draped in a sterile fashion. 1% lidocaine was used for local anesthesia. Using the modified Seldinger access technique, a 5 French sheath was placed in the right radial artery. 3 mg Verapamil was given through the sheath. 4000 units IV heparin was given. Standard diagnostic catheters were used to perform selective coronary angiography. LV pressures measured with the JR4 catheter. The sheath was removed from the right radial artery and a Terumo hemostasis band was applied at the arteriotomy site on the right wrist.    Estimated blood loss <50 mL.   During this procedure medications were administered to achieve and maintain moderate conscious sedation while the patient's heart rate, blood pressure, and oxygen saturation were continuously monitored and I was present face-to-face 100% of this time.     Medications (Filter: Administrations occurring from 07/26/19 0857 to 07/26/19 0951)  Continuous medications are totaled by the amount administered until 07/26/19 0951.  midazolam (VERSED) injection (mg) Total dose: 2 mg  Date/Time   Rate/Dose/Volume Action  07/26/19 0910  2 mg Given  fentaNYL (SUBLIMAZE) injection (mcg) Total dose: 25 mcg  Date/Time   Rate/Dose/Volume Action  07/26/19 0910  25 mcg Given  lidocaine (PF) (XYLOCAINE) 1 % injection (mL) Total volume: 5 mL  Date/Time   Rate/Dose/Volume Action  07/26/19 0914  2 mL Given  0916  3 mL Given  Heparin (Porcine) in NaCl 1000-0.9 UT/500ML-% SOLN (mL) Total volume: 1,000 mL  Date/Time   Rate/Dose/Volume Action  07/26/19 0914  500 mL Given  0915  500 mL Given  Radial Cocktail/Verapamil only (mL) Total volume: 10 mL  Date/Time   Rate/Dose/Volume Action  07/26/19 0917  10 mL Given  heparin injection (Units) Total dose: 4,000 Units  Date/Time   Rate/Dose/Volume Action  07/26/19  0933  4,000 Units Given  iohexol (OMNIPAQUE) 350 MG/ML injection (mL) Total volume: 80 mL  Date/Time   Rate/Dose/Volume Action  07/26/19 0945  80 mL Given     Sedation Time Sedation Time Physician-1: 29 minutes 12 seconds        Contrast Medication Name Total Dose  iohexol (OMNIPAQUE) 350 MG/ML injection 80 mL     Radiation/Fluoro Fluoro time: 6.4 (min)  DAP: [id number] (mGycm2)  Cumulative Air Kerma: [id number] (mGy)     Complications Complications documented before study signed (07/26/2019 9:59 AM)  RIGHT/LEFT HEART CATH AND CORONARY ANGIOGRAPHY  None Documented by Burnell Blanks, MD 07/26/2019 9:52 AM  Date Found: 07/26/2019  Time Range: Intraprocedure       Coronary Findings Diagnostic Dominance: Right  Left Anterior Descending  Vessel is large.  Prox LAD to Mid LAD lesion 20% stenosed  Prox LAD to Mid LAD lesion is 20% stenosed.   First Diagonal  Branch  1st Diag lesion 30% stenosed  1st Diag lesion is 30% stenosed.   Left Circumflex  Vessel is large.   Third Obtuse Marginal Branch  3rd Mrg lesion 10% stenosed  3rd Mrg lesion is 10% stenosed.   Right Coronary Artery  Vessel is large.  Prox RCA lesion 20% stenosed  Prox RCA lesion is 20% stenosed.  Dist RCA lesion 10% stenosed  Dist RCA lesion is 10% stenosed.  Intervention No interventions have been documented.                            Coronary Diagrams Diagnostic Dominance: Right  &&&&&  Intervention      Implants  No implant documentation for this case.      [employer] Images Link to Procedure Log  Show images for CARDIAC CATHETERIZATION Procedure Log     Images on Long Term Storage   Show images for Arvle, Prideaux "Chris"         Hemo Data   Most Recent Value  Fick Cardiac Output 7.08 L/min  Fick Cardiac Output Index 3.28 (L/min)/BSA  RA A Wave 10 mmHg  RA V Wave 7 mmHg  RA Mean 7 mmHg  RV Systolic Pressure 28 mmHg  RV Diastolic Pressure 4 mmHg  RV EDP 7 mmHg  PA  Systolic Pressure 25 mmHg  PA Diastolic Pressure 13 mmHg  PA Mean 18 mmHg  PW A Wave 12 mmHg  PW V Wave 9 mmHg  PW Mean 7 mmHg  AO Systolic Pressure 97 mmHg  AO Diastolic Pressure 50 mmHg  AO Mean 72 mmHg  LV Systolic Pressure 123456 mmHg  LV Diastolic Pressure 14 mmHg  LV EDP 23 mmHg  AOp Systolic Pressure 99991111 mmHg  AOp Diastolic Pressure 49 mmHg  AOp Mean Pressure 73 mmHg  LVp Systolic Pressure 99991111 mmHg  LVp Diastolic Pressure 11 mmHg  LVp EDP Pressure 20 mmHg  QP/QS 1  TPVR Index 5.47 HRUI  TSVR Index 21.93 HRUI  PVR SVR Ratio 0.17  TPVR/TSVR Ratio 0.25    Cardiac TAVR CT  TECHNIQUE: The patient was scanned on a [employer]. A 120 kV retrospective scan was triggered in the descending thoracic aorta at 111 HU's. Gantry rotation speed was 250 msecs and collimation was .6 mm. No beta blockade or nitro were given. The 3D data set was reconstructed in 5% intervals of the R-R cycle. Systolic and diastolic phases were analyzed on a dedicated work station using MPR, MIP and VRT modes. The patient received 80 cc of contrast.  FINDINGS: Image quality: Excellent.  Noise artifact is: Limited.  Valve Morphology: The aortic valve is tricuspid. There is severe prolapse of the [place] which is the likely etiology of the severe regurgitation. The aortic valve leaflets are thickened but there is no leaflet calcification.  Aortic annular dimension:  Phase assessed: 10%  Annular area: 797 mm2  Annular perimeter: 102 mm  Max diameter: 34.1 mm  Min diameter: 31.3 mm  Annular and subannular calcification: No significant annular or subannular calcification.  Optimal coplanar projection: RAO 15 CAU 7  Coronary Artery Height above Annulus:  Left Main: 13.5 mm  Right Coronary: 21.1 mm  Sinus of Valsalva Measurements:  Non-coronary: 40 mm  Right-coronary: 40 mm  Left-coronary: 41 mm  Sinus of Valsalva Height:  Non-coronary: 28.8  mm  Right-coronary: 33 mm  Left-coronary: 22.7 mm  Sinotubular Junction: 32 mm  Ascending Thoracic Aorta: 35 mm  Coronary Arteries: CAC score of 339 which is 99th percentile for age- and sex-matched controls. Normal coronary origin. Right dominance. The study was performed without use of NTG and is insufficient for plaque evaluation. Refer to recent cardiac cath for coronary evaluation.  Cardiac Morphology:  Right Atrium: Right atrial size is within normal limits.  Right Ventricle: The right ventricular cavity is within normal limits.  Left Atrium: Left atrial size is normal in size with no left atrial appendage filling defect. A small PFO is present.  Left Ventricle: The ventricular cavity size is severely dilated. There are no stigmata of prior infarction. There is no abnormal filling defect. LVEF is mildy reduced, 49%, with global hypokinesis.  Pulmonary arteries: Normal in size without proximal filling defect.  Pulmonary veins: Normal pulmonary venous drainage.  Pericardium: Normal thickness with no significant effusion or calcium present.  Mitral Valve: The mitral valve is normal structure without significant calcification.  Extra-cardiac findings: See attached radiology report for non-cardiac structures.  IMPRESSION: 1. Tricuspid aortic valve with severe prolapse of the non-coronary cusp which is the etiology of the severe regurgitation.  2. Large annulus (797 mm2) which is not appropriate for TAVR (surgical repair planned).  3. Small PFO.  4. Severely dilated left ventricular cavity.  5. CAC score of 339 which is 99th percentile for age- and sex-matched controls.    Electronically Signed   By: Eleonore Chiquito   On: 08/03/2019 17:53    CT ANGIOGRAPHY CHEST, ABDOMEN AND PELVIS  TECHNIQUE: Multidetector CT imaging through the chest, abdomen and pelvis was performed using the standard protocol during bolus administration  of intravenous contrast. Multiplanar reconstructed images and MIPs were obtained and reviewed to evaluate the vascular anatomy.  CONTRAST:  135mL OMNIPAQUE IOHEXOL 350 MG/ML SOLN  COMPARISON:  None.  FINDINGS: CTA CHEST FINDINGS  Cardiovascular: Cardiomegaly. No pericardial effusion. Central pulmonary artery branches unremarkable; the exam was not optimized for detection of pulmonary emboli. Scattered coronary calcifications. Adequate contrast opacification of the thoracic aorta with no evidence of dissection, aneurysm, or stenosis. Minimal atheromatous plaque. No significant tortuosity.There is classic 3-vessel brachiocephalic arch anatomy without proximal stenosis.  Mediastinum/Nodes: No hilar or mediastinal adenopathy.  Lungs/Pleura: No pleural effusion. No pneumothorax. Lungs are clear.  Musculoskeletal: Spondylitic changes in the lower cervical spine. DJD in bilateral shoulders. No fracture or worrisome bone lesion.  Review of the MIP images confirms the above findings.  CTA ABDOMEN AND PELVIS FINDINGS  VASCULAR  Aorta: Minimal atheromatous plaque. No aneurysm, dissection, stenosis, or significant tortuosity.  Celiac: Patent without evidence of aneurysm, dissection, vasculitis or significant stenosis.  SMA: Patent without evidence of aneurysm, dissection, vasculitis or significant stenosis.  Renals: Both renal arteries are patent without evidence of aneurysm, dissection, vasculitis, fibromuscular dysplasia or significant stenosis.  IMA: Patent without evidence of aneurysm, dissection, vasculitis or significant stenosis.  Inflow: Mild tortuosity of the iliac arterial system with minimal calcified plaque. No aneurysm, dissection, or stenosis.  Veins: No obvious venous abnormality within the limitations of this arterial phase study. Patent portal vein and bilateral renal veins.  Review of the MIP images confirms the above  findings.  NON-VASCULAR  Hepatobiliary: No focal liver abnormality is seen. No gallstones, gallbladder wall thickening, or biliary dilatation.  Pancreas: Unremarkable. No pancreatic ductal dilatation or surrounding inflammatory changes.  Spleen: Normal in size without focal abnormality.  Adrenals/Urinary Tract: 3 mm calculus, lower pole left renal collecting system. No hydronephrosis. No renal mass. Adrenal glands unremarkable. Urinary bladder incompletely distended.  Stomach/Bowel: Stomach  is within normal limits. Appendix appears normal. No evidence of bowel wall thickening, distention, or inflammatory changes.  Lymphatic:  No enlarged abdominal or pelvic lymph nodes.  Reproductive: Prostate enlargement, mild.  Other: No ascites. No free air.  Musculoskeletal: Lower lumbar spondylitic change. No fracture or worrisome bone lesion.  Review of the MIP images confirms the above findings.  IMPRESSION: 1. Negative for aortic dissection, aneurysm, or stenosis. 2. Mild tortuosity of the iliac arterial system with minimal calcified plaque. 3. Coronary calcifications. The severity of coronary artery disease and any potential stenosis cannot be assessed on this non-gated CT examination. 4. 3 mm left renal calculus.  Aortic Atherosclerosis (ICD10-I70.0).   Electronically Signed   By: Lucrezia Europe M.D.   On: 08/03/2019 15:49     Impression:  Patient has aortic valve prolapse with stage C2 severe asymptomatic aortic insufficiency associated with moderate to severe global left ventricular systolic dysfunction and significant left ventricular chamber enlargement.  I have personally reviewed the patient's recent transthoracic and transesophageal echocardiograms, cardiac catheterization and CT angiograms.  The patient's aortic valve is trileaflet.  There is severe prolapse with an obvious fracture line involving the left coronary leaflet causing severe aortic  insufficiency.  There is moderate to severe left ventricular chamber enlargement and moderate to severe global left ventricular systolic dysfunction with ejection fraction estimated only 30 to 35%.  Despite the fact that the patient remains entirely asymptomatic he would best be treated with elective surgical intervention.    Diagnostic cardiac catheterization is notable for the absence of significant coronary artery disease and normal right heart pressures.  CT angiography confirms the presence of isolated prolapse of the noncoronary leaflet with valve anatomy that would likely be amenable to repair.    Plan:  The patient and his wife were counseled at length regarding treatment alternatives for management of severe aortic insufficiency including continued medical therapy versus proceeding with aortic valve replacement in the near future.  The natural history of aortic insufficiency was reviewed, as was long term prognosis with medical therapy alone.  The potential advantages of valve repair were discussed as well as questions about long-term durability.  Discussion was held comparing the relative risks of mechanical valve replacement with need for lifelong anticoagulation versus use of a bioprosthetic tissue valve and the associated potential for late structural valve deterioration and failure.  Expectations regarding the patient's postoperative convalescence were discussed at length.   The potential advantages and disadvantages associated with use of a rapid-deployment bioprosthetic aortic valve were discussed, including the risks of paravalvular leak, need for permanent pacemaker placement, and expectations for long-term durability.  This discussion was placed in the context of the patient's particular circumstances, and as a result the patient specifically requests that if his valve cannot be repaired then his valve would be replaced using a bioprosthetic tissue valve .  The patient understands and  accepts all potential associated risks of surgery including but not limited to risk of death, stroke, myocardial infarction, congestive heart failure, respiratory failure, renal failure, pneumonia, bleeding requiring blood transfusion and or reexploration, arrhythmia, heart block or bradycardia requiring permanent pacemaker, aortic dissection or other major vascular complication, pleural effusions or other delayed complications related to continued congestive heart failure, and other late complications related to valve replacement including recurrent aortic insufficiency, structural valve deterioration and failure, thrombosis, endocarditis, or paravalvular leak.     I spent in excess of 25 minutes during the conduct of this office consultation and >50% of this time  involved direct face-to-face encounter with the patient for counseling and/or coordination of their care.    Valentina Gu. Roxy Manns, MD 08/06/2019 4:03 PM

## 2019-08-07 ENCOUNTER — Encounter (HOSPITAL_COMMUNITY)
Admission: RE | Admit: 2019-08-07 | Discharge: 2019-08-07 | Disposition: A | Discharge status: Home / Self Care | Payer: BC Managed Care – PPO | Source: Ambulatory Visit | Attending: Thoracic Surgery (Cardiothoracic Vascular Surgery) | Admitting: Thoracic Surgery (Cardiothoracic Vascular Surgery)

## 2019-08-07 ENCOUNTER — Encounter (HOSPITAL_COMMUNITY): Payer: Self-pay

## 2019-08-07 ENCOUNTER — Other Ambulatory Visit (HOSPITAL_COMMUNITY)
Admission: RE | Admit: 2019-08-07 | Discharge: 2019-08-07 | Disposition: A | Discharge status: Home / Self Care | Payer: BC Managed Care – PPO | Source: Ambulatory Visit | Attending: Thoracic Surgery (Cardiothoracic Vascular Surgery) | Admitting: Thoracic Surgery (Cardiothoracic Vascular Surgery)

## 2019-08-07 ENCOUNTER — Other Ambulatory Visit: Payer: Self-pay

## 2019-08-07 ENCOUNTER — Ambulatory Visit: Payer: BC Managed Care – PPO | Admitting: Podiatry

## 2019-08-07 ENCOUNTER — Ambulatory Visit (HOSPITAL_BASED_OUTPATIENT_CLINIC_OR_DEPARTMENT_OTHER)
Admission: RE | Admit: 2019-08-07 | Discharge: 2019-08-07 | Disposition: A | Discharge status: Home / Self Care | Payer: BC Managed Care – PPO | Source: Ambulatory Visit | Attending: Thoracic Surgery (Cardiothoracic Vascular Surgery) | Admitting: Thoracic Surgery (Cardiothoracic Vascular Surgery)

## 2019-08-07 DIAGNOSIS — R079 Chest pain, unspecified: Secondary | ICD-10-CM | POA: Diagnosis not present

## 2019-08-07 DIAGNOSIS — I2510 Atherosclerotic heart disease of native coronary artery without angina pectoris: Secondary | ICD-10-CM | POA: Diagnosis not present

## 2019-08-07 DIAGNOSIS — E785 Hyperlipidemia, unspecified: Secondary | ICD-10-CM | POA: Diagnosis not present

## 2019-08-07 DIAGNOSIS — Z4682 Encounter for fitting and adjustment of non-vascular catheter: Secondary | ICD-10-CM | POA: Diagnosis not present

## 2019-08-07 DIAGNOSIS — F17210 Nicotine dependence, cigarettes, uncomplicated: Secondary | ICD-10-CM | POA: Diagnosis not present

## 2019-08-07 DIAGNOSIS — I351 Nonrheumatic aortic (valve) insufficiency: Secondary | ICD-10-CM | POA: Insufficient documentation

## 2019-08-07 DIAGNOSIS — Z791 Long term (current) use of non-steroidal anti-inflammatories (NSAID): Secondary | ICD-10-CM | POA: Diagnosis not present

## 2019-08-07 DIAGNOSIS — D62 Acute posthemorrhagic anemia: Secondary | ICD-10-CM | POA: Diagnosis not present

## 2019-08-07 DIAGNOSIS — I080 Rheumatic disorders of both mitral and aortic valves: Secondary | ICD-10-CM | POA: Diagnosis not present

## 2019-08-07 DIAGNOSIS — Z01818 Encounter for other preprocedural examination: Secondary | ICD-10-CM | POA: Diagnosis not present

## 2019-08-07 DIAGNOSIS — J9811 Atelectasis: Secondary | ICD-10-CM | POA: Diagnosis not present

## 2019-08-07 DIAGNOSIS — I10 Essential (primary) hypertension: Secondary | ICD-10-CM | POA: Diagnosis not present

## 2019-08-07 DIAGNOSIS — I4510 Unspecified right bundle-branch block: Secondary | ICD-10-CM | POA: Diagnosis not present

## 2019-08-07 DIAGNOSIS — Q211 Atrial septal defect: Secondary | ICD-10-CM | POA: Diagnosis not present

## 2019-08-07 DIAGNOSIS — I119 Hypertensive heart disease without heart failure: Secondary | ICD-10-CM | POA: Diagnosis not present

## 2019-08-07 DIAGNOSIS — Z885 Allergy status to narcotic agent status: Secondary | ICD-10-CM | POA: Diagnosis not present

## 2019-08-07 DIAGNOSIS — Z952 Presence of prosthetic heart valve: Secondary | ICD-10-CM | POA: Diagnosis not present

## 2019-08-07 DIAGNOSIS — Z20822 Contact with and (suspected) exposure to covid-19: Secondary | ICD-10-CM | POA: Diagnosis not present

## 2019-08-07 DIAGNOSIS — Z79899 Other long term (current) drug therapy: Secondary | ICD-10-CM | POA: Diagnosis not present

## 2019-08-07 DIAGNOSIS — I350 Nonrheumatic aortic (valve) stenosis: Secondary | ICD-10-CM | POA: Diagnosis not present

## 2019-08-07 DIAGNOSIS — D6959 Other secondary thrombocytopenia: Secondary | ICD-10-CM | POA: Diagnosis not present

## 2019-08-07 LAB — BLOOD GAS, ARTERIAL
Acid-base deficit: 1.1 mmol/L (ref 0.0–2.0)
Bicarbonate: 22.7 mmol/L (ref 20.0–28.0)
Drawn by: 421801
FIO2: 21
O2 Saturation: 98.5 %
Patient temperature: 37
pCO2 arterial: 35.8 mmHg (ref 32.0–48.0)
pH, Arterial: 7.419 (ref 7.350–7.450)
pO2, Arterial: 119 mmHg — ABNORMAL HIGH (ref 83.0–108.0)

## 2019-08-07 LAB — URINALYSIS, ROUTINE W REFLEX MICROSCOPIC
Bilirubin Urine: NEGATIVE
Glucose, UA: NEGATIVE mg/dL
Hgb urine dipstick: NEGATIVE
Ketones, ur: NEGATIVE mg/dL
Leukocytes,Ua: NEGATIVE
Nitrite: NEGATIVE
Protein, ur: NEGATIVE mg/dL
Specific Gravity, Urine: 1.016 (ref 1.005–1.030)
pH: 7 (ref 5.0–8.0)

## 2019-08-07 LAB — TYPE AND SCREEN
ABO/RH(D): O POS
Antibody Screen: NEGATIVE

## 2019-08-07 LAB — COMPREHENSIVE METABOLIC PANEL
ALT: 36 U/L (ref 0–44)
AST: 23 U/L (ref 15–41)
Albumin: 3.8 g/dL (ref 3.5–5.0)
Alkaline Phosphatase: 52 U/L (ref 38–126)
Anion gap: 10 (ref 5–15)
BUN: 15 mg/dL (ref 6–20)
CO2: 19 mmol/L — ABNORMAL LOW (ref 22–32)
Calcium: 9.2 mg/dL (ref 8.9–10.3)
Chloride: 109 mmol/L (ref 98–111)
Creatinine, Ser: 0.76 mg/dL (ref 0.61–1.24)
GFR calc Af Amer: >60 mL/min (ref >60)
GFR calc non Af Amer: >60 mL/min (ref >60)
Glucose, Bld: 92 mg/dL (ref 70–99)
Potassium: 4.6 mmol/L (ref 3.5–5.1)
Sodium: 138 mmol/L (ref 135–145)
Total Bilirubin: 0.8 mg/dL (ref 0.3–1.2)
Total Protein: 6.9 g/dL (ref 6.5–8.1)

## 2019-08-07 LAB — CBC
HCT: 47.2 % (ref 39.0–52.0)
Hemoglobin: 15.2 g/dL (ref 13.0–17.0)
MCH: 31.2 pg (ref 26.0–34.0)
MCHC: 32.2 g/dL (ref 30.0–36.0)
MCV: 96.9 fL (ref 80.0–100.0)
Platelets: 183 10*3/uL (ref 150–400)
RBC: 4.87 MIL/uL (ref 4.22–5.81)
RDW: 11.8 % (ref 11.5–15.5)
WBC: 6.4 10*3/uL (ref 4.0–10.5)
nRBC: 0 % (ref 0.0–0.2)

## 2019-08-07 LAB — SARS CORONAVIRUS 2 (TAT 6-24 HRS): SARS Coronavirus 2: NEGATIVE

## 2019-08-07 LAB — PROTIME-INR
INR: 1 (ref 0.8–1.2)
Prothrombin Time: 12.6 seconds (ref 11.4–15.2)

## 2019-08-07 LAB — SURGICAL PCR SCREEN
MRSA, PCR: NEGATIVE
Staphylococcus aureus: NEGATIVE

## 2019-08-07 LAB — APTT: aPTT: 27 seconds (ref 24–36)

## 2019-08-07 LAB — ABO/RH: ABO/RH(D): O POS

## 2019-08-07 NOTE — Progress Notes (Addendum)
PCP - Dr. Samuel Germany primary care Cardiologist - Dr. Darlys Gales / Dr. Darylene Price  PPM/ICD - Denies  Chest x-ray - 08/07/19 EKG - 08/07/19 Stress Test - Denies ECHO - 07/13/19 Cardiac Cath - 07/26/19  Sleep Study - Denies  COVID TEST- 08/07/19  Anesthesia review: Yes - cardiac hx / abnormal EKG RBBB  Patient denies shortness of breath, fever, cough and chest pain at PAT appointment  All instructions explained to the patient, with a verbal understanding of the material. Patient agrees to go over the instructions while at home for a better understanding. Patient also instructed to self quarantine after being tested for COVID-19. The opportunity to ask questions was provided.

## 2019-08-08 LAB — HEMOGLOBIN A1C
Hgb A1c MFr Bld: 5.4 % (ref 4.8–5.6)
Mean Plasma Glucose: 108 mg/dL

## 2019-08-08 MED ORDER — INSULIN REGULAR(HUMAN) IN NACL 100-0.9 UT/100ML-% IV SOLN
INTRAVENOUS | Status: DC
  Filled 2019-08-08: qty 100

## 2019-08-08 MED ORDER — SODIUM CHLORIDE 0.9 % IV SOLN
1.5000 g | INTRAVENOUS | Status: DC
  Filled 2019-08-08: qty 1.5

## 2019-08-08 MED ORDER — PHENYLEPHRINE HCL-NACL 20-0.9 MG/250ML-% IV SOLN
30.0000 ug/min | INTRAVENOUS | Status: DC
  Filled 2019-08-08: qty 250

## 2019-08-08 MED ORDER — DEXMEDETOMIDINE HCL IN NACL 400 MCG/100ML IV SOLN
0.1000 ug/kg/h | INTRAVENOUS | Status: DC
  Filled 2019-08-08: qty 100

## 2019-08-08 MED ORDER — TRANEXAMIC ACID (OHS) PUMP PRIME SOLUTION
2.0000 mg/kg | INTRAVENOUS | Status: DC
  Filled 2019-08-08: qty 1.77

## 2019-08-08 MED ORDER — POTASSIUM CHLORIDE 2 MEQ/ML IV SOLN
80.0000 meq | INTRAVENOUS | Status: DC
  Filled 2019-08-08: qty 40

## 2019-08-08 MED ORDER — NITROGLYCERIN IN D5W 200-5 MCG/ML-% IV SOLN
2.0000 ug/min | INTRAVENOUS | Status: DC
  Filled 2019-08-08: qty 250

## 2019-08-08 MED ORDER — SODIUM CHLORIDE 0.9 % IV SOLN
INTRAVENOUS | Status: DC
  Filled 2019-08-08: qty 30

## 2019-08-08 MED ORDER — PLASMA-LYTE 148 IV SOLN
INTRAVENOUS | Status: DC
  Filled 2019-08-08: qty 2.5

## 2019-08-08 MED ORDER — SODIUM CHLORIDE 0.9 % IV SOLN
750.0000 mg | INTRAVENOUS | Status: DC
  Filled 2019-08-08: qty 750

## 2019-08-08 MED ORDER — MILRINONE LACTATE IN DEXTROSE 20-5 MG/100ML-% IV SOLN
0.3000 ug/kg/min | INTRAVENOUS | Status: DC
  Filled 2019-08-08: qty 100

## 2019-08-08 MED ORDER — TRANEXAMIC ACID (OHS) BOLUS VIA INFUSION
15.0000 mg/kg | INTRAVENOUS | Status: AC
  Administered 2019-08-09: 1324.5 mg via INTRAVENOUS
  Filled 2019-08-08: qty 1325

## 2019-08-08 MED ORDER — EPINEPHRINE HCL 5 MG/250ML IV SOLN IN NS
0.0000 ug/min | INTRAVENOUS | Status: DC
  Filled 2019-08-08: qty 250

## 2019-08-08 MED ORDER — VANCOMYCIN HCL 1500 MG/300ML IV SOLN
1500.0000 mg | INTRAVENOUS | Status: DC
  Filled 2019-08-08: qty 300

## 2019-08-08 MED ORDER — TRANEXAMIC ACID 1000 MG/10ML IV SOLN
1.5000 mg/kg/h | INTRAVENOUS | Status: DC
  Filled 2019-08-08: qty 25

## 2019-08-08 MED ORDER — VANCOMYCIN HCL 1000 MG IV SOLR
INTRAVENOUS | Status: DC
  Filled 2019-08-08: qty 1000

## 2019-08-08 MED ORDER — NOREPINEPHRINE 4 MG/250ML-% IV SOLN
0.0000 ug/min | INTRAVENOUS | Status: DC
  Filled 2019-08-08: qty 250

## 2019-08-08 MED ORDER — MANNITOL 20 % IV SOLN
INTRAVENOUS | Status: DC
  Filled 2019-08-08 (×2): qty 13

## 2019-08-08 NOTE — H&P (Signed)
[address]       [place] [address]             [phone]          CARDIOTHORACIC SURGERY HISTORY AND PHYSICAL EXAM  Referring Provider is Satira Sark, MD  PCP is Perlie Mayo, NP    HPI:  Chief Complaint  Patient presents with  . Aortic Insuffiency    Surgical eval, TEE 07/05/19, ECHO 06/28/19,   Patient is a 54 year old male with no previous cardiac history who has been referred for surgical consultation to discuss recently diagnosed severe aortic insufficiency.  Patient states that he was told when he was younger there is a possible history of rheumatic fever.  He has not previously been told that he had a heart murmur.  He recently changed primary care physicians and was found to have a abnormal EKG which prompted referral for a formal cardiology consultation.  The patient was seen by Dr. [name] on June 28, 2019 and EKG revealed sinus rhythm with occasional PVC and right bundle branch block.  Transthoracic echocardiogram was performed June 28, 2019 and revealed moderate global left ventricular systolic dysfunction with ejection fraction estimated only 30 to 35%.  There was moderate to severe left ventricular chamber enlargement with end-diastolic left ventricular internal diameter reported 6.9 cm.  There was prolapse of what was reported to be the noncoronary cusp of the aortic valve with at least moderate to severe aortic insufficiency.  Aortic valve pressure half-time was reported 234 ms.  TEE was performed July 05, 2019 and confirmed the presence of a trileaflet aortic valve with severe aortic insufficiency with isolated prolapse involving the left coronary cusp.  Left ventricular systolic function was moderately reduced with ejection fraction estimated 30 to 35%.  There was left ventricular chamber enlargement although neither end-systolic nor end-diastolic chamber dimensions were reported.  There was a small patent foramen ovale.   Cardiothoracic surgical consultation was requested.  Patient is married and lives with his wife in [place].  He has a total of 4 children, 3 from previous marriage.  His youngest child is 85 months old.  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  This job requires significant strenuous activity with occasional lifting.  The patient states that he is on his feet all day long at work.  He specifically denies any symptoms of exertional shortness of breath or chest discomfort.  He has not noticed any significant change in his exercise tolerance.  He denies any history of PND, orthopnea, or lower extremity edema.  He has not had palpitations, dizzy spells, nor syncope.  He admits to significant alcohol consumption and he has a long history of tobacco use.  He reports no significant physical limitations.  Patient was originally seen in consultation on July 12, 2019.  Since then he underwent diagnostic cardiac catheterization on July 26, 2019.  He was found to have mild nonobstructive coronary artery disease.  Right heart pressures were normal.  Cardiac gated CT angiogram of the heart confirmed the presence of a trileaflet aortic valve with isolated prolapse of the noncoronary leaflet.  The aortic annulus was dilated but there was no significant aneurysmal enlargement of the aortic root or a sending thoracic aorta.  CT angiogram of the chest, abdomen, and pelvis revealed no significant abnormalities other than incidental finding of a small (3 mm) left renal calculus.  Patient returns to the office today  for follow-up with tentative plans to proceed with elective aortic valve repair later this week.  He reports no new problems or complaints.  He did receive the first dose of the COVID-19 vaccine and is scheduled to receive his second dose in early May.  Patient states that he has cut back significantly on cigarette smoking although he has still smokes an  occasional cigarette.   Past Medical History:  Diagnosis Date  . Bone tumor 11/22/2013  . Glenoid labral tear 10/27/2010  . Hyperlipidemia   . Rotator cuff tear, right 10/27/2010  . Shoulder injury 10/27/2010    Past Surgical History:  Procedure Laterality Date  . BACK SURGERY     multiple back surgery  . Benign tumor     Left shin  . BUBBLE STUDY  07/05/2019   Procedure: BUBBLE STUDY;  Surgeon: Satira Sark, MD;  Location: [facility];  Service: Cardiovascular;;  . HERNIA REPAIR    . RIGHT/LEFT HEART CATH AND CORONARY ANGIOGRAPHY N/A 07/26/2019   Procedure: RIGHT/LEFT HEART CATH AND CORONARY ANGIOGRAPHY;  Surgeon: Burnell Blanks, MD;  Location: [facility];  Service: Cardiovascular;  Laterality: N/A;  . SHOULDER SURGERY Left   . SHOULDER SURGERY Right   . TEE WITHOUT CARDIOVERSION N/A 07/05/2019   Procedure: TRANSESOPHAGEAL ECHOCARDIOGRAM (TEE) WITH PROPOFOL;  Surgeon: Satira Sark, MD;  Location: [facility];  Service: Cardiovascular;  Laterality: N/A;  . Thumb surgery Left x3    Family History  Problem Relation Age of Onset  . Other Father        Kidney    Social History Social History   Tobacco Use  . Smoking status: Current Every Day Smoker    Packs/day: 0.25    Types: Cigarettes  . Smokeless tobacco: Never Used  . Tobacco comment: Cutting down < 5 cig / day  Substance Use Topics  . Alcohol use: Yes    Alcohol/week: 12.0 standard drinks    Types: 12 Shots of liquor per week    Comment: on Friday and Saturday  . Drug use: No    Prior to Admission medications   Medication Sig Start Date End Date Taking? Authorizing Provider  atorvastatin (LIPITOR) 10 MG tablet Take 10 mg by mouth every evening.  05/09/19  Yes [provider]  Cholecalciferol (VITAMIN D3) 50 MCG (2000 UT) TABS Take 2,000 Units by mouth daily.   Yes [provider]  Coenzyme Q10 (COQ10) 100 MG CAPS Take 100 mg by mouth daily.   Yes [provider]  ibuprofen  (ADVIL) 200 MG tablet Take 400 mg by mouth every 8 (eight) hours as needed (pain.).   Yes [provider]  Multiple Vitamin (MULTIVITAMIN WITH MINERALS) TABS tablet Take 1 tablet by mouth daily.   Yes [provider]  Omega-3 Fatty Acids (FISH OIL PO) Take 2,500 mg by mouth daily.   Yes [provider]  metoprolol tartrate (LOPRESSOR) 50 MG tablet Take one time dose 100mg  metoprolol tartrate 2 hr prior to CT scan for HR > 55 beats per minute. 07/12/19   Rexene Alberts, MD    Allergies  Allergen Reactions  . Ultram [Tramadol Hcl] Itching    "Tunnel vision" warm feeling all over and wanted to jump out of his skin     Review of Systems:              General:  normal appetite, normal energy, no weight gain, no weight loss, no fever             Cardiac:                       no chest pain with exertion, no chest pain at rest, no SOB with exertion, no resting SOB, no PND, no orthopnea, no palpitations, no arrhythmia, no atrial fibrillation, no LE edema, no dizzy spells, no syncope             Respiratory:                 no shortness of breath, no home oxygen, no productive cough, no dry cough, no bronchitis, no wheezing, no hemoptysis, no asthma, no pain with inspiration or cough, no sleep apnea, no CPAP at night             GI:                               no difficulty swallowing, no reflux, no frequent heartburn, no hiatal hernia, no abdominal pain, no constipation, no diarrhea, no hematochezia, no hematemesis, no melena             GU:                              no dysuria,  no frequency, no urinary tract infection, no hematuria, no enlarged prostate, no kidney stones, no kidney disease             Vascular:                     no pain suggestive of claudication, + nocturnal pain in feet, occasional leg cramps, no varicose veins, no DVT, no non-healing foot ulcer             Neuro:                         no stroke, no TIA's, no seizures, no  headaches, no temporary blindness one eye,  no slurred speech, no peripheral neuropathy, no chronic pain, no instability of gait, no memory/cognitive dysfunction             Musculoskeletal:         no arthritis, no joint swelling, no myalgias, no difficulty walking, normal mobility              Skin:                            no rash, no itching, no skin infections, no pressure sores or ulcerations             Psych:                         no anxiety, no depression, no nervousness, no unusual recent stress             Eyes:                           no blurry vision, no floaters, no recent vision changes, + wears glasses for reading and driving only             ENT:  no hearing loss, no loose or painful teeth, no dentures, last saw dentist February 2021             Hematologic:               no easy bruising, no abnormal bleeding, no clotting disorder, no frequent epistaxis             Endocrine:                   no diabetes, does not check CBG's at home                           Physical Exam:              BP 140/60   Pulse 76   Resp 20   Ht 6\' 2"  (1.88 m)   Wt 197 lb (89.4 kg)   SpO2 94% Comment: RA  BMI 25.29 kg/m              General:                      Thin,  well-appearing             HEENT:                       Unremarkable              Neck:                           no JVD, no bruits, no adenopathy              Chest:                          clear to auscultation, symmetrical breath sounds, no wheezes, no rhonchi              CV:                              RRR, grade III/VI holodiastolic murmur              Abdomen:                    soft, non-tender, no masses              Extremities:                 warm, well-perfused, pulses palpable - water hammer, no LE edema             Rectal/GU                   Deferred             Neuro:                         Grossly non-focal and symmetrical throughout             Skin:                             Clean and dry, no rashes, no breakdown   Diagnostic Tests:   ECHOCARDIOGRAM REPORT       Patient Name:  [name] Date of Exam: 06/28/2019  Medical Rec #: [id number]   Height:    74.0 in  Accession #:  [id number]  Weight:    202.0 lb  Date of Birth: [birth year]  BSA:     2.183 m  Patient Age:  37 years   BP:      157/75 mmHg  Patient Gender: M       HR:      79 bpm.  Exam Location: [facility]   Procedure: 2D Echo, Cardiac Doppler and Color Doppler   Indications:  I45.10 (ICD-10-CM) - RBBB (right bundle branch block)         I49.3 (ICD-10-CM) - PVC (premature ventricular  contraction)         I51.7 (ICD-10-CM) - LVH (left ventricular hypertrophy)         R94.31 (ICD-10-CM) - Abnormal EKG    History:    Patient has no prior history of Echocardiogram  examinations.         Arrythmias:RBBB; Risk Factors:Hypertension, Dyslipidemia  and         Current Smoker. LVH (left ventricular hypertrophy),RBBB  (right         bundle branch block).    Sonographer:  BW  Referring Phys: [place]    1. Left ventricular ejection fraction, by estimation, is 30 to 35%. The  left ventricle has moderately decreased function. The left ventricle  demonstrates global hypokinesis. The left ventricular internal cavity size  was moderately to severely dilated.  There is moderate left ventricular hypertrophy. Left ventricular diastolic  parameters are indeterminate.  2. Right ventricular systolic function is normal. The right ventricular  size is normal.  3. Left atrial size was severely dilated.  4. The mitral valve is normal in structure and function. No evidence of  mitral valve regurgitation. No evidence of mitral stenosis.  5. Prolapse of the aortic valve non coronary cusp with resultant  eccentric regurgitation. The jet is eccentric and not able  to measure vena  contracta. By PHT (right around 200) AI qualifies as moderate to severe.  Even though the jet is very eccentric by  color alone in several views looks to be severe. The Doppler of the  aortic arch is limited, cannot verify degree of diastolic flow reversal.  Given degree of LV dilatation and LV dysfunction consider TEE to better  quantify severity of AI and precise  anatomy of the aortic valve. . The aortic valve has an indeterminant  number of cusps. Aortic valve regurgitation is moderate to severe. No  aortic stenosis is present.  6. Aortic dilatation noted. There is mild dilatation of the aortic root  measuring 40 mm.  7. The inferior vena cava is normal in size with greater than 50%  respiratory variability, suggesting right atrial pressure of 3 mmHg.   FINDINGS  Left Ventricle: Left ventricular ejection fraction, by estimation, is 30  to 35%. The left ventricle has moderately decreased function. The left  ventricle demonstrates global hypokinesis. The left ventricular internal  cavity size was moderately to  severely dilated. There is moderate left ventricular hypertrophy. Left  ventricular diastolic parameters are indeterminate. Normal left  ventricular filling pressure.   Right Ventricle: The right ventricular size is normal. No increase in  right ventricular wall thickness. Right ventricular systolic function is  normal.   Left Atrium: Left atrial size was severely dilated.   Right Atrium: Right atrial size was normal in size.  Pericardium: There is no evidence of pericardial effusion.   Mitral Valve: The mitral valve is normal in structure and function. No  evidence of mitral valve regurgitation. No evidence of mitral valve  stenosis.   Tricuspid Valve: The tricuspid valve is normal in structure. Tricuspid  valve regurgitation is not demonstrated. No evidence of tricuspid  stenosis.   Aortic Valve: Prolapse of the aortic valve non coronary cusp  with  resultant eccentric regurgitation. The jet is eccentric and not able to  measure vena contracta. By PHT (right around 200) AI qualifies as moderate  to severe. Even though the jet is very  eccentric by color alone in several views looks to be severe. The Doppler  of the aortic arch is limited, cannot verify degree of diastolic flow  reversal. Given degree of LV dilatation and LV dysfunction consider TEE to  better quantify severity of AI and  precise anatomy of the aortic valve. The aortic valve has an indeterminant  number of cusps. Aortic valve regurgitation is moderate to severe. Aortic  regurgitation PHT measures 234 msec. No aortic stenosis is present. Aortic  valve mean gradient measures  7.0 mmHg. Aortic valve peak gradient measures 12.4 mmHg. Aortic valve  area, by VTI measures 2.33 cm.   Pulmonic Valve: The pulmonic valve was not well visualized. Pulmonic valve  regurgitation is mild. No evidence of pulmonic stenosis.   Aorta: Aortic dilatation noted. There is mild dilatation of the aortic  root measuring 40 mm.   Pulmonary Artery: Indeterminate PASP, inadequate TR jet.   Venous: The inferior vena cava is normal in size with greater than 50%  respiratory variability, suggesting right atrial pressure of 3 mmHg.   IAS/Shunts: No atrial level shunt detected by color flow Doppler.     LEFT VENTRICLE  PLAX 2D  LVIDd:     6.90 cm   Diastology  LVIDs:     6.20 cm   LV e' lateral:  5.55 cm/s  LV PW:     1.50 cm   LV E/e' lateral: 14.8  LV IVS:    1.49 cm   LV e' medial:  9.30 cm/s  LVOT diam:   2.50 cm   LV E/e' medial: 8.8  LV SV:     85  LV SV Index:  39  LVOT Area:   4.91 cm    LV Volumes (MOD)  LV vol d, MOD A2C: 280.0 ml  LV vol d, MOD A4C: 367.0 ml  LV vol s, MOD A2C: 176.0 ml  LV vol s, MOD A4C: 233.0 ml  LV SV MOD A2C:   104.0 ml  LV SV MOD A4C:   367.0 ml  LV SV MOD BP:   113.9 ml   RIGHT VENTRICLE   RV S prime:   11.70 cm/s  TAPSE (M-mode): 2.3 cm   LEFT ATRIUM       Index    RIGHT ATRIUM      Index  LA diam:    3.50 cm 1.60 cm/m RA Area:   16.60 cm  LA Vol (A2C):  125.0 ml 57.27 ml/m RA Volume:  43.00 ml 19.70 ml/m  LA Vol (A4C):  94.6 ml 43.34 ml/m  LA Biplane Vol: 120.0 ml 54.98 ml/m  AORTIC VALVE  AV Area (Vmax):  2.30 cm  AV Area (Vmean):  2.39 cm  AV Area (VTI):   2.33 cm  AV Vmax:      176.00 cm/s  AV Vmean:     125.000 cm/s  AV VTI:      0.364 m  AV Peak Grad:   12.4 mmHg  AV Mean Grad:   7.0 mmHg  LVOT Vmax:     82.40 cm/s  LVOT Vmean:    60.800 cm/s  LVOT VTI:     0.173 m  LVOT/AV VTI ratio: 0.48  AI PHT:      234 msec    AORTA  Ao Root diam: 3.90 cm   MITRAL VALVE  MV Area (PHT): 3.45 cm  SHUNTS  MV Decel Time: 220 msec  Systemic VTI: 0.17 m  MV E velocity: 82.10 cm/s Systemic Diam: 2.50 cm  MV A velocity: 80.97 cm/s  MV E/A ratio: 1.01   Carlyle Dolly MD  Electronically signed by Carlyle Dolly MD  Signature Date/Time: 06/28/2019/12:40:58 PM      TRANSESOPHOGEAL ECHO REPORT     Patient Name:  [name] Date of Exam: 07/05/2019  Medical Rec #: [id number]   Height:    74.0 in  Accession #:  [id number]  Weight:    197.0 lb  Date of Birth: October 06, 1965  BSA:     2.160 m  Patient Age:  85 years   BP:      117/51 mmHg  Patient Gender: M       HR:      69 bpm.  Exam Location: [facility]   Procedure: Transesophageal Echo   Indications:  Aortic valve disorder 424.1 / I35.9    History:    Patient has prior history of Echocardiogram examinations,  most         recent 06/28/2019. Abnormal ECG; Risk Factors:Hypertension,         Dyslipidemia and Current Smoker. RBBB, LVH.    Sonographer:  Leavy Cella RDCS (AE)  Referring Phys: [place]: TEE procedure  time was 15 minutes. The transesophogeal probe  was passed without difficulty through the esophogus of the patient. Imaged  were obtained with the patient in a left lateral decubitus position.  Sedation performed by different  physician. The patient was monitored while under deep sedation.  Anesthestetic sedation was provided intravenously by Anesthesiology: 80mg   of Propofol. Image quality was good. The patient's vital signs; including  heart rate, blood pressure, and oxygen  saturation; remained stable throughout the procedure. The patient  developed no complications during the procedure.   IMPRESSIONS    1. Left ventricular ejection fraction, by estimation, is 30 to 35%. The  left ventricle has moderately decreased function. The left ventricle  demonstrates global hypokinesis. The left ventricular internal cavity size  was mildly dilated. There is moderate  left ventricular hypertrophy. Left ventricular diastolic function could  not be evaluated.  2. Right ventricular systolic function is normal. The right ventricular  size is normal.  3. Left atrial size was dilated. No left atrial/left atrial appendage  thrombus was detected. The LAA emptying velocity was 70 cm/s.  4. The mitral valve is grossly normal. Mild mitral valve regurgitation.  5. The aortic valve is tricuspid. There is bowing/prolapse of the  noncoronary cusp, likely incomplete coaptation with left coronary cusp  (cannot completely exclude a fenestration in the left coronary cusp).  Aortic valve regurgitation is very eccentric  with jet along the septum. Vena contracta is 0.8 cm. There is flow  reversal noted in the aortic arch and proximal descending aorta. Aortic  regurgitation is severe.  6. Evidence of atrial level shunting detected by color flow Doppler.  There is  a small patent foramen ovale with predominantly left to right  shunting across the atrial septum.   FINDINGS  Left Ventricle: Left  ventricular ejection fraction, by estimation, is 30  to 35%. The left ventricle has moderately decreased function. The left  ventricle demonstrates global hypokinesis. The left ventricular internal  cavity size was mildly dilated.  There is moderate left ventricular hypertrophy. Left ventricular diastolic  function could not be evaluated.   Right Ventricle: The right ventricular size is normal. No increase in  right ventricular wall thickness. Right ventricular systolic function is  normal.   Left Atrium: Left atrial size was dilated. No left atrial/left atrial  appendage thrombus was detected. The LAA emptying velocity was 70 cm/s.   Right Atrium: Right atrial size was normal in size. Prominent Eustachian  valve.   Pericardium: There is no evidence of pericardial effusion.   Mitral Valve: The mitral valve is grossly normal. Mild mitral valve  regurgitation.   Tricuspid Valve: The tricuspid valve is grossly normal. Tricuspid valve  regurgitation is trivial.   Aortic Valve: The aortic valve is tricuspid. Aortic valve regurgitation is  severe.   Pulmonic Valve: The pulmonic valve was not assessed. Pulmonic valve  regurgitation not assessed.   Aorta: The aortic root is normal in size and structure.   IAS/Shunts: Evidence of atrial level shunting detected by color flow  Doppler. Agitated saline contrast was given intravenously to evaluate for  intracardiac shunting. A small patent foramen ovale is detected with  predominantly left to right shunting  across the atrial septum.   Rozann Lesches MD  Electronically signed by Rozann Lesches MD  Signature Date/Time: 07/05/2019/11:43:31 AM      RIGHT/LEFT HEART CATH AND CORONARY ANGIOGRAPHY     Conclusion  Prox RCA lesion is 20% stenosed.   Dist RCA lesion is 10% stenosed.   3rd Mrg lesion is 10% stenosed.   Prox LAD to Mid LAD lesion is 20% stenosed.   1st Diag lesion is 30% stenosed. 1. Mild non-obstructive CAD    2. Severe aortic valve insufficiency.  Recommendations: Continue planning for aortic valve repair.      Recommendations Antiplatelet/Anticoag Continue planning for aortic valve repair.     Surgeon Notes   07/05/2019 10:57 AM [facility] Procedure signed by Satira Sark, MD     Indications Severe aortic insufficiency [I35.1 (ICD-10-CM)]     Procedural Details Technical Details Indication: 54 yo male with severe AI. Planning for surgical repair.   Procedure: The risks, benefits, complications, treatment options, and expected outcomes were discussed with the patient. The patient and/or family concurred with the proposed plan, giving informed consent. The patient was brought to the cath lab after IV hydration was given. The patient was sedated with Versed and Fentanyl.The IV catheter present in the right antecubital vein was changed for a 5 [place] sheath. Right heart catheterization performed with a balloon tipped catheter. The right wrist was prepped and draped in a sterile fashion. 1% lidocaine was used for local anesthesia. Using the modified Seldinger access technique, a 5 French sheath was placed in the right radial artery. 3 mg Verapamil was given through the sheath. 4000 units IV heparin was given. Standard diagnostic catheters were used to perform selective coronary angiography. LV pressures measured with the JR4 catheter. The sheath was removed from the right radial artery and a Terumo hemostasis band was applied at the arteriotomy site on the right wrist.    Estimated blood loss <50 mL.   During this  procedure medications were administered to achieve and maintain moderate conscious sedation while the patient's heart rate, blood pressure, and oxygen saturation were continuously monitored and I was present face-to-face 100% of this time.       Complications    Complications documented before study signed (07/26/2019 9:59 AM)  RIGHT/LEFT HEART CATH AND CORONARY ANGIOGRAPHY   None Documented by Burnell Blanks, MD 07/26/2019 9:52 AM  Date Found: 07/26/2019  Time Range: Intraprocedure       Coronary Findings Diagnostic Dominance: Right  Left Anterior Descending  Vessel is large.  Prox LAD to Mid LAD lesion 20% stenosed  Prox LAD to Mid LAD lesion is 20% stenosed.   First Diagonal Branch  1st Diag lesion 30% stenosed  1st Diag lesion is 30% stenosed.   Left Circumflex  Vessel is large.   Third Obtuse Marginal Branch  3rd Mrg lesion 10% stenosed  3rd Mrg lesion is 10% stenosed.   Right Coronary Artery  Vessel is large.  Prox RCA lesion 20% stenosed  Prox RCA lesion is 20% stenosed.  Dist RCA lesion 10% stenosed  Dist RCA lesion is 10% stenosed.  Intervention No interventions have been documented.   Coronary Diagrams Diagnostic Dominance: Right  &&&&&  Intervention      Implants  No implant documentation for this case.      [facility] Images Link to Procedure Log  Show images for CARDIAC CATHETERIZATION Procedure Log     Images on Long Term Storage   Show images for Channer, Bielawski "Chris"         Hemo Data   Most Recent Value  Fick Cardiac Output 7.08 L/min  Fick Cardiac Output Index 3.28 (L/min)/BSA  RA A Wave 10 mmHg  RA V Wave 7 mmHg  RA Mean 7 mmHg  RV Systolic Pressure 28 mmHg  RV Diastolic Pressure 4 mmHg  RV EDP 7 mmHg  PA Systolic Pressure 25 mmHg  PA Diastolic Pressure 13 mmHg  PA Mean 18 mmHg  PW A Wave 12 mmHg  PW V Wave 9 mmHg  PW Mean 7 mmHg  AO Systolic Pressure 97 mmHg  AO Diastolic Pressure 50 mmHg  AO Mean 72 mmHg  LV Systolic Pressure 123456 mmHg  LV Diastolic Pressure 14 mmHg  LV EDP 23 mmHg  AOp Systolic Pressure 99991111 mmHg  AOp Diastolic Pressure 49 mmHg  AOp Mean Pressure 73 mmHg  LVp Systolic Pressure 99991111 mmHg  LVp Diastolic Pressure 11 mmHg  LVp EDP Pressure 20 mmHg  QP/QS 1  TPVR Index 5.47 HRUI  TSVR Index 21.93 HRUI  PVR SVR Ratio 0.17  TPVR/TSVR Ratio 0.25     Cardiac TAVR CT  TECHNIQUE: The patient was scanned on a [employer]. A 120 kV retrospective scan was triggered in the descending thoracic aorta at 111 HU's. Gantry rotation speed was 250 msecs and collimation was .6 mm. No beta blockade or nitro were given. The 3D data set was reconstructed in 5% intervals of the R-R cycle. Systolic and diastolic phases were analyzed on a dedicated work station using MPR, MIP and VRT modes. The patient received 80 cc of contrast.  FINDINGS: Image quality: Excellent.  Noise artifact is: Limited.  Valve Morphology: The aortic valve is tricuspid. There is severe prolapse of the [place] which is the likely etiology of the severe regurgitation. The aortic valve leaflets are thickened but there is no leaflet calcification.  Aortic annular dimension:  Phase assessed: 10%  Annular area: 797 mm2  Annular perimeter: 102 mm  Max diameter: 34.1 mm  Min diameter: 31.3 mm  Annular and subannular calcification: No significant annular or subannular calcification.  Optimal coplanar projection: RAO 15 CAU 7  Coronary Artery Height above Annulus:  Left Main: 13.5 mm  Right Coronary: 21.1 mm  Sinus of Valsalva Measurements:  Non-coronary: 40 mm  Right-coronary: 40 mm  Left-coronary: 41 mm  Sinus of Valsalva Height:  Non-coronary: 28.8 mm  Right-coronary: 33 mm  Left-coronary: 22.7 mm  Sinotubular Junction: 32 mm  Ascending Thoracic Aorta: 35 mm  Coronary Arteries: CAC score of 339 which is 99th percentile for age- and sex-matched controls. Normal coronary origin. Right dominance. The study was performed without use of NTG and is insufficient for plaque evaluation. Refer to recent cardiac cath for coronary evaluation.  Cardiac Morphology:  Right Atrium: Right atrial size is within normal limits.  Right Ventricle: The right ventricular cavity is within normal limits.  Left Atrium:  Left atrial size is normal in size with no left atrial appendage filling defect. A small PFO is present.  Left Ventricle: The ventricular cavity size is severely dilated. There are no stigmata of prior infarction. There is no abnormal filling defect. LVEF is mildy reduced, 49%, with global hypokinesis.  Pulmonary arteries: Normal in size without proximal filling defect.  Pulmonary veins: Normal pulmonary venous drainage.  Pericardium: Normal thickness with no significant effusion or calcium present.  Mitral Valve: The mitral valve is normal structure without significant calcification.  Extra-cardiac findings: See attached radiology report for non-cardiac structures.  IMPRESSION: 1. Tricuspid aortic valve with severe prolapse of the non-coronary cusp which is the etiology of the severe regurgitation.  2. Large annulus (797 mm2) which is not appropriate for TAVR (surgical repair planned).  3. Small PFO.  4. Severely dilated left ventricular cavity.  5. CAC score of 339 which is 99th percentile for age- and sex-matched controls.    Electronically Signed By: Eleonore Chiquito On: 08/03/2019 17:53    CT ANGIOGRAPHY CHEST, ABDOMEN AND PELVIS  TECHNIQUE: Multidetector CT imaging through the chest, abdomen and pelvis was performed using the standard protocol during bolus administration of intravenous contrast. Multiplanar reconstructed images and MIPs were obtained and reviewed to evaluate the vascular anatomy.  CONTRAST: 166mL OMNIPAQUE IOHEXOL 350 MG/ML SOLN  COMPARISON: None.  FINDINGS: CTA CHEST FINDINGS  Cardiovascular: Cardiomegaly. No pericardial effusion. Central pulmonary artery branches unremarkable; the exam was not optimized for detection of pulmonary emboli. Scattered coronary calcifications. Adequate contrast opacification of the thoracic aorta with no evidence of dissection, aneurysm, or stenosis. Minimal atheromatous  plaque. No significant tortuosity.There is classic 3-vessel brachiocephalic arch anatomy without proximal stenosis.  Mediastinum/Nodes: No hilar or mediastinal adenopathy.  Lungs/Pleura: No pleural effusion. No pneumothorax. Lungs are clear.  Musculoskeletal: Spondylitic changes in the lower cervical spine. DJD in bilateral shoulders. No fracture or worrisome bone lesion.  Review of the MIP images confirms the above findings.  CTA ABDOMEN AND PELVIS FINDINGS  VASCULAR  Aorta: Minimal atheromatous plaque. No aneurysm, dissection, stenosis, or significant tortuosity.  Celiac: Patent without evidence of aneurysm, dissection, vasculitis or significant stenosis.  SMA: Patent without evidence of aneurysm, dissection, vasculitis or significant stenosis.  Renals: Both renal arteries are patent without evidence of aneurysm, dissection, vasculitis, fibromuscular dysplasia or significant stenosis.  IMA: Patent without evidence of aneurysm, dissection, vasculitis or significant stenosis.  Inflow: Mild tortuosity of the iliac arterial system with minimal calcified plaque. No aneurysm, dissection, or stenosis.  Veins: No obvious venous abnormality within  the limitations of this arterial phase study. Patent portal vein and bilateral renal veins.  Review of the MIP images confirms the above findings.  NON-VASCULAR  Hepatobiliary: No focal liver abnormality is seen. No gallstones, gallbladder wall thickening, or biliary dilatation.  Pancreas: Unremarkable. No pancreatic ductal dilatation or surrounding inflammatory changes.  Spleen: Normal in size without focal abnormality.  Adrenals/Urinary Tract: 3 mm calculus, lower pole left renal collecting system. No hydronephrosis. No renal mass. Adrenal glands unremarkable. Urinary bladder incompletely distended.  Stomach/Bowel: Stomach is within normal limits. Appendix appears normal. No evidence of bowel wall  thickening, distention, or inflammatory changes.  Lymphatic: No enlarged abdominal or pelvic lymph nodes.  Reproductive: Prostate enlargement, mild.  Other: No ascites. No free air.  Musculoskeletal: Lower lumbar spondylitic change. No fracture or worrisome bone lesion.  Review of the MIP images confirms the above findings.  IMPRESSION: 1. Negative for aortic dissection, aneurysm, or stenosis. 2. Mild tortuosity of the iliac arterial system with minimal calcified plaque. 3. Coronary calcifications. The severity of coronary artery disease and any potential stenosis cannot be assessed on this non-gated CT examination. 4. 3 mm left renal calculus.  Aortic Atherosclerosis (ICD10-I70.0).   Electronically Signed By: Lucrezia Europe M.D. On: 08/03/2019 15:49   Impression:  Patient has aortic valve prolapse with stage C2 severe asymptomatic aortic insufficiency associated with moderate to severe global left ventricular systolic dysfunction and significant left ventricular chamber enlargement. I have personally reviewed the patient's recent transthoracic and transesophageal echocardiograms, cardiac catheterization and CT angiograms. The patient's aortic valve is trileaflet. There is severe prolapse with an obvious fracture line involving the left coronary leaflet causing severe aortic insufficiency. There is moderate to severe left ventricular chamber enlargement and moderate to severe global left ventricular systolic dysfunction with ejection fraction estimated only 30 to 35%. Despite the fact that the patient remains entirely asymptomatichewould best be treated with elective surgical intervention.   Diagnostic cardiac catheterization is notable for the absence of significant coronary artery disease and normal right heart pressures.  CT angiography confirms the presence of isolated prolapse of the noncoronary leaflet with valve anatomy that would likely be amenable to  repair.    Plan:  The patientand his wife were counseled at length regarding treatment alternatives for management of severe aortic insufficiencyincluding continued medical therapy versus proceeding with aortic valve replacement in the near future. The natural history of aortic insufficiencywas reviewed, as was long term prognosis with medical therapy alone. The potential advantages of valve repair were discussed as well as questions about long-term durability. Discussion was held comparing the relative risks of mechanical valve replacement with need for lifelong anticoagulation versus use of a bioprosthetic tissue valve and the associated potential for late structural valve deterioration and failure.Expectations regarding the patient's postoperative convalescence were discussed at length.   The potential advantages and disadvantages associated with use of a rapid-deployment bioprosthetic aortic valve were discussed, including the risks of paravalvular leak, need for permanent pacemaker placement, and expectations for long-term durability.  This discussion was placed in the context of the patient's particular circumstances, and as a result the patient specifically requests that if his valve cannot be repaired then his valve would be replaced using a bioprosthetic tissue valve .  The patient understands and accepts all potential associated risks of surgery including but not limited to risk of death, stroke, myocardial infarction, congestive heart failure, respiratory failure, renal failure, pneumonia, bleeding requiring blood transfusion and or reexploration, arrhythmia, heart block or bradycardia requiring permanent pacemaker,  aortic dissection or other major vascular complication, pleural effusions or other delayed complications related to continued congestive heart failure, and other late complications related to valve replacement including recurrent aortic insufficiency, structural valve  deterioration and failure, thrombosis, endocarditis, or paravalvular leak.       Valentina Gu. Roxy Manns, MD 08/06/2019 4:03 PM

## 2019-08-09 ENCOUNTER — Encounter (HOSPITAL_COMMUNITY): Payer: Self-pay | Admitting: Thoracic Surgery (Cardiothoracic Vascular Surgery)

## 2019-08-09 ENCOUNTER — Encounter (HOSPITAL_COMMUNITY)
Admission: RE | Disposition: A | Discharge status: Home / Self Care | Payer: Self-pay | Source: Home / Self Care | Attending: Thoracic Surgery (Cardiothoracic Vascular Surgery)

## 2019-08-09 ENCOUNTER — Inpatient Hospital Stay (HOSPITAL_COMMUNITY): Payer: BC Managed Care – PPO

## 2019-08-09 ENCOUNTER — Inpatient Hospital Stay (HOSPITAL_COMMUNITY)
Admission: RE | Admit: 2019-08-09 | Discharge: 2019-08-13 | DRG: 220 | Disposition: A | Discharge status: Home / Self Care | Payer: BC Managed Care – PPO | Attending: Thoracic Surgery (Cardiothoracic Vascular Surgery) | Admitting: Thoracic Surgery (Cardiothoracic Vascular Surgery)

## 2019-08-09 ENCOUNTER — Inpatient Hospital Stay (HOSPITAL_COMMUNITY): Payer: BC Managed Care – PPO | Admitting: Certified Registered Nurse Anesthetist

## 2019-08-09 ENCOUNTER — Inpatient Hospital Stay (HOSPITAL_COMMUNITY): Payer: BC Managed Care – PPO | Admitting: Physician Assistant

## 2019-08-09 DIAGNOSIS — Z20822 Contact with and (suspected) exposure to covid-19: Secondary | ICD-10-CM | POA: Diagnosis present

## 2019-08-09 DIAGNOSIS — Z8774 Personal history of (corrected) congenital malformations of heart and circulatory system: Secondary | ICD-10-CM

## 2019-08-09 DIAGNOSIS — D62 Acute posthemorrhagic anemia: Secondary | ICD-10-CM | POA: Diagnosis not present

## 2019-08-09 DIAGNOSIS — Q211 Atrial septal defect: Secondary | ICD-10-CM | POA: Diagnosis not present

## 2019-08-09 DIAGNOSIS — E785 Hyperlipidemia, unspecified: Secondary | ICD-10-CM | POA: Diagnosis present

## 2019-08-09 DIAGNOSIS — Z885 Allergy status to narcotic agent status: Secondary | ICD-10-CM | POA: Diagnosis not present

## 2019-08-09 DIAGNOSIS — I351 Nonrheumatic aortic (valve) insufficiency: Secondary | ICD-10-CM | POA: Diagnosis present

## 2019-08-09 DIAGNOSIS — F17210 Nicotine dependence, cigarettes, uncomplicated: Secondary | ICD-10-CM | POA: Diagnosis present

## 2019-08-09 DIAGNOSIS — Z952 Presence of prosthetic heart valve: Secondary | ICD-10-CM

## 2019-08-09 DIAGNOSIS — I119 Hypertensive heart disease without heart failure: Secondary | ICD-10-CM | POA: Diagnosis present

## 2019-08-09 DIAGNOSIS — D6959 Other secondary thrombocytopenia: Secondary | ICD-10-CM | POA: Diagnosis not present

## 2019-08-09 DIAGNOSIS — Z79899 Other long term (current) drug therapy: Secondary | ICD-10-CM

## 2019-08-09 DIAGNOSIS — I2510 Atherosclerotic heart disease of native coronary artery without angina pectoris: Secondary | ICD-10-CM | POA: Diagnosis present

## 2019-08-09 DIAGNOSIS — J9811 Atelectasis: Secondary | ICD-10-CM | POA: Diagnosis not present

## 2019-08-09 DIAGNOSIS — Q2112 Patent foramen ovale: Secondary | ICD-10-CM

## 2019-08-09 DIAGNOSIS — Z791 Long term (current) use of non-steroidal anti-inflammatories (NSAID): Secondary | ICD-10-CM | POA: Diagnosis not present

## 2019-08-09 DIAGNOSIS — Z953 Presence of xenogenic heart valve: Secondary | ICD-10-CM

## 2019-08-09 DIAGNOSIS — I4510 Unspecified right bundle-branch block: Secondary | ICD-10-CM | POA: Diagnosis present

## 2019-08-09 HISTORY — PX: TEE WITHOUT CARDIOVERSION: SHX5443

## 2019-08-09 HISTORY — PX: AORTIC VALVE REPLACEMENT: SHX41

## 2019-08-09 HISTORY — DX: Personal history of (corrected) congenital malformations of heart and circulatory system: Z87.74

## 2019-08-09 HISTORY — PX: REPAIR OF PATENT FORAMEN OVALE: SHX6064

## 2019-08-09 HISTORY — DX: Presence of xenogenic heart valve: Z95.3

## 2019-08-09 LAB — POCT I-STAT 7, (LYTES, BLD GAS, ICA,H+H)
Acid-base deficit: 3 mmol/L — ABNORMAL HIGH (ref 0.0–2.0)
Acid-base deficit: 3 mmol/L — ABNORMAL HIGH (ref 0.0–2.0)
Acid-base deficit: 2 mmol/L (ref 0.0–2.0)
Acid-base deficit: 4 mmol/L — ABNORMAL HIGH (ref 0.0–2.0)
Acid-base deficit: 3 mmol/L — ABNORMAL HIGH (ref 0.0–2.0)
Acid-base deficit: 5 mmol/L — ABNORMAL HIGH (ref 0.0–2.0)
Bicarbonate: 25.3 mmol/L (ref 20.0–28.0)
Bicarbonate: 24.1 mmol/L (ref 20.0–28.0)
Bicarbonate: 24.3 mmol/L (ref 20.0–28.0)
Bicarbonate: 23.2 mmol/L (ref 20.0–28.0)
Bicarbonate: 22.7 mmol/L (ref 20.0–28.0)
Bicarbonate: 22.4 mmol/L (ref 20.0–28.0)
Calcium, Ion: 1.29 mmol/L (ref 1.15–1.40)
Calcium, Ion: 1.14 mmol/L — ABNORMAL LOW (ref 1.15–1.40)
Calcium, Ion: 1.13 mmol/L — ABNORMAL LOW (ref 1.15–1.40)
Calcium, Ion: 1.18 mmol/L (ref 1.15–1.40)
Calcium, Ion: 1.17 mmol/L (ref 1.15–1.40)
Calcium, Ion: 1.2 mmol/L (ref 1.15–1.40)
HCT: 39 % (ref 39.0–52.0)
HCT: 33 % — ABNORMAL LOW (ref 39.0–52.0)
HCT: 36 % — ABNORMAL LOW (ref 39.0–52.0)
HCT: 39 % (ref 39.0–52.0)
HCT: 38 % — ABNORMAL LOW (ref 39.0–52.0)
HCT: 42 % (ref 39.0–52.0)
Hemoglobin: 13.3 g/dL (ref 13.0–17.0)
Hemoglobin: 11.2 g/dL — ABNORMAL LOW (ref 13.0–17.0)
Hemoglobin: 12.2 g/dL — ABNORMAL LOW (ref 13.0–17.0)
Hemoglobin: 13.3 g/dL (ref 13.0–17.0)
Hemoglobin: 12.9 g/dL — ABNORMAL LOW (ref 13.0–17.0)
Hemoglobin: 14.3 g/dL (ref 13.0–17.0)
O2 Saturation: 100 %
O2 Saturation: 100 %
O2 Saturation: 100 %
O2 Saturation: 96 %
O2 Saturation: 95 %
O2 Saturation: 98 %
Patient temperature: 36.7
Patient temperature: 36.7
Patient temperature: 36.6
Potassium: 4.6 mmol/L (ref 3.5–5.1)
Potassium: 4.2 mmol/L (ref 3.5–5.1)
Potassium: 5 mmol/L (ref 3.5–5.1)
Potassium: 4.8 mmol/L (ref 3.5–5.1)
Potassium: 5.1 mmol/L (ref 3.5–5.1)
Potassium: 5.2 mmol/L — ABNORMAL HIGH (ref 3.5–5.1)
Sodium: 140 mmol/L (ref 135–145)
Sodium: 142 mmol/L (ref 135–145)
Sodium: 141 mmol/L (ref 135–145)
Sodium: 141 mmol/L (ref 135–145)
Sodium: 140 mmol/L (ref 135–145)
Sodium: 140 mmol/L (ref 135–145)
TCO2: 27 mmol/L (ref 22–32)
TCO2: 26 mmol/L (ref 22–32)
TCO2: 26 mmol/L (ref 22–32)
TCO2: 25 mmol/L (ref 22–32)
TCO2: 24 mmol/L (ref 22–32)
TCO2: 24 mmol/L (ref 22–32)
pCO2 arterial: 59.1 mmHg — ABNORMAL HIGH (ref 32.0–48.0)
pCO2 arterial: 49.4 mmHg — ABNORMAL HIGH (ref 32.0–48.0)
pCO2 arterial: 49 mmHg — ABNORMAL HIGH (ref 32.0–48.0)
pCO2 arterial: 49.7 mmHg — ABNORMAL HIGH (ref 32.0–48.0)
pCO2 arterial: 42.4 mmHg (ref 32.0–48.0)
pCO2 arterial: 46.5 mmHg (ref 32.0–48.0)
pH, Arterial: 7.24 — ABNORMAL LOW (ref 7.350–7.450)
pH, Arterial: 7.297 — ABNORMAL LOW (ref 7.350–7.450)
pH, Arterial: 7.302 — ABNORMAL LOW (ref 7.350–7.450)
pH, Arterial: 7.276 — ABNORMAL LOW (ref 7.350–7.450)
pH, Arterial: 7.336 — ABNORMAL LOW (ref 7.350–7.450)
pH, Arterial: 7.288 — ABNORMAL LOW (ref 7.350–7.450)
pO2, Arterial: 240 mmHg — ABNORMAL HIGH (ref 83.0–108.0)
pO2, Arterial: 323 mmHg — ABNORMAL HIGH (ref 83.0–108.0)
pO2, Arterial: 311 mmHg — ABNORMAL HIGH (ref 83.0–108.0)
pO2, Arterial: 91 mmHg (ref 83.0–108.0)
pO2, Arterial: 80 mmHg — ABNORMAL LOW (ref 83.0–108.0)
pO2, Arterial: 122 mmHg — ABNORMAL HIGH (ref 83.0–108.0)

## 2019-08-09 LAB — CBC
HCT: 40.5 % (ref 39.0–52.0)
HCT: 40.2 % (ref 39.0–52.0)
Hemoglobin: 13.4 g/dL (ref 13.0–17.0)
Hemoglobin: 13.3 g/dL (ref 13.0–17.0)
MCH: 31.3 pg (ref 26.0–34.0)
MCH: 31.2 pg (ref 26.0–34.0)
MCHC: 33.1 g/dL (ref 30.0–36.0)
MCHC: 33.1 g/dL (ref 30.0–36.0)
MCV: 94.6 fL (ref 80.0–100.0)
MCV: 94.4 fL (ref 80.0–100.0)
Platelets: 122 10*3/uL — ABNORMAL LOW (ref 150–400)
Platelets: 116 10*3/uL — ABNORMAL LOW (ref 150–400)
RBC: 4.28 MIL/uL (ref 4.22–5.81)
RBC: 4.26 MIL/uL (ref 4.22–5.81)
RDW: 11.9 % (ref 11.5–15.5)
RDW: 11.9 % (ref 11.5–15.5)
WBC: 15.9 10*3/uL — ABNORMAL HIGH (ref 4.0–10.5)
WBC: 14.9 10*3/uL — ABNORMAL HIGH (ref 4.0–10.5)
nRBC: 0 % (ref 0.0–0.2)
nRBC: 0 % (ref 0.0–0.2)

## 2019-08-09 LAB — GLUCOSE, CAPILLARY
Glucose-Capillary: 116 mg/dL — ABNORMAL HIGH (ref 70–99)
Glucose-Capillary: 112 mg/dL — ABNORMAL HIGH (ref 70–99)
Glucose-Capillary: 126 mg/dL — ABNORMAL HIGH (ref 70–99)
Glucose-Capillary: 112 mg/dL — ABNORMAL HIGH (ref 70–99)
Glucose-Capillary: 128 mg/dL — ABNORMAL HIGH (ref 70–99)
Glucose-Capillary: 118 mg/dL — ABNORMAL HIGH (ref 70–99)
Glucose-Capillary: 112 mg/dL — ABNORMAL HIGH (ref 70–99)

## 2019-08-09 LAB — POCT I-STAT, CHEM 8
BUN: 17 mg/dL (ref 6–20)
BUN: 14 mg/dL (ref 6–20)
BUN: 15 mg/dL (ref 6–20)
BUN: 15 mg/dL (ref 6–20)
Calcium, Ion: 1.32 mmol/L (ref 1.15–1.40)
Calcium, Ion: 1.15 mmol/L (ref 1.15–1.40)
Calcium, Ion: 1.15 mmol/L (ref 1.15–1.40)
Calcium, Ion: 1.14 mmol/L — ABNORMAL LOW (ref 1.15–1.40)
Chloride: 106 mmol/L (ref 98–111)
Chloride: 104 mmol/L (ref 98–111)
Chloride: 108 mmol/L (ref 98–111)
Chloride: 107 mmol/L (ref 98–111)
Creatinine, Ser: 0.6 mg/dL — ABNORMAL LOW (ref 0.61–1.24)
Creatinine, Ser: 0.5 mg/dL — ABNORMAL LOW (ref 0.61–1.24)
Creatinine, Ser: 0.6 mg/dL — ABNORMAL LOW (ref 0.61–1.24)
Creatinine, Ser: 0.5 mg/dL — ABNORMAL LOW (ref 0.61–1.24)
Glucose, Bld: 103 mg/dL — ABNORMAL HIGH (ref 70–99)
Glucose, Bld: 99 mg/dL (ref 70–99)
Glucose, Bld: 121 mg/dL — ABNORMAL HIGH (ref 70–99)
Glucose, Bld: 115 mg/dL — ABNORMAL HIGH (ref 70–99)
HCT: 39 % (ref 39.0–52.0)
HCT: 33 % — ABNORMAL LOW (ref 39.0–52.0)
HCT: 32 % — ABNORMAL LOW (ref 39.0–52.0)
HCT: 33 % — ABNORMAL LOW (ref 39.0–52.0)
Hemoglobin: 13.3 g/dL (ref 13.0–17.0)
Hemoglobin: 11.2 g/dL — ABNORMAL LOW (ref 13.0–17.0)
Hemoglobin: 10.9 g/dL — ABNORMAL LOW (ref 13.0–17.0)
Hemoglobin: 11.2 g/dL — ABNORMAL LOW (ref 13.0–17.0)
Potassium: 4.4 mmol/L (ref 3.5–5.1)
Potassium: 4.2 mmol/L (ref 3.5–5.1)
Potassium: 5.5 mmol/L — ABNORMAL HIGH (ref 3.5–5.1)
Potassium: 5 mmol/L (ref 3.5–5.1)
Sodium: 139 mmol/L (ref 135–145)
Sodium: 141 mmol/L (ref 135–145)
Sodium: 137 mmol/L (ref 135–145)
Sodium: 140 mmol/L (ref 135–145)
TCO2: 26 mmol/L (ref 22–32)
TCO2: 27 mmol/L (ref 22–32)
TCO2: 27 mmol/L (ref 22–32)
TCO2: 26 mmol/L (ref 22–32)

## 2019-08-09 LAB — PROTIME-INR
INR: 1.2 (ref 0.8–1.2)
Prothrombin Time: 14.6 seconds (ref 11.4–15.2)

## 2019-08-09 LAB — APTT: aPTT: 32 seconds (ref 24–36)

## 2019-08-09 LAB — BASIC METABOLIC PANEL
Anion gap: 8 (ref 5–15)
BUN: 13 mg/dL (ref 6–20)
CO2: 21 mmol/L — ABNORMAL LOW (ref 22–32)
Calcium: 7.7 mg/dL — ABNORMAL LOW (ref 8.9–10.3)
Chloride: 109 mmol/L (ref 98–111)
Creatinine, Ser: 0.55 mg/dL — ABNORMAL LOW (ref 0.61–1.24)
GFR calc Af Amer: >60 mL/min (ref >60)
GFR calc non Af Amer: >60 mL/min (ref >60)
Glucose, Bld: 125 mg/dL — ABNORMAL HIGH (ref 70–99)
Potassium: 4.4 mmol/L (ref 3.5–5.1)
Sodium: 138 mmol/L (ref 135–145)

## 2019-08-09 LAB — PLATELET COUNT: Platelets: 134 10*3/uL — ABNORMAL LOW (ref 150–400)

## 2019-08-09 LAB — MAGNESIUM: Magnesium: 2.7 mg/dL — ABNORMAL HIGH (ref 1.7–2.4)

## 2019-08-09 LAB — HEMOGLOBIN AND HEMATOCRIT, BLOOD
HCT: 34.9 % — ABNORMAL LOW (ref 39.0–52.0)
Hemoglobin: 11.7 g/dL — ABNORMAL LOW (ref 13.0–17.0)

## 2019-08-09 SURGERY — REPLACEMENT, AORTIC VALVE, OPEN
Anesthesia: General | Site: Chest

## 2019-08-09 MED ORDER — VANCOMYCIN HCL IN DEXTROSE 1-5 GM/200ML-% IV SOLN
1000.0000 mg | Freq: Once | INTRAVENOUS | Status: AC
  Administered 2019-08-09: 19:00:00 1000 mg via INTRAVENOUS
  Filled 2019-08-09: qty 200

## 2019-08-09 MED ORDER — POTASSIUM CHLORIDE 10 MEQ/50ML IV SOLN: 10.0000 meq | INTRAVENOUS | Status: AC

## 2019-08-09 MED ORDER — CHLORHEXIDINE GLUCONATE 0.12 % MT SOLN
15.0000 mL | Freq: Once | OROMUCOSAL | Status: AC
  Administered 2019-08-09: 15 mL via OROMUCOSAL
  Filled 2019-08-09: qty 15

## 2019-08-09 MED ORDER — FENTANYL CITRATE (PF) 250 MCG/5ML IJ SOLN
INTRAMUSCULAR | Status: DC | PRN
  Administered 2019-08-09 (×3): 50 ug via INTRAVENOUS
  Administered 2019-08-09: 100 ug via INTRAVENOUS
  Administered 2019-08-09: 50 ug via INTRAVENOUS
  Administered 2019-08-09: 100 ug via INTRAVENOUS
  Administered 2019-08-09: 250 ug via INTRAVENOUS
  Administered 2019-08-09: 400 ug via INTRAVENOUS

## 2019-08-09 MED ORDER — NITROGLYCERIN IN D5W 200-5 MCG/ML-% IV SOLN: 0.0000 ug/min | INTRAVENOUS | Status: DC

## 2019-08-09 MED ORDER — FENTANYL CITRATE (PF) 100 MCG/2ML IJ SOLN
50.0000 ug | INTRAMUSCULAR | Status: DC | PRN
  Administered 2019-08-09 – 2019-08-11 (×19): 50 ug via INTRAVENOUS
  Filled 2019-08-09 (×19): qty 2

## 2019-08-09 MED ORDER — DEXTROSE 50 % IV SOLN: 0.0000 mL | INTRAVENOUS | Status: DC | PRN

## 2019-08-09 MED ORDER — PLASMA-LYTE 148 IV SOLN
INTRAVENOUS | Status: DC | PRN
  Administered 2019-08-09: 500 mL

## 2019-08-09 MED ORDER — MIDAZOLAM HCL 2 MG/2ML IJ SOLN: 2.0000 mg | INTRAMUSCULAR | Status: DC | PRN

## 2019-08-09 MED ORDER — PROPOFOL 10 MG/ML IV BOLUS: INTRAVENOUS | Status: DC | PRN

## 2019-08-09 MED ORDER — ASPIRIN 81 MG PO CHEW: 324.0000 mg | CHEWABLE_TABLET | Freq: Every day | ORAL | Status: DC

## 2019-08-09 MED ORDER — CHLORHEXIDINE GLUCONATE 4 % EX LIQD: 30.0000 mL | CUTANEOUS | Status: DC

## 2019-08-09 MED ORDER — FAMOTIDINE IN NACL 20-0.9 MG/50ML-% IV SOLN
20.0000 mg | Freq: Two times a day (BID) | INTRAVENOUS | Status: DC
  Administered 2019-08-09: 13:00:00 20 mg via INTRAVENOUS

## 2019-08-09 MED ORDER — LACTATED RINGERS IV SOLN: INTRAVENOUS | Status: DC

## 2019-08-09 MED ORDER — CHLORHEXIDINE GLUCONATE 0.12 % MT SOLN
OROMUCOSAL | Status: AC
  Administered 2019-08-09: 15 mL via OROMUCOSAL
  Filled 2019-08-09: qty 15

## 2019-08-09 MED ORDER — ALBUMIN HUMAN 5 % IV SOLN: INTRAVENOUS | Status: DC | PRN

## 2019-08-09 MED ORDER — ACETAMINOPHEN 160 MG/5ML PO SOLN: 1000.0000 mg | Freq: Four times a day (QID) | ORAL | Status: DC

## 2019-08-09 MED ORDER — VANCOMYCIN HCL 1000 MG IV SOLR
INTRAVENOUS | Status: DC | PRN
  Administered 2019-08-09: 1500 mg via INTRAVENOUS

## 2019-08-09 MED ORDER — MIDAZOLAM HCL (PF) 10 MG/2ML IJ SOLN
INTRAMUSCULAR | Status: AC
  Filled 2019-08-09: qty 2

## 2019-08-09 MED ORDER — HEPARIN SODIUM (PORCINE) 1000 UNIT/ML IJ SOLN
INTRAMUSCULAR | Status: DC | PRN
  Administered 2019-08-09: 27000 [IU] via INTRAVENOUS

## 2019-08-09 MED ORDER — PHENYLEPHRINE HCL-NACL 20-0.9 MG/250ML-% IV SOLN: 0.0000 ug/min | INTRAVENOUS | Status: DC

## 2019-08-09 MED ORDER — OXYCODONE HCL 5 MG PO TABS
5.0000 mg | ORAL_TABLET | ORAL | Status: DC | PRN
  Administered 2019-08-09: 18:00:00 5 mg via ORAL
  Administered 2019-08-09 – 2019-08-10 (×4): 10 mg via ORAL
  Administered 2019-08-10: 5 mg via ORAL
  Administered 2019-08-10: 03:00:00 10 mg via ORAL
  Administered 2019-08-11: 5 mg via ORAL
  Administered 2019-08-11 (×3): 10 mg via ORAL
  Filled 2019-08-09 (×2): qty 2
  Filled 2019-08-09: qty 1
  Filled 2019-08-09 (×2): qty 2
  Filled 2019-08-09 (×2): qty 1
  Filled 2019-08-09 (×4): qty 2

## 2019-08-09 MED ORDER — ROCURONIUM BROMIDE 10 MG/ML (PF) SYRINGE
PREFILLED_SYRINGE | INTRAVENOUS | Status: AC
  Filled 2019-08-09: qty 30

## 2019-08-09 MED ORDER — MAGNESIUM SULFATE 4 GM/100ML IV SOLN
4.0000 g | Freq: Once | INTRAVENOUS | Status: AC
  Administered 2019-08-09: 13:00:00 4 g via INTRAVENOUS

## 2019-08-09 MED ORDER — LACTATED RINGERS IV SOLN: INTRAVENOUS | Status: DC | PRN

## 2019-08-09 MED ORDER — CHLORHEXIDINE GLUCONATE 0.12 % MT SOLN
15.0000 mL | Freq: Two times a day (BID) | OROMUCOSAL | Status: DC
  Administered 2019-08-09 – 2019-08-13 (×7): 15 mL via OROMUCOSAL
  Filled 2019-08-09 (×7): qty 15

## 2019-08-09 MED ORDER — ASPIRIN EC 325 MG PO TBEC
325.0000 mg | DELAYED_RELEASE_TABLET | Freq: Every day | ORAL | Status: DC
  Administered 2019-08-10 – 2019-08-13 (×4): 325 mg via ORAL
  Filled 2019-08-09 (×4): qty 1

## 2019-08-09 MED ORDER — PANTOPRAZOLE SODIUM 40 MG PO TBEC
40.0000 mg | DELAYED_RELEASE_TABLET | Freq: Every day | ORAL | Status: DC
  Administered 2019-08-11 – 2019-08-13 (×3): 40 mg via ORAL
  Filled 2019-08-09 (×3): qty 1

## 2019-08-09 MED ORDER — CHLORHEXIDINE GLUCONATE 0.12 % MT SOLN
15.0000 mL | OROMUCOSAL | Status: AC
  Administered 2019-08-09: 14:00:00 15 mL via OROMUCOSAL
  Filled 2019-08-09: qty 15

## 2019-08-09 MED ORDER — PROTAMINE SULFATE 10 MG/ML IV SOLN
INTRAVENOUS | Status: AC
  Filled 2019-08-09: qty 25

## 2019-08-09 MED ORDER — ONDANSETRON HCL 4 MG/2ML IJ SOLN
4.0000 mg | Freq: Four times a day (QID) | INTRAMUSCULAR | Status: DC | PRN
  Administered 2019-08-10 – 2019-08-11 (×3): 4 mg via INTRAVENOUS
  Filled 2019-08-09 (×3): qty 2

## 2019-08-09 MED ORDER — SODIUM CHLORIDE 0.9 % IV SOLN: INTRAVENOUS | Status: DC

## 2019-08-09 MED ORDER — MIDAZOLAM HCL 5 MG/5ML IJ SOLN
INTRAMUSCULAR | Status: DC | PRN
  Administered 2019-08-09 (×2): 2 mg via INTRAVENOUS
  Administered 2019-08-09 (×2): 1 mg via INTRAVENOUS
  Administered 2019-08-09 (×2): 2 mg via INTRAVENOUS

## 2019-08-09 MED ORDER — SODIUM CHLORIDE 0.9 % IR SOLN
Status: DC | PRN
  Administered 2019-08-09: 3000 mL

## 2019-08-09 MED ORDER — PROPOFOL 10 MG/ML IV BOLUS
INTRAVENOUS | Status: AC
  Filled 2019-08-09: qty 20

## 2019-08-09 MED ORDER — HEMOSTATIC AGENTS (NO CHARGE) OPTIME
TOPICAL | Status: DC | PRN
  Administered 2019-08-09 (×3): 1 via TOPICAL

## 2019-08-09 MED ORDER — DEXMEDETOMIDINE HCL IN NACL 400 MCG/100ML IV SOLN
INTRAVENOUS | Status: DC | PRN
  Administered 2019-08-09: .7 ug/kg/h via INTRAVENOUS

## 2019-08-09 MED ORDER — SODIUM CHLORIDE 0.9 % IV SOLN: INTRAVENOUS | Status: AC

## 2019-08-09 MED ORDER — ALBUMIN HUMAN 5 % IV SOLN
250.0000 mL | INTRAVENOUS | Status: DC | PRN
  Administered 2019-08-09: 17:00:00 12.5 g via INTRAVENOUS

## 2019-08-09 MED ORDER — TRANEXAMIC ACID 1000 MG/10ML IV SOLN
INTRAVENOUS | Status: DC | PRN
  Administered 2019-08-09: 1.5 mg/kg/h via INTRAVENOUS

## 2019-08-09 MED ORDER — FENTANYL CITRATE (PF) 250 MCG/5ML IJ SOLN
INTRAMUSCULAR | Status: AC
  Filled 2019-08-09: qty 25

## 2019-08-09 MED ORDER — INSULIN REGULAR(HUMAN) IN NACL 100-0.9 UT/100ML-% IV SOLN: INTRAVENOUS | Status: DC

## 2019-08-09 MED ORDER — BISACODYL 5 MG PO TBEC
10.0000 mg | DELAYED_RELEASE_TABLET | Freq: Every day | ORAL | Status: DC
  Administered 2019-08-10 – 2019-08-12 (×3): 10 mg via ORAL
  Filled 2019-08-09 (×3): qty 2

## 2019-08-09 MED ORDER — CHLORHEXIDINE GLUCONATE CLOTH 2 % EX PADS
6.0000 | MEDICATED_PAD | Freq: Every day | CUTANEOUS | Status: DC
  Administered 2019-08-09 – 2019-08-12 (×4): 6 via TOPICAL

## 2019-08-09 MED ORDER — INSULIN REGULAR(HUMAN) IN NACL 100-0.9 UT/100ML-% IV SOLN
INTRAVENOUS | Status: DC | PRN
  Administered 2019-08-09: 1 [IU]/h via INTRAVENOUS

## 2019-08-09 MED ORDER — SODIUM CHLORIDE 0.9 % IV SOLN
1.5000 g | Freq: Two times a day (BID) | INTRAVENOUS | Status: AC
  Administered 2019-08-09 – 2019-08-11 (×4): 1.5 g via INTRAVENOUS
  Filled 2019-08-09 (×4): qty 1.5

## 2019-08-09 MED ORDER — PHENYLEPHRINE 40 MCG/ML (10ML) SYRINGE FOR IV PUSH (FOR BLOOD PRESSURE SUPPORT)
PREFILLED_SYRINGE | INTRAVENOUS | Status: AC
  Filled 2019-08-09: qty 30

## 2019-08-09 MED ORDER — SODIUM CHLORIDE 0.9 % IV SOLN: 250.0000 mL | INTRAVENOUS | Status: DC

## 2019-08-09 MED ORDER — ROCURONIUM BROMIDE 10 MG/ML (PF) SYRINGE
PREFILLED_SYRINGE | INTRAVENOUS | Status: DC | PRN
  Administered 2019-08-09: 50 mg via INTRAVENOUS
  Administered 2019-08-09: 60 mg via INTRAVENOUS
  Administered 2019-08-09: 40 mg via INTRAVENOUS
  Administered 2019-08-09 (×2): 50 mg via INTRAVENOUS
  Administered 2019-08-09: 30 mg via INTRAVENOUS

## 2019-08-09 MED ORDER — ACETAMINOPHEN 650 MG RE SUPP
650.0000 mg | Freq: Once | RECTAL | Status: AC
  Administered 2019-08-09: 650 mg via RECTAL

## 2019-08-09 MED ORDER — DEXMEDETOMIDINE HCL IN NACL 400 MCG/100ML IV SOLN: 0.0000 ug/kg/h | INTRAVENOUS | Status: DC

## 2019-08-09 MED ORDER — DOCUSATE SODIUM 100 MG PO CAPS
200.0000 mg | ORAL_CAPSULE | Freq: Every day | ORAL | Status: DC
  Administered 2019-08-10 – 2019-08-12 (×3): 200 mg via ORAL
  Filled 2019-08-09 (×3): qty 2

## 2019-08-09 MED ORDER — PROTAMINE SULFATE 10 MG/ML IV SOLN
INTRAVENOUS | Status: DC | PRN
  Administered 2019-08-09: 20 mg via INTRAVENOUS
  Administered 2019-08-09: 230 mg via INTRAVENOUS

## 2019-08-09 MED ORDER — SODIUM CHLORIDE 0.9% FLUSH
10.0000 mL | INTRAVENOUS | Status: DC | PRN
  Administered 2019-08-09: 10 mL

## 2019-08-09 MED ORDER — BISACODYL 10 MG RE SUPP: 10.0000 mg | Freq: Every day | RECTAL | Status: DC

## 2019-08-09 MED ORDER — ORAL CARE MOUTH RINSE
15.0000 mL | Freq: Two times a day (BID) | OROMUCOSAL | Status: DC
  Administered 2019-08-11: 18:00:00 15 mL via OROMUCOSAL

## 2019-08-09 MED ORDER — METOPROLOL TARTRATE 5 MG/5ML IV SOLN
2.5000 mg | INTRAVENOUS | Status: DC | PRN
  Administered 2019-08-10: 01:00:00 5 mg via INTRAVENOUS
  Filled 2019-08-09: qty 5

## 2019-08-09 MED ORDER — SODIUM CHLORIDE 0.9% FLUSH
3.0000 mL | Freq: Two times a day (BID) | INTRAVENOUS | Status: DC
  Administered 2019-08-10 – 2019-08-11 (×4): 3 mL via INTRAVENOUS

## 2019-08-09 MED ORDER — PHENYLEPHRINE HCL-NACL 20-0.9 MG/250ML-% IV SOLN
INTRAVENOUS | Status: DC | PRN
  Administered 2019-08-09: 25 ug/min via INTRAVENOUS

## 2019-08-09 MED ORDER — KETOROLAC TROMETHAMINE 15 MG/ML IJ SOLN
15.0000 mg | Freq: Four times a day (QID) | INTRAMUSCULAR | Status: AC
  Administered 2019-08-09 – 2019-08-10 (×5): 15 mg via INTRAVENOUS
  Filled 2019-08-09 (×5): qty 1

## 2019-08-09 MED ORDER — SODIUM CHLORIDE 0.9% FLUSH
3.0000 mL | INTRAVENOUS | Status: DC | PRN
  Administered 2019-08-12: 3 mL via INTRAVENOUS

## 2019-08-09 MED ORDER — SODIUM CHLORIDE 0.9% FLUSH
10.0000 mL | Freq: Two times a day (BID) | INTRAVENOUS | Status: DC
  Administered 2019-08-09 – 2019-08-11 (×6): 10 mL

## 2019-08-09 MED ORDER — METOPROLOL TARTRATE 12.5 MG HALF TABLET
12.5000 mg | ORAL_TABLET | Freq: Once | ORAL | Status: AC
  Administered 2019-08-09: 12.5 mg via ORAL
  Filled 2019-08-09: qty 1

## 2019-08-09 MED ORDER — ACETAMINOPHEN 500 MG PO TABS
1000.0000 mg | ORAL_TABLET | Freq: Four times a day (QID) | ORAL | Status: DC
  Administered 2019-08-09 – 2019-08-13 (×15): 1000 mg via ORAL
  Filled 2019-08-09 (×15): qty 2

## 2019-08-09 MED ORDER — FENTANYL CITRATE (PF) 100 MCG/2ML IJ SOLN
25.0000 ug | INTRAMUSCULAR | Status: DC | PRN
  Administered 2019-08-09: 15:00:00 25 ug via INTRAVENOUS
  Filled 2019-08-09: qty 2

## 2019-08-09 MED ORDER — LACTATED RINGERS IV SOLN: 500.0000 mL | Freq: Once | INTRAVENOUS | Status: DC | PRN

## 2019-08-09 MED ORDER — ATORVASTATIN CALCIUM 10 MG PO TABS: 10.0000 mg | ORAL_TABLET | Freq: Every evening | ORAL | Status: DC

## 2019-08-09 MED ORDER — SODIUM CHLORIDE 0.9 % IV SOLN
INTRAVENOUS | Status: DC | PRN
  Administered 2019-08-09: .75 g via INTRAVENOUS
  Administered 2019-08-09: 1.5 g via INTRAVENOUS

## 2019-08-09 MED ORDER — PHENYLEPHRINE 40 MCG/ML (10ML) SYRINGE FOR IV PUSH (FOR BLOOD PRESSURE SUPPORT)
PREFILLED_SYRINGE | INTRAVENOUS | Status: DC | PRN
  Administered 2019-08-09: 40 ug via INTRAVENOUS

## 2019-08-09 MED ORDER — PROPOFOL 10 MG/ML IV BOLUS
INTRAVENOUS | Status: DC | PRN
  Administered 2019-08-09: 90 mg via INTRAVENOUS
  Administered 2019-08-09: 20 mg via INTRAVENOUS

## 2019-08-09 MED ORDER — ACETAMINOPHEN 160 MG/5ML PO SOLN: 650.0000 mg | Freq: Once | ORAL | Status: AC

## 2019-08-09 MED ORDER — 0.9 % SODIUM CHLORIDE (POUR BTL) OPTIME
TOPICAL | Status: DC | PRN
  Administered 2019-08-09: 5000 mL

## 2019-08-09 MED ORDER — SODIUM CHLORIDE 0.45 % IV SOLN: INTRAVENOUS | Status: DC | PRN

## 2019-08-09 MED ORDER — ARTIFICIAL TEARS OPHTHALMIC OINT
TOPICAL_OINTMENT | OPHTHALMIC | Status: DC | PRN
  Administered 2019-08-09: 1 via OPHTHALMIC

## 2019-08-09 MED ORDER — INSULIN ASPART 100 UNIT/ML ~~LOC~~ SOLN
0.0000 [IU] | SUBCUTANEOUS | Status: DC
  Administered 2019-08-10: 04:00:00 2 [IU] via SUBCUTANEOUS

## 2019-08-09 MED ORDER — VANCOMYCIN HCL 1000 MG IV SOLR
INTRAVENOUS | Status: DC | PRN
  Administered 2019-08-09: 1000 mL

## 2019-08-09 SURGICAL SUPPLY — 131 items
ADAPTER CARDIO PERF ANTE/RETRO (ADAPTER) ×4 IMPLANT
ADH SKN CLS APL DERMABOND .7 (GAUZE/BANDAGES/DRESSINGS) ×1
ADPR PRFSN 84XANTGRD RTRGD (ADAPTER) ×4
BLADE CLIPPER SURG (BLADE) ×3 IMPLANT
BLADE STERNUM SYSTEM 6 (BLADE) ×4 IMPLANT
BLADE SURG 11 STRL SS (BLADE) ×4 IMPLANT
BLADE SURG 15 STRL LF DISP TIS (BLADE) IMPLANT
BLADE SURG 15 STRL SS (BLADE) ×3
CANISTER SUCT 3000ML PPV (MISCELLANEOUS) ×3 IMPLANT
CANNULA AORTIC ROOT 9FR (CANNULA) ×1 IMPLANT
CANNULA EZ GLIDE AORTIC 21FR (CANNULA) ×4 IMPLANT
CANNULA FEM VENOUS REMOTE 22FR (CANNULA) ×1 IMPLANT
CANNULA GUNDRY RCSP 15FR (MISCELLANEOUS) ×4 IMPLANT
CANNULA MC2 2 STG 36/46 NON-V (CANNULA) IMPLANT
CANNULA SUMP PERICARDIAL (CANNULA) ×1 IMPLANT
CANNULA VENNOUS METAL TIP 20FR (CANNULA) ×1 IMPLANT
CANNULA VENOUS 2 STG 34/46 (CANNULA) ×3
CATH CPB KIT OWEN (MISCELLANEOUS) ×3 IMPLANT
CATH HEART VENT LEFT (CATHETERS) ×2 IMPLANT
CATH ROBINSON RED A/P 18FR (CATHETERS) ×1 IMPLANT
CATH THORACIC 36FR (CATHETERS) ×1 IMPLANT
CATH THORACIC 36FR RT ANG (CATHETERS) ×3 IMPLANT
CNTNR URN SCR LID CUP LEK RST (MISCELLANEOUS) IMPLANT
CONN 1/2X1/2X1/2  BEN (MISCELLANEOUS) ×3
CONN 1/2X1/2X1/2 BEN (MISCELLANEOUS) IMPLANT
CONNECTOR 1/2X3/8X1/2 3 WAY (MISCELLANEOUS) ×3
CONNECTOR 1/2X3/8X1/2 3WAY (MISCELLANEOUS) IMPLANT
CONT SPEC 4OZ STRL OR WHT (MISCELLANEOUS) ×3
COVER PROBE W GEL 5X96 (DRAPES) ×1 IMPLANT
COVER SURGICAL LIGHT HANDLE (MISCELLANEOUS) ×3 IMPLANT
DEFOGGER ANTIFOG KIT (MISCELLANEOUS) ×1 IMPLANT
DERMABOND ADVANCED (GAUZE/BANDAGES/DRESSINGS) ×1
DERMABOND ADVANCED .7 DNX12 (GAUZE/BANDAGES/DRESSINGS) IMPLANT
DEVICE CLOSURE PERCLS PRGLD 6F (VASCULAR PRODUCTS) IMPLANT
DEVICE SUT CK QUICK LOAD INDV (Prosthesis & Implant Heart) ×2 IMPLANT
DEVICE SUT CK QUICK LOAD MINI (Prosthesis & Implant Heart) ×1 IMPLANT
DRAIN CHANNEL 32F RND 10.7 FF (WOUND CARE) ×4 IMPLANT
DRAPE CARDIOVASC SPLIT 88X140 (DRAPES) ×1 IMPLANT
DRAPE CARDIOVASCULAR INCISE (DRAPES)
DRAPE CV SPLIT W-CLR ANES SCRN (DRAPES) IMPLANT
DRAPE INCISE IOBAN 66X45 STRL (DRAPES) ×6 IMPLANT
DRAPE PERI GROIN 82X75IN TIB (DRAPES) IMPLANT
DRAPE SLUSH/WARMER DISC (DRAPES) ×3 IMPLANT
DRAPE SRG 135X102X78XABS (DRAPES) IMPLANT
DRSG AQUACEL AG ADV 3.5X14 (GAUZE/BANDAGES/DRESSINGS) ×4 IMPLANT
ELECT REM PT RETURN 9FT ADLT (ELECTROSURGICAL) ×6
ELECTRODE REM PT RTRN 9FT ADLT (ELECTROSURGICAL) ×4 IMPLANT
FELT TEFLON 1X6 (MISCELLANEOUS) ×8 IMPLANT
FIBERTAPE STERNAL CLSR 2 36IN (SUTURE) ×5 IMPLANT
FIBERTAPE STERNAL CLSR 2X36 (SUTURE) ×3 IMPLANT
GAUZE SPONGE 4X4 12PLY STRL (GAUZE/BANDAGES/DRESSINGS) ×7 IMPLANT
GAUZE SPONGE 4X4 12PLY STRL LF (GAUZE/BANDAGES/DRESSINGS) ×1 IMPLANT
GLOVE BIO SURGEON STRL SZ 6 (GLOVE) IMPLANT
GLOVE BIO SURGEON STRL SZ 6.5 (GLOVE) IMPLANT
GLOVE BIO SURGEON STRL SZ7 (GLOVE) IMPLANT
GLOVE BIO SURGEON STRL SZ7.5 (GLOVE) IMPLANT
GLOVE BIOGEL PI IND STRL 6.5 (GLOVE) IMPLANT
GLOVE BIOGEL PI IND STRL 7.5 (GLOVE) IMPLANT
GLOVE BIOGEL PI INDICATOR 6.5 (GLOVE) ×1
GLOVE BIOGEL PI INDICATOR 7.5 (GLOVE) ×3
GLOVE ORTHO TXT STRL SZ7.5 (GLOVE) ×10 IMPLANT
GLOVE SS BIOGEL STRL SZ 7 (GLOVE) IMPLANT
GLOVE SUPERSENSE BIOGEL SZ 7 (GLOVE) ×1
GLOVE SURG SS PI 6.0 STRL IVOR (GLOVE) ×1 IMPLANT
GOWN STRL REUS W/ TWL LRG LVL3 (GOWN DISPOSABLE) ×8 IMPLANT
GOWN STRL REUS W/TWL LRG LVL3 (GOWN DISPOSABLE) ×18
HEMOSTAT POWDER SURGIFOAM 1G (HEMOSTASIS) ×12 IMPLANT
INSERT FOGARTY XLG (MISCELLANEOUS) ×4 IMPLANT
KIT BASIN OR (CUSTOM PROCEDURE TRAY) ×3 IMPLANT
KIT DILATOR VASC 18G NDL (KITS) ×1 IMPLANT
KIT DRAINAGE VACCUM ASSIST (KITS) ×1 IMPLANT
KIT SUCTION CATH 14FR (SUCTIONS) ×10 IMPLANT
KIT SUT CK MINI COMBO 4X17 (Prosthesis & Implant Heart) ×1 IMPLANT
KIT TURNOVER KIT B (KITS) ×3 IMPLANT
LEAD PACING MYOCARDI (MISCELLANEOUS) ×4 IMPLANT
LINE VENT (MISCELLANEOUS) ×1 IMPLANT
LOOP VESSEL SUPERMAXI WHITE (MISCELLANEOUS) ×1 IMPLANT
NDL SUT PASSING CERCLAG MED (SUTURE) IMPLANT
NDL SUT PASSING CERCLAGE MED (SUTURE) ×3
NEEDLE SUT PASSING CERCLAG MED (SUTURE) ×2 IMPLANT
NS IRRIG 1000ML POUR BTL (IV SOLUTION) ×20 IMPLANT
PACK E OPEN HEART (SUTURE) ×3 IMPLANT
PACK OPEN HEART (CUSTOM PROCEDURE TRAY) ×3 IMPLANT
PAD ARMBOARD 7.5X6 YLW CONV (MISCELLANEOUS) ×6 IMPLANT
PERCLOSE PROGLIDE 6F (VASCULAR PRODUCTS) ×12
PLEDGET HAART (MISCELLANEOUS) ×1 IMPLANT
POSITIONER HEAD DONUT 9IN (MISCELLANEOUS) ×3 IMPLANT
SET CARDIOPLEGIA MPS 5001102 (MISCELLANEOUS) ×1 IMPLANT
SET IRRIG TUBING LAPAROSCOPIC (IRRIGATION / IRRIGATOR) ×3 IMPLANT
SET MICROPUNCTURE 5F STIFF (MISCELLANEOUS) ×1 IMPLANT
SHEATH PINNACLE 8F 10CM (SHEATH) ×2 IMPLANT
SUT BONE WAX W31G (SUTURE) ×3 IMPLANT
SUT ETHIBON 2 0 V 52N 30 (SUTURE) ×8 IMPLANT
SUT ETHIBON EXCEL 2-0 V-5 (SUTURE) IMPLANT
SUT ETHIBOND 2 0 SH (SUTURE) ×1 IMPLANT
SUT ETHIBOND 2 0 SH 36X2 (SUTURE) IMPLANT
SUT ETHIBOND 2 0 V4 (SUTURE) IMPLANT
SUT ETHIBOND 2 0V4 GREEN (SUTURE) IMPLANT
SUT ETHIBOND 4 0 RB 1 (SUTURE) ×1 IMPLANT
SUT ETHIBOND V-5 VALVE (SUTURE) ×2 IMPLANT
SUT ETHIBOND X763 2 0 SH 1 (SUTURE) ×13 IMPLANT
SUT MNCRL AB 3-0 PS2 18 (SUTURE) ×6 IMPLANT
SUT PDS AB 1 CTX 36 (SUTURE) ×8 IMPLANT
SUT PROLENE 3 0 SH DA (SUTURE) ×4 IMPLANT
SUT PROLENE 3 0 SH1 36 (SUTURE) ×6 IMPLANT
SUT PROLENE 4 0 RB 1 (SUTURE) ×129
SUT PROLENE 4 0 SH DA (SUTURE) ×4 IMPLANT
SUT PROLENE 4-0 RB1 .5 CRCL 36 (SUTURE) ×34 IMPLANT
SUT PROLENE 4-0 RB1 18X2 ARM (SUTURE) IMPLANT
SUT PROLENE 5 0 C 1 36 (SUTURE) IMPLANT
SUT PROLENE 6 0 C 1 30 (SUTURE) IMPLANT
SUT PROLENE POLY MONO (SUTURE) ×1 IMPLANT
SUT SILK  1 MH (SUTURE) ×6
SUT SILK 1 MH (SUTURE) ×2 IMPLANT
SUT SILK 2 0 SH CR/8 (SUTURE) ×1 IMPLANT
SUT SILK 3 0 SH CR/8 (SUTURE) IMPLANT
SUT STEEL 6MS V (SUTURE) IMPLANT
SUT VIC AB 2-0 CTX 27 (SUTURE) IMPLANT
SYSTEM SAHARA CHEST DRAIN ATS (WOUND CARE) ×3 IMPLANT
TAPE CLOTH SURG 4X10 WHT LF (GAUZE/BANDAGES/DRESSINGS) ×1 IMPLANT
TAPE PAPER 2X10 WHT MICROPORE (GAUZE/BANDAGES/DRESSINGS) ×1 IMPLANT
TOWEL GREEN STERILE (TOWEL DISPOSABLE) ×3 IMPLANT
TOWEL GREEN STERILE FF (TOWEL DISPOSABLE) ×2 IMPLANT
TRAY FOLEY SLVR 16FR TEMP STAT (SET/KITS/TRAYS/PACK) ×3 IMPLANT
TUBE SUCT INTRACARD DLP 20F (MISCELLANEOUS) ×1 IMPLANT
TUBING MEDICAL 3X8X3X32 (MISCELLANEOUS) ×1 IMPLANT
UNDERPAD 30X30 (UNDERPADS AND DIAPERS) ×3 IMPLANT
VALVE AORTIC SZ29 INSP/RESIL (Valve) ×1 IMPLANT
VENT LEFT HEART 12002 (CATHETERS) ×6
WATER STERILE IRR 1000ML POUR (IV SOLUTION) ×6 IMPLANT
WIRE EMERALD 3MM-J .035X150CM (WIRE) ×1 IMPLANT

## 2019-08-09 NOTE — Anesthesia Procedure Notes (Signed)
Procedure Name: Intubation Date/Time: 08/09/2019 7:57 AM Performed by: Bryson Corona, CRNA Pre-anesthesia Checklist: Patient identified, Emergency Drugs available, Suction available and Patient being monitored Patient Re-evaluated:Patient Re-evaluated prior to induction Oxygen Delivery Method: Circle System Utilized Preoxygenation: Pre-oxygenation with 100% oxygen Induction Type: IV induction Ventilation: Oral airway inserted - appropriate to patient size Laryngoscope Size: Mac and 4 Grade View: Grade I Tube type: Oral Number of attempts: 1 Airway Equipment and Method: Stylet and Oral airway Placement Confirmation: ETT inserted through vocal cords under direct vision,  positive ETCO2 and breath sounds checked- equal and bilateral Secured at: 22 cm Tube secured with: Tape Dental Injury: Teeth and Oropharynx as per pre-operative assessment

## 2019-08-09 NOTE — Progress Notes (Signed)
      [address]       [place] [address]             [phone]      S/p AVR, closure of PFO  Extubated  BP 105/87   Pulse 85   Temp 98.1 F (36.7 C)   Resp 11   Ht 6\' 2"  (1.88 m)   Wt 88.3 kg   SpO2 100%   BMI 24.99 kg/m  4L Dean 100% sat 23/11 CI= 2.2   Intake/Output Summary (Last 24 hours) at 08/09/2019 1740 Last data filed at 08/09/2019 1530 Gross per 24 hour  Intake 3616.43 ml  Output 3371 ml  Net 245.43 ml   K= 5.2, Hct= 42 Minimal CT output  Emalie Mcwethy C. Roxan Hockey, MD [facility] [phone]

## 2019-08-09 NOTE — Transfer of Care (Signed)
Immediate Anesthesia Transfer of Care Note  Patient: Donald [name]  Procedure(s) Performed: AORTIC VALVE REPLACEMENT (AVR) using Edwards INSPIRIS Resilia 29 MM Aortic Valve. (N/A Chest) TRANSESOPHAGEAL ECHOCARDIOGRAM (TEE) (N/A ) Closure Of Patent Foramen Ovale (N/A Chest)  Patient Location: ICU  Anesthesia Type:General  Level of Consciousness: Patient remains intubated per anesthesia plan  Airway & Oxygen Therapy: Patient remains intubated per anesthesia plan and Patient placed on Ventilator (see vital sign flow sheet for setting)  Post-op Assessment: Report given to RN and Post -op Vital signs reviewed and stable  Post vital signs: Reviewed and stable  Last Vitals:  Vitals Value Taken Time  BP 103/66   Temp    Pulse 85   Resp 16   SpO2 98     Last Pain:  Vitals:   08/09/19 0607  TempSrc:   PainSc: 0-No pain         Complications: No apparent anesthesia complications

## 2019-08-09 NOTE — Brief Op Note (Signed)
08/09/2019  11:04 AM  PATIENT:  Donald [name]  54 y.o. male  PRE-OPERATIVE DIAGNOSIS:  Aortic Insufficiency  POST-OPERATIVE DIAGNOSIS:  * No post-op diagnosis entered *  PROCEDURE:  Procedure(s): AORTIC VALVE REPLACEMENT (AVR) using Edwards INSPIRIS Resilia 29 MM Aortic Valve. (N/A) TRANSESOPHAGEAL ECHOCARDIOGRAM (TEE) (N/A) Closure Of Patent Foramen Ovale (N/A)  SURGEON:  Surgeon(s) and Role:    * Rexene Alberts, MD - Primary    * Bartle, Fernande Boyden, MD - Assisting  PHYSICIAN ASSISTANT:   Nicholes Rough, PA-C   ANESTHESIA:   general  EBL:  363   BLOOD ADMINISTERED:none  DRAINS: routine   LOCAL MEDICATIONS USED:  NONE  SPECIMEN:  Source of Specimen:  aortic valve leaflets  DISPOSITION OF SPECIMEN:  PATHOLOGY  COUNTS:  YES  DICTATION: .Dragon Dictation  PLAN OF CARE: Admit to inpatient   PATIENT DISPOSITION:  ICU - intubated and hemodynamically stable.   Delay start of Pharmacological VTE agent (>24hrs) due to surgical blood loss or risk of bleeding: yes

## 2019-08-09 NOTE — Anesthesia Procedure Notes (Signed)
Arterial Line Insertion Start/End4/15/2021 6:45 AM, 08/09/2019 6:50 AM Performed by: Bryson Corona, CRNA, CRNA  Patient location: Pre-op. Preanesthetic checklist: patient identified, IV checked, site marked, risks and benefits discussed, surgical consent, monitors and equipment checked, pre-op evaluation, timeout performed and anesthesia consent Lidocaine 1% used for infiltration Left, radial was placed Catheter size: 20 Fr Hand hygiene performed  and maximum sterile barriers used   Attempts: 1 Procedure performed without using ultrasound guided technique. Following insertion, dressing applied and Biopatch. Post procedure assessment: normal and unchanged  Patient tolerated the procedure well with no immediate complications.

## 2019-08-09 NOTE — Anesthesia Preprocedure Evaluation (Addendum)
Anesthesia Evaluation  Patient identified by MRN, date of birth, ID band Patient awake    Reviewed: Allergy & Precautions, H&P , NPO status , Patient's Chart, lab work & pertinent test results  Airway Mallampati: II   Neck ROM: full    Dental   Pulmonary Current Smoker and Patient abstained from smoking.,    breath sounds clear to auscultation       Cardiovascular hypertension, + Valvular Problems/Murmurs AI  Rhythm:regular Rate:Normal     Neuro/Psych    GI/Hepatic negative GI ROS,   Endo/Other    Renal/GU      Musculoskeletal   Abdominal   Peds  Hematology   Anesthesia Other Findings   Reproductive/Obstetrics                            Anesthesia Physical Anesthesia Plan  ASA: III  Anesthesia Plan: General   Post-op Pain Management:    Induction: Intravenous  PONV Risk Score and Plan: 1 and Ondansetron, Dexamethasone, Midazolam and Treatment may vary due to age or medical condition  Airway Management Planned: Oral ETT  Additional Equipment: Arterial line, CVP, PA Cath, TEE and Ultrasound Guidance Line Placement  Intra-op Plan:   Post-operative Plan: Extubation in OR  Informed Consent: I have reviewed the patients History and Physical, chart, labs and discussed the procedure including the risks, benefits and alternatives for the proposed anesthesia with the patient or authorized representative who has indicated his/her understanding and acceptance.       Plan Discussed with: CRNA, Anesthesiologist and Surgeon  Anesthesia Plan Comments:         Anesthesia Quick Evaluation

## 2019-08-09 NOTE — Op Note (Addendum)
CARDIOTHORACIC SURGERY OPERATIVE NOTE  Date of Procedure:  08/09/2019  Preoperative Diagnosis:   Severe Aortic Insufficiency  Patent Foramen Ovale  Postoperative Diagnosis: Same   Procedure:    Aortic Valve Replacement  Edwards Inspiris Resilia stented bovine pericardial tissue valve (size 29 mm, ref # [id number], serial # [id number])  Closure of Patent Foramen Ovale   Surgeon: Valentina Gu. Roxy Manns, MD  Assistant: Gaye Pollack, MD and Nicholes Rough, MD  Anesthesia: Albertha Ghee, MD  Operative Findings:  Leaflet prolapse of left and non-coronary leaflets  Extensive fenestrations in all 3 leaflets  Type II dysfunction with severe aortic insufficiency  Moderate-severe left ventricular systolic dysfunction              BRIEF CLINICAL NOTE AND INDICATIONS FOR SURGERY  Patient is a 54 year old Donald [name] with no previous cardiac history who has been referred for surgical consultation to discuss recently diagnosed severe aortic insufficiency.  Patient states that he was told when he was younger there is a possible history of rheumatic fever. He has not previously been told that he had a heart murmur.He recently changed primary care physicians and was found to have a abnormal EKG which prompted referral for a formal cardiology consultation. The patient was seen by Dr. [name] on June 28, 2019 and EKG revealed sinus rhythm with occasional PVC and right bundle branch block. Transthoracic echocardiogram was performed June 28, 2019 and revealed moderate global left ventricular systolic dysfunction with ejection fraction estimated only 30 to 35%. There was moderate to severe left ventricular chamber enlargement with end-diastolic left ventricular internal diameter reported 6.9 cm. There was prolapse of what was reported to be the noncoronary cusp of the aortic valve with at least moderate to severe aortic insufficiency.Aortic valve pressure half-time was reported 234 ms. TEE was  performed July 05, 2019 and confirmed the presence of a trileaflet aortic valve with severe aortic insufficiency with isolated prolapse involving the left coronary cusp. Left ventricular systolic function was moderately reduced with ejection fraction estimated 30 to 35%. There was left ventricular chamber enlargement although neither end-systolic nor end-diastolic chamber dimensions were reported. There was a small patent foramen ovale. Cardiothoracic surgical consultation was requested.   DETAILS OF THE OPERATIVE PROCEDURE  Preparation:  The patient is brought to the operating room on the above mentioned date and central monitoring was established by the anesthesia team including placement of Swan-Ganz catheter and radial arterial line. The patient is placed in the supine position on the operating table.  Intravenous antibiotics are administered. General endotracheal anesthesia is induced uneventfully. A Foley catheter is placed.  Baseline transesophageal echocardiogram was performed.  Findings were notable for severe aortic insufficiency with moderate to severe left ventricular systolic dysfunction and severe left ventricular chamber enlargement.  The aortic valve was trileaflet.  There was severe prolapse involving the noncoronary leaflet with an eccentric jet of aortic insufficiency.  The left ventricle was dilated with left ventricular end-diastolic diameter measured greater than 7.0 cm.  There was a moderate sized patent foramen ovale.  The patient's chest, abdomen, both groins, and both lower extremities are prepared and draped in a sterile manner. A time out procedure is performed.   Percutaneous Vascular Access:  Percutaneous venous access were obtained on the right side.  Using ultrasound guidance the right common femoral vein was cannulated using the Seldinger technique and a pair of Perclose vascular closure devises were placed at opposing 30 degree angles, after which time an 8  French sheath inserted.  Surgical Approach:  A median sternotomy incision was performed and the pericardium is opened. The ascending aorta is normal in appearance.    Extracorporeal Cardiopulmonary Bypass and Myocardial Protection:  The patient was heparinized systemically.  The right common femoral vein is cannulated through the venous sheath and a guidewire advanced into the right atrium using TEE guidance.  The femoral vein cannulated using a 22 Fr long femoral venous cannula.  The ascending aorta is cannulated for cardiopulmonary bypass.  Adequate heparinization is verified.   A retrograde cardioplegia cannula is placed through the right atrium into the coronary sinus.  The operative field was continuously flooded with carbon dioxide gas.  The entire pre-bypass portion of the operation was notable for stable hemodynamics.  Cardiopulmonary bypass was begun and the surface of the heart is inspected.  A second venous cannula is placed directly into the superior vena cava.  A left ventricular vent is placed through the right superior pulmonary vein.  A cardioplegia cannula is placed in the ascending aorta.  A temperature probe was placed in the interventricular septum.  The patient is cooled to 32C systemic temperature.  The aortic cross clamp is applied and cardioplegia is delivered initially in an antegrade fashion through the aortic root using modified [place] cold blood cardioplegia ([place] blood cardioplegia protocol).   The majority of the arresting doses administered retrograde through the coronary sinus catheter once the patient developed ventricular fibrillation.   Myocardial protection was felt to be excellent.   Aortic Valve Replacement:  A low transverse aortotomy incision was performed.  The aortic valve is inspected.  4-0 Prolene traction sutures are utilized to suspend the valve symmetrically.  The aortic valve is trileaflet.  There is severe prolapse involving the  noncoronary leaflet and prolapse of the left coronary leaflet as well.  There is extensive fenestrations involving all 3 leaflets with 2 very large fenestrations in the noncoronary leaflet.  Leaflet tissue is diminutive.  After examining the valve a decision is made not to attempt valve repair and proceed directly to valve replacement.  The aortic valve leaflets were excised sharply.  The aortic annulus was sized to accept a 29 mm prosthesis.  The aortic root and left ventricle were irrigated with copious cold saline solution.  Aortic valve replacement was performed using interrupted horizontal mattress 2-0 Ethibond pledgeted sutures with pledgets in the subannular position.  An Edwards Inspiris Resilia stented bovine pericardial tissue valve (size 29 mm, ref # [id number], serial # [id number]) was implanted uneventfully. All sutures were secured using a Cor-knot device.  The valve seated appropriately with adequate space beneath the left main and right coronary artery.  The aortotomy was closed using a 2-layer closure of running 4-0 Prolene suture.     Closure of Patent Foramen Ovale:  Umbilical tapes were placed around the superior vena cava and the inferior vena cava.  The femoral venous cannula is pulled down until the tip of the cannula is just outside the right atrium.  An oblique right atriotomy incision is performed.  Umbilical tapes were snared.  The fossa ovalis is inspected.  There is a large patent foramen ovale.  The patent foramen ovale is closed with 2 layer closure of running 4-0 Prolene suture..    Procedure Completion:  One final dose of warm retrograde "reanimation dose" cardioplegia was administered retrograde through the coronary sinus catheter while all air was evacuated through the aortic root.  The aortic cross clamp was removed after a total cross clamp time  of 87 minutes.  The right atriotomy incision is closed.  Epicardial pacing wires are fixed to the right ventricular outflow  tract and to the right atrial appendage. The patient is rewarmed to 37C temperature. The aortic and left ventricular vents are removed.  The superior vena cava cannula was removed.  The patient is weaned and disconnected from cardiopulmonary bypass.  The patient's rhythm at separation from bypass was sinus.  The patient was weaned from cardiopulmonary bypass without any inotropic support. Total cardiopulmonary bypass time for the operation was 118 minutes.  Followup transesophageal echocardiogram performed after separation from bypass revealed a well-seated aortic valve prosthesis that was functioning normally and without any sign of paravalvular leak.  Left ventricular function was unchanged from preoperatively.  The aortic and femoral venous cannula were removed uneventfully.  Perclose sutures are secured.  Protamine was administered to reverse the anticoagulation. The mediastinum and pleural space were inspected for hemostasis and irrigated with saline solution. The mediastinum was drained using 2 chest tubes placed through separate stab incisions inferiorly.  The soft tissues anterior to the aorta were reapproximated loosely. The sternum is closed with fibertape cerclage. The soft tissues anterior to the sternum were closed in multiple layers and the skin is closed with a running subcuticular skin closure.  The post-bypass portion of the operation was notable for stable rhythm and hemodynamics.  No blood products were administered during the operation.   Disposition:  The patient tolerated the procedure well and is transported to the surgical intensive care in stable condition. There are no intraoperative complications. All sponge instrument and needle counts are verified correct at completion of the operation.    Valentina Gu. Roxy Manns MD 08/09/2019 12:29 PM

## 2019-08-09 NOTE — Interval H&P Note (Signed)
History and Physical Interval Note:  08/09/2019 5:30 AM  Donald [name]  has presented today for surgery, with the diagnosis of Aortic Insufficiency.  The various methods of treatment have been discussed with the patient and family. After consideration of risks, benefits and other options for treatment, the patient has consented to  Procedure(s): AORTIC VALVE REPAIR or Replacement, TEE (N/A) AORTIC VALVE REPLACEMENT (AVR) (N/A) TRANSESOPHAGEAL ECHOCARDIOGRAM (TEE) (N/A) as a surgical intervention.  The patient's history has been reviewed, patient examined, no change in status, stable for surgery.  I have reviewed the patient's chart and labs.  Questions were answered to the patient's satisfaction.     Rexene Alberts

## 2019-08-09 NOTE — Anesthesia Procedure Notes (Signed)
Central Venous Catheter Insertion Performed by: Albertha Ghee, MD, anesthesiologist Start/End4/15/2021 6:47 AM, 08/09/2019 6:57 AM Patient location: Pre-op. Preanesthetic checklist: patient identified, IV checked, site marked, risks and benefits discussed, surgical consent, monitors and equipment checked, pre-op evaluation, timeout performed and anesthesia consent Position: Trendelenburg Lidocaine 1% used for infiltration and patient sedated Hand hygiene performed , maximum sterile barriers used  and Seldinger technique used Catheter size: 9 Fr Central line and PA cath was placed.MAC introducer Swan type:thermodilation Procedure performed using ultrasound guided technique. Ultrasound Notes:anatomy identified, needle tip was noted to be adjacent to the nerve/plexus identified, no ultrasound evidence of intravascular and/or intraneural injection and image(s) printed for medical record Attempts: 1 Following insertion, line sutured, dressing applied and Biopatch. Post procedure assessment: blood return through all ports, free fluid flow and no air  Patient tolerated the procedure well with no immediate complications.

## 2019-08-09 NOTE — Procedures (Signed)
Extubation Procedure Note  Patient Details:   Name: Donald [name] DOB: [birth year] MRN: [id number]   Airway Documentation:    Vent end date: 08/09/19 Vent end time: 1445   Evaluation  O2 sats: stable throughout Complications: No apparent complications Patient did tolerate procedure well. Bilateral Breath Sounds: Clear, Diminished   Yes  VC 1.0 L and NIF -30. Patient had positive cuff leak prior to extubation. Patient was extubated to 4 LPM nasal cannula. Strong cough. No stridor noted. BBS clear; diminished. RN at bedside. RT will continue to monitor.   Jastin Fore M 08/09/2019, 2:51 PM

## 2019-08-09 NOTE — Progress Notes (Signed)
Echocardiogram Echocardiogram Transesophageal has been performed.  Burgundy Matuszak M 08/09/2019, 8:35 AM 

## 2019-08-10 ENCOUNTER — Other Ambulatory Visit: Payer: Self-pay

## 2019-08-10 ENCOUNTER — Inpatient Hospital Stay (HOSPITAL_COMMUNITY): Payer: BC Managed Care – PPO

## 2019-08-10 LAB — BASIC METABOLIC PANEL
Anion gap: 8 (ref 5–15)
Anion gap: 6 (ref 5–15)
BUN: 15 mg/dL (ref 6–20)
BUN: 13 mg/dL (ref 6–20)
CO2: 21 mmol/L — ABNORMAL LOW (ref 22–32)
CO2: 26 mmol/L (ref 22–32)
Calcium: 7.8 mg/dL — ABNORMAL LOW (ref 8.9–10.3)
Calcium: 8.1 mg/dL — ABNORMAL LOW (ref 8.9–10.3)
Chloride: 107 mmol/L (ref 98–111)
Chloride: 100 mmol/L (ref 98–111)
Creatinine, Ser: 0.57 mg/dL — ABNORMAL LOW (ref 0.61–1.24)
Creatinine, Ser: 0.59 mg/dL — ABNORMAL LOW (ref 0.61–1.24)
GFR calc Af Amer: >60 mL/min (ref >60)
GFR calc Af Amer: >60 mL/min (ref >60)
GFR calc non Af Amer: >60 mL/min (ref >60)
GFR calc non Af Amer: >60 mL/min (ref >60)
Glucose, Bld: 125 mg/dL — ABNORMAL HIGH (ref 70–99)
Glucose, Bld: 117 mg/dL — ABNORMAL HIGH (ref 70–99)
Potassium: 4.3 mmol/L (ref 3.5–5.1)
Potassium: 4.2 mmol/L (ref 3.5–5.1)
Sodium: 136 mmol/L (ref 135–145)
Sodium: 132 mmol/L — ABNORMAL LOW (ref 135–145)

## 2019-08-10 LAB — GLUCOSE, CAPILLARY
Glucose-Capillary: 110 mg/dL — ABNORMAL HIGH (ref 70–99)
Glucose-Capillary: 114 mg/dL — ABNORMAL HIGH (ref 70–99)
Glucose-Capillary: 114 mg/dL — ABNORMAL HIGH (ref 70–99)
Glucose-Capillary: 117 mg/dL — ABNORMAL HIGH (ref 70–99)
Glucose-Capillary: 118 mg/dL — ABNORMAL HIGH (ref 70–99)
Glucose-Capillary: 125 mg/dL — ABNORMAL HIGH (ref 70–99)

## 2019-08-10 LAB — CBC
HCT: 37.1 % — ABNORMAL LOW (ref 39.0–52.0)
HCT: 36.2 % — ABNORMAL LOW (ref 39.0–52.0)
Hemoglobin: 12.2 g/dL — ABNORMAL LOW (ref 13.0–17.0)
Hemoglobin: 11.9 g/dL — ABNORMAL LOW (ref 13.0–17.0)
MCH: 31.1 pg (ref 26.0–34.0)
MCH: 31.6 pg (ref 26.0–34.0)
MCHC: 32.9 g/dL (ref 30.0–36.0)
MCHC: 32.9 g/dL (ref 30.0–36.0)
MCV: 94.6 fL (ref 80.0–100.0)
MCV: 96.3 fL (ref 80.0–100.0)
Platelets: 118 10*3/uL — ABNORMAL LOW (ref 150–400)
Platelets: 114 10*3/uL — ABNORMAL LOW (ref 150–400)
RBC: 3.92 MIL/uL — ABNORMAL LOW (ref 4.22–5.81)
RBC: 3.76 MIL/uL — ABNORMAL LOW (ref 4.22–5.81)
RDW: 12.1 % (ref 11.5–15.5)
RDW: 12.1 % (ref 11.5–15.5)
WBC: 12.4 10*3/uL — ABNORMAL HIGH (ref 4.0–10.5)
WBC: 11.6 10*3/uL — ABNORMAL HIGH (ref 4.0–10.5)
nRBC: 0 % (ref 0.0–0.2)
nRBC: 0 % (ref 0.0–0.2)

## 2019-08-10 LAB — MAGNESIUM
Magnesium: 2.1 mg/dL (ref 1.7–2.4)
Magnesium: 2.2 mg/dL (ref 1.7–2.4)

## 2019-08-10 LAB — SURGICAL PATHOLOGY

## 2019-08-10 MED ORDER — ENOXAPARIN SODIUM 40 MG/0.4ML ~~LOC~~ SOLN: 40.0000 mg | Freq: Every day | SUBCUTANEOUS | Status: DC

## 2019-08-10 MED ORDER — POTASSIUM CHLORIDE CRYS ER 20 MEQ PO TBCR
20.0000 meq | EXTENDED_RELEASE_TABLET | Freq: Every day | ORAL | Status: AC
  Administered 2019-08-11 – 2019-08-13 (×3): 20 meq via ORAL
  Filled 2019-08-10 (×3): qty 1

## 2019-08-10 MED ORDER — ENOXAPARIN SODIUM 40 MG/0.4ML ~~LOC~~ SOLN
40.0000 mg | Freq: Every day | SUBCUTANEOUS | Status: DC
  Administered 2019-08-11 – 2019-08-12 (×2): 40 mg via SUBCUTANEOUS
  Filled 2019-08-10 (×2): qty 0.4

## 2019-08-10 MED ORDER — FUROSEMIDE 40 MG PO TABS
40.0000 mg | ORAL_TABLET | Freq: Every day | ORAL | Status: AC
  Administered 2019-08-11 – 2019-08-12 (×2): 40 mg via ORAL
  Filled 2019-08-10 (×2): qty 1

## 2019-08-10 MED ORDER — METOPROLOL TARTRATE 12.5 MG HALF TABLET
12.5000 mg | ORAL_TABLET | Freq: Two times a day (BID) | ORAL | Status: DC
  Administered 2019-08-10 – 2019-08-11 (×4): 12.5 mg via ORAL
  Filled 2019-08-10 (×5): qty 1

## 2019-08-10 MED ORDER — ATORVASTATIN CALCIUM 10 MG PO TABS
10.0000 mg | ORAL_TABLET | Freq: Every evening | ORAL | Status: DC
  Administered 2019-08-12: 10 mg via ORAL
  Filled 2019-08-10: qty 1

## 2019-08-10 MED ORDER — INSULIN ASPART 100 UNIT/ML ~~LOC~~ SOLN: 0.0000 [IU] | SUBCUTANEOUS | Status: DC

## 2019-08-10 MED ORDER — POTASSIUM CHLORIDE CRYS ER 20 MEQ PO TBCR: 20.0000 meq | EXTENDED_RELEASE_TABLET | Freq: Every day | ORAL | Status: DC

## 2019-08-10 MED ORDER — FUROSEMIDE 10 MG/ML IJ SOLN
20.0000 mg | Freq: Two times a day (BID) | INTRAMUSCULAR | Status: AC
  Administered 2019-08-10 (×2): 20 mg via INTRAVENOUS
  Filled 2019-08-10 (×2): qty 2

## 2019-08-10 MED ORDER — PROMETHAZINE HCL 25 MG/ML IJ SOLN
12.5000 mg | Freq: Four times a day (QID) | INTRAMUSCULAR | Status: DC | PRN
  Administered 2019-08-10: 20:00:00 12.5 mg via INTRAVENOUS
  Filled 2019-08-10: qty 1

## 2019-08-10 NOTE — Discharge Instructions (Signed)

## 2019-08-10 NOTE — Progress Notes (Addendum)
[facility] DAILY ICU PROGRESS NOTE                   [address]            [place] [employer],[place] [address]          [phone]   1 Day Post-Op Procedure(s) (LRB): AORTIC VALVE REPLACEMENT (AVR) using Edwards INSPIRIS Resilia 29 MM Aortic Valve. (N/A) TRANSESOPHAGEAL ECHOCARDIOGRAM (TEE) (N/A) Closure Of Patent Foramen Ovale (N/A)  Total Length of Stay:  LOS: 1 day   Subjective:  Patient with pain this morning.  Denies N/V.   Breathing okay, but states he can't take a good deep breath due to discomfort.  Objective: Vital signs in last 24 hours: Temp:  [97.3 F (36.3 C)-98.2 F (36.8 C)] 97.3 F (36.3 C) (04/16 0700) Pulse Rate:  [85-87] 85 (04/15 1450) Cardiac Rhythm: Normal sinus rhythm (04/16 0400) Resp:  [10-24] 14 (04/16 0700) BP: (93-136)/(66-96) 105/66 (04/16 0700) SpO2:  [95 %-100 %] 96 % (04/16 0700) Arterial Line BP: (88-167)/(54-106) 128/62 (04/16 0700) FiO2 (%):  [40 %-50 %] 40 % (04/15 1345) Weight:  [90.6 kg] 90.6 kg (04/16 0500)  Filed Weights   08/09/19 0608 08/10/19 0500  Weight: 88.3 kg 90.6 kg    Weight change: 2.33 kg   Hemodynamic parameters for last 24 hours: PAP: (12-64)/(6-51) 22/10 CO:  [3.8 L/min-8.5 L/min] 3.8 L/min CI:  [2.1 L/min/m2-3.9 L/min/m2] 3.2 L/min/m2  Intake/Output from previous day: 04/15 0701 - 04/16 0700 In: 6463.5 [P.O.:600; I.V.:4321.9; Blood:675; IV [id number] Out: [id number] [Urine:3600; Blood:1038; Chest Tube:360]  Current Meds: Scheduled Meds: . acetaminophen  1,000 mg Oral Q6H   Or  . acetaminophen (TYLENOL) oral liquid 160 mg/5 mL  1,000 mg Per Tube Q6H  . aspirin EC  325 mg Oral Daily   Or  . aspirin  324 mg Per Tube Daily  . atorvastatin  10 mg Oral QPM  . bisacodyl  10 mg Oral Daily   Or  . bisacodyl  10 mg Rectal Daily  . chlorhexidine  15 mL Mouth Rinse BID  . Chlorhexidine Gluconate Cloth  6 each Topical Daily  . docusate sodium  200 mg Oral Daily  . insulin aspart  0-24 Units Subcutaneous Q4H  .  ketorolac  15 mg Intravenous Q6H  . mouth rinse  15 mL Mouth Rinse q12n4p  . [START ON 08/11/2019] pantoprazole  40 mg Oral Daily  . sodium chloride flush  10-40 mL Intracatheter Q12H  . sodium chloride flush  3 mL Intravenous Q12H   Continuous Infusions: . sodium chloride 20 mL/hr at 08/10/19 0700  . sodium chloride    . sodium chloride 10 mL/hr at 08/10/19 0200  . albumin human Stopped (08/09/19 1900)  . cefUROXime (ZINACEF)  IV Stopped (08/10/19 [id number])  . dexmedetomidine (PRECEDEX) IV infusion Stopped (08/09/19 1410)  . lactated ringers    . lactated ringers    . lactated ringers 20 mL/hr at 08/10/19 0700  . nitroGLYCERIN 20 mcg/min (08/10/19 0700)  . phenylephrine (NEO-SYNEPHRINE) Adult infusion Stopped (08/09/19 1746)   PRN Meds:.sodium chloride, albumin human, dextrose, fentaNYL (SUBLIMAZE) injection, lactated ringers, metoprolol tartrate, midazolam, ondansetron (ZOFRAN) IV, oxyCODONE, sodium chloride flush, sodium chloride flush  General appearance: alert, cooperative and no distress Heart: regular rate and rhythm Lungs: clear to auscultation bilaterally Abdomen: soft, non-tender; bowel sounds normal; no masses,  no organomegaly Extremities: extremities normal, atraumatic, no cyanosis or edema Wound: aquacel in place  Lab Results: CBC: Recent Labs  08/09/19 1858 08/10/19 0409  WBC 14.9* 12.4*  HGB 13.3 12.2*  HCT 40.2 37.1*  PLT 116* 118*   BMET:  Recent Labs    08/09/19 1858 08/10/19 0409  NA 138 136  K 4.4 4.3  CL 109 107  CO2 21* 21*  GLUCOSE 125* 125*  BUN 13 15  CREATININE 0.55* 0.57*  CALCIUM 7.7* 7.8*    CMET: Lab Results  Component Value Date   WBC 12.4 (H) 08/10/2019   HGB 12.2 (L) 08/10/2019   HCT 37.1 (L) 08/10/2019   PLT 118 (L) 08/10/2019   GLUCOSE 125 (H) 08/10/2019   CHOL 174 06/28/2019   TRIG 375 (H) 06/28/2019   HDL 53 06/28/2019   LDLCALC 76 06/28/2019   ALT 36 08/07/2019   AST 23 08/07/2019   NA 136 08/10/2019   K 4.3  08/10/2019   CL 107 08/10/2019   CREATININE 0.57 (L) 08/10/2019   BUN 15 08/10/2019   CO2 21 (L) 08/10/2019   INR 1.2 08/09/2019   HGBA1C 5.4 08/07/2019      PT/INR:  Recent Labs    08/09/19 1319  LABPROT 14.6  INR 1.2   Radiology: [facility] Chest Port 1 View  Result Date: 08/09/2019 CLINICAL DATA:  54 year old male status post aortic valve replacement. PFO. Postoperative day 0. EXAM: PORTABLE CHEST 1 VIEW COMPARISON:  Chest radiographs 08/07/2019. FINDINGS: Portable AP semi upright view at 1331 hours. Endotracheal tube tip just above the level the clavicles. Enteric tube terminates in the stomach with side hole at the level of the gastric body. Right IJ approach Swan-Ganz catheter tip is at the right lower lobe pulmonary artery level at the hilum. Two mediastinal or midline chest tubes are in place. Lower lung volumes. Stable cardiac size and mediastinal contours. Prosthetic aortic valve. Perihilar atelectasis. No pneumothorax, pulmonary edema or pleural effusion identified. Negative visible bowel gas pattern. IMPRESSION: 1. Lines and tubes appear appropriately placed. 2. Lower lung volumes with perihilar atelectasis. No pneumothorax or pulmonary edema. Electronically Signed   By: Genevie Ann M.D.   On: 08/09/2019 13:39     Assessment/Plan: S/P Procedure(s) (LRB): AORTIC VALVE REPLACEMENT (AVR) using Edwards INSPIRIS Resilia 29 MM Aortic Valve. (N/A) TRANSESOPHAGEAL ECHOCARDIOGRAM (TEE) (N/A) Closure Of Patent Foramen Ovale (N/A)  1. CV- NSR, BP okay... not requiring any drips.. will start low dose BB 2. Pulm- no acute issues, CXR with some atelectasis present, CT output 140 output.. continue IS.Marland Kitchen leave chest tubes in today 3. Renal- creatinine stable, K WNL.Marland Kitchen will start gentle diuresis today  IV Lasix 20 mg BID x 2 doses 4. Expected post operative blood loss anemia, mild Hgb at 12.2 5. Expected post operative thrombocytopenia, mild at 118 monitor 6. Dispo- patient in NSR, start low dose  BB.Marland Kitchen expected level of pain, continue pain medications as able.. diurese. POD #1 progression orders     Ellwood Handler, PA-C  08/10/2019 8:32 AM    I have seen and examined the patient and agree with the assessment and plan as outlined.  Doing well POD1.  Maintaining NSR w/ stable hemodynamics, no drips.  EKG w/ NSR and RBBB unchanged from pre-op.  Breathing comfortably w/ O2 sats 97% on 2 L/min and CXR looks good.  Mobilize.  D/C lines.  Diuresis.  D/C tubes later today or tomorrow, depending on output.  Rexene Alberts, MD 08/10/2019 8:46 AM

## 2019-08-10 NOTE — Hospital Course (Addendum)
Donald [name] presented to [facility] on 08/09/2019.  He was taken to the operating room and underwent Aortic Valve Replacement with a 29 mm Edwards Inspiris Resilia stented bovine pericardial tissue valve.  No attempt was made to repair his valve due to extensive fenestrations involving all 3 leaflets.  He was noted to have moderate-severe LV chamber enlargement and systolic dysfunction at baseline.  He tolerated the procedure without difficulty and was taken to the SICU in stable condition.  He was extubated without difficulty the evening of surgery.  During his stay in the SICU the patient did not require any inotropic support.  He experienced expected post operative pain.  He was in NSR and started on low dose BB.  His chest tubes and arterial lines were removed without difficulty.

## 2019-08-10 NOTE — Progress Notes (Signed)
CT surgery p.m. Rounds  Patient doing well after AVR yesterday. Receiving Toradol and narcotics for pain. Sinus rhythm with minimal chest tube output P.m. labs are satisfactory

## 2019-08-11 ENCOUNTER — Inpatient Hospital Stay (HOSPITAL_COMMUNITY): Payer: BC Managed Care – PPO

## 2019-08-11 LAB — BASIC METABOLIC PANEL
Anion gap: 6 (ref 5–15)
BUN: 10 mg/dL (ref 6–20)
CO2: 28 mmol/L (ref 22–32)
Calcium: 8.2 mg/dL — ABNORMAL LOW (ref 8.9–10.3)
Chloride: 102 mmol/L (ref 98–111)
Creatinine, Ser: 0.62 mg/dL (ref 0.61–1.24)
GFR calc Af Amer: >60 mL/min (ref >60)
GFR calc non Af Amer: >60 mL/min (ref >60)
Glucose, Bld: 100 mg/dL — ABNORMAL HIGH (ref 70–99)
Potassium: 3.7 mmol/L (ref 3.5–5.1)
Sodium: 136 mmol/L (ref 135–145)

## 2019-08-11 LAB — GLUCOSE, CAPILLARY
Glucose-Capillary: 107 mg/dL — ABNORMAL HIGH (ref 70–99)
Glucose-Capillary: 112 mg/dL — ABNORMAL HIGH (ref 70–99)
Glucose-Capillary: 114 mg/dL — ABNORMAL HIGH (ref 70–99)
Glucose-Capillary: 75 mg/dL (ref 70–99)
Glucose-Capillary: 95 mg/dL (ref 70–99)

## 2019-08-11 LAB — CBC
HCT: 35.4 % — ABNORMAL LOW (ref 39.0–52.0)
Hemoglobin: 11.5 g/dL — ABNORMAL LOW (ref 13.0–17.0)
MCH: 31.3 pg (ref 26.0–34.0)
MCHC: 32.5 g/dL (ref 30.0–36.0)
MCV: 96.2 fL (ref 80.0–100.0)
Platelets: 104 10*3/uL — ABNORMAL LOW (ref 150–400)
RBC: 3.68 MIL/uL — ABNORMAL LOW (ref 4.22–5.81)
RDW: 12.1 % (ref 11.5–15.5)
WBC: 9.6 10*3/uL (ref 4.0–10.5)
nRBC: 0 % (ref 0.0–0.2)

## 2019-08-11 MED ORDER — FENTANYL CITRATE (PF) 100 MCG/2ML IJ SOLN: 50.0000 ug | INTRAMUSCULAR | Status: DC | PRN

## 2019-08-11 MED ORDER — GABAPENTIN 300 MG PO CAPS
300.0000 mg | ORAL_CAPSULE | Freq: Two times a day (BID) | ORAL | Status: DC
  Administered 2019-08-11 – 2019-08-12 (×3): 300 mg via ORAL
  Filled 2019-08-11 (×4): qty 1

## 2019-08-11 MED ORDER — FENTANYL 75 MCG/HR TD PT72
1.0000 | MEDICATED_PATCH | TRANSDERMAL | Status: DC
  Administered 2019-08-11: 12:00:00 1 via TRANSDERMAL
  Filled 2019-08-11: qty 1

## 2019-08-11 MED ORDER — KETOROLAC TROMETHAMINE 15 MG/ML IJ SOLN
15.0000 mg | Freq: Four times a day (QID) | INTRAMUSCULAR | Status: AC
  Administered 2019-08-11 – 2019-08-13 (×7): 15 mg via INTRAVENOUS
  Filled 2019-08-11 (×7): qty 1

## 2019-08-11 MED ORDER — POTASSIUM CHLORIDE CRYS ER 20 MEQ PO TBCR
20.0000 meq | EXTENDED_RELEASE_TABLET | ORAL | Status: AC
  Administered 2019-08-11 (×2): 20 meq via ORAL
  Filled 2019-08-11 (×2): qty 1

## 2019-08-11 MED ORDER — INSULIN ASPART 100 UNIT/ML ~~LOC~~ SOLN: 0.0000 [IU] | Freq: Three times a day (TID) | SUBCUTANEOUS | Status: DC

## 2019-08-11 NOTE — Progress Notes (Signed)
2 Days Post-Op Procedure(s) (LRB): AORTIC VALVE REPLACEMENT (AVR) using Edwards INSPIRIS Resilia 29 MM Aortic Valve. (N/A) TRANSESOPHAGEAL ECHOCARDIOGRAM (TEE) (N/A) Closure Of Patent Foramen Ovale (N/A) Subjective: Still with severe incisional pain nsr Labs ok  Objective: Vital signs in last 24 hours: Temp:  [96.8 F (36 C)-98.5 F (36.9 C)] 98.5 F (36.9 C) (04/17 0809) Pulse Rate:  [67-76] 76 (04/17 0916) Cardiac Rhythm: Normal sinus rhythm (04/17 0800) Resp:  [9-20] 10 (04/17 1000) BP: (88-154)/(62-105) 117/82 (04/17 1000) SpO2:  [92 %-100 %] 98 % (04/17 1000) Arterial Line BP: (140-155)/(66-73) 149/68 (04/16 1130) Weight:  [92.3 kg] 92.3 kg (04/17 0527)  Hemodynamic parameters for last 24 hours: PAP: (23-28)/(9-14) 28/14  Intake/Output from previous day: 04/16 0701 - 04/17 0700 In: 617.6 [I.V.:517.4; IV Piggyback:100.2] Out: [id number] [Urine:4200; Chest Tube:180] Intake/Output this shift: Total I/O In: 140.7 [I.V.:40.6; IV Piggyback:100] Out: 350 [Urine:350]  no murmur  Lab Results: Recent Labs    08/10/19 1640 08/11/19 0531  WBC 11.6* 9.6  HGB 11.9* 11.5*  HCT 36.2* 35.4*  PLT 114* 104*   BMET:  Recent Labs    08/10/19 1640 08/11/19 0531  NA 132* 136  K 4.2 3.7  CL 100 102  CO2 26 28  GLUCOSE 117* 100*  BUN 13 10  CREATININE 0.59* 0.62  CALCIUM 8.1* 8.2*    PT/INR:  Recent Labs    08/09/19 1319  LABPROT 14.6  INR 1.2   ABG    Component Value Date/Time   PHART 7.288 (L) 08/09/2019 1605   HCO3 22.4 08/09/2019 1605   TCO2 24 08/09/2019 1605   ACIDBASEDEF 5.0 (H) 08/09/2019 1605   O2SAT 98.0 08/09/2019 1605   CBG (last 3)  Recent Labs    08/10/19 2029 08/11/19 0006 08/11/19 0808  GLUCAP 110* 107* 75    Assessment/Plan: S/P Procedure(s) (LRB): AORTIC VALVE REPLACEMENT (AVR) using Edwards INSPIRIS Resilia 29 MM Aortic Valve. (N/A) TRANSESOPHAGEAL ECHOCARDIOGRAM (TEE) (N/A) Closure Of Patent Foramen Ovale  (N/A) Mobilize Diuresis Diabetes control See progression orders   LOS: 2 days    Donald [name] 08/11/2019

## 2019-08-11 NOTE — Progress Notes (Addendum)
CT surgery p.m. Rounds  Patient definitely improved this evening Out of bed to chair and starting to walk in hallway Sinus rhythm Plan transfer to stepdown tomorrow

## 2019-08-12 ENCOUNTER — Inpatient Hospital Stay (HOSPITAL_COMMUNITY): Payer: BC Managed Care – PPO

## 2019-08-12 LAB — GLUCOSE, CAPILLARY
Glucose-Capillary: 83 mg/dL (ref 70–99)
Glucose-Capillary: 97 mg/dL (ref 70–99)

## 2019-08-12 LAB — COMPREHENSIVE METABOLIC PANEL
ALT: 31 U/L (ref 0–44)
AST: 34 U/L (ref 15–41)
Albumin: 2.8 g/dL — ABNORMAL LOW (ref 3.5–5.0)
Alkaline Phosphatase: 57 U/L (ref 38–126)
Anion gap: 6 (ref 5–15)
BUN: 13 mg/dL (ref 6–20)
CO2: 28 mmol/L (ref 22–32)
Calcium: 8.3 mg/dL — ABNORMAL LOW (ref 8.9–10.3)
Chloride: 105 mmol/L (ref 98–111)
Creatinine, Ser: 0.73 mg/dL (ref 0.61–1.24)
GFR calc Af Amer: >60 mL/min (ref >60)
GFR calc non Af Amer: >60 mL/min (ref >60)
Glucose, Bld: 103 mg/dL — ABNORMAL HIGH (ref 70–99)
Potassium: 4.3 mmol/L (ref 3.5–5.1)
Sodium: 139 mmol/L (ref 135–145)
Total Bilirubin: 0.5 mg/dL (ref 0.3–1.2)
Total Protein: 5.4 g/dL — ABNORMAL LOW (ref 6.5–8.1)

## 2019-08-12 LAB — CBC
HCT: 36.3 % — ABNORMAL LOW (ref 39.0–52.0)
Hemoglobin: 11.9 g/dL — ABNORMAL LOW (ref 13.0–17.0)
MCH: 32.2 pg (ref 26.0–34.0)
MCHC: 32.8 g/dL (ref 30.0–36.0)
MCV: 98.4 fL (ref 80.0–100.0)
Platelets: 114 10*3/uL — ABNORMAL LOW (ref 150–400)
RBC: 3.69 MIL/uL — ABNORMAL LOW (ref 4.22–5.81)
RDW: 12 % (ref 11.5–15.5)
WBC: 7.6 10*3/uL (ref 4.0–10.5)
nRBC: 0 % (ref 0.0–0.2)

## 2019-08-12 MED ORDER — SORBITOL 70 % SOLN
60.0000 mL | Freq: Once | Status: AC
  Administered 2019-08-12: 12:00:00 60 mL via ORAL
  Filled 2019-08-12: qty 60

## 2019-08-12 MED ORDER — METOPROLOL TARTRATE 25 MG PO TABS
25.0000 mg | ORAL_TABLET | Freq: Two times a day (BID) | ORAL | Status: DC
  Administered 2019-08-12 – 2019-08-13 (×3): 25 mg via ORAL
  Filled 2019-08-12 (×3): qty 1

## 2019-08-12 NOTE — Progress Notes (Signed)
Pt transferred to 4E-09 via wheelchair from [facility]. Pt walked to bed. Pt given CHG bath. Tele applied, CCMD notified. Pt oriented to call bell, bed and room. Call bell within reach. VSS. Will continue to monitor.  Amanda Cockayne, RN

## 2019-08-13 ENCOUNTER — Inpatient Hospital Stay (HOSPITAL_COMMUNITY): Payer: BC Managed Care – PPO

## 2019-08-13 LAB — BASIC METABOLIC PANEL
Anion gap: 9 (ref 5–15)
BUN: 23 mg/dL — ABNORMAL HIGH (ref 6–20)
CO2: 26 mmol/L (ref 22–32)
Calcium: 8.4 mg/dL — ABNORMAL LOW (ref 8.9–10.3)
Chloride: 104 mmol/L (ref 98–111)
Creatinine, Ser: 1.11 mg/dL (ref 0.61–1.24)
GFR calc Af Amer: >60 mL/min (ref >60)
GFR calc non Af Amer: >60 mL/min (ref >60)
Glucose, Bld: 108 mg/dL — ABNORMAL HIGH (ref 70–99)
Potassium: 3.9 mmol/L (ref 3.5–5.1)
Sodium: 139 mmol/L (ref 135–145)

## 2019-08-13 LAB — CBC
HCT: 32.4 % — ABNORMAL LOW (ref 39.0–52.0)
Hemoglobin: 10.7 g/dL — ABNORMAL LOW (ref 13.0–17.0)
MCH: 31.8 pg (ref 26.0–34.0)
MCHC: 33 g/dL (ref 30.0–36.0)
MCV: 96.4 fL (ref 80.0–100.0)
Platelets: 118 10*3/uL — ABNORMAL LOW (ref 150–400)
RBC: 3.36 MIL/uL — ABNORMAL LOW (ref 4.22–5.81)
RDW: 11.9 % (ref 11.5–15.5)
WBC: 5.2 10*3/uL (ref 4.0–10.5)
nRBC: 0 % (ref 0.0–0.2)

## 2019-08-13 LAB — ECHO INTRAOPERATIVE TEE
Height: 74 in
Weight: 3113.6 oz

## 2019-08-13 MED ORDER — OXYCODONE HCL 5 MG PO TABS: 5.0000 mg | ORAL_TABLET | ORAL | 0 refills | Status: DC | PRN

## 2019-08-13 MED ORDER — METOPROLOL TARTRATE 25 MG PO TABS: 25.0000 mg | ORAL_TABLET | Freq: Two times a day (BID) | ORAL | 2 refills | Status: DC

## 2019-08-13 MED ORDER — ASPIRIN 325 MG PO TBEC: 325.0000 mg | DELAYED_RELEASE_TABLET | Freq: Every day | ORAL | 0 refills | Status: DC

## 2019-08-13 MED ORDER — OXYCODONE HCL 5 MG PO TABS
10.0000 mg | ORAL_TABLET | Freq: Once | ORAL | Status: AC
  Administered 2019-08-13: 15:00:00 10 mg via ORAL
  Filled 2019-08-13: qty 2

## 2019-08-13 MED ORDER — FUROSEMIDE 40 MG PO TABS
40.0000 mg | ORAL_TABLET | Freq: Every day | ORAL | Status: AC
  Administered 2019-08-13: 40 mg via ORAL
  Filled 2019-08-13: qty 1

## 2019-08-13 NOTE — Anesthesia Postprocedure Evaluation (Signed)
Anesthesia Post Note  Patient: Donald [name]  Procedure(s) Performed: AORTIC VALVE REPLACEMENT (AVR) using Edwards INSPIRIS Resilia 29 MM Aortic Valve. (N/A Chest) TRANSESOPHAGEAL ECHOCARDIOGRAM (TEE) (N/A ) Closure Of Patent Foramen Ovale (N/A Chest)     Patient location during evaluation: SICU Anesthesia Type: General Level of consciousness: sedated Pain management: pain level controlled Vital Signs Assessment: post-procedure vital signs reviewed and stable Respiratory status: patient remains intubated per anesthesia plan Cardiovascular status: stable Postop Assessment: no apparent nausea or vomiting Anesthetic complications: no    Last Vitals:  Vitals:   08/12/19 2315 08/13/19 0320  BP: 121/70 (!) 148/86  Pulse: 80 70  Resp: 12 10  Temp: 36.6 C 37.1 C  SpO2: 96% 98%    Last Pain:  Vitals:   08/13/19 0320  TempSrc: Oral  PainSc: Donald [name]

## 2019-08-13 NOTE — Progress Notes (Signed)
CARDIAC REHAB PHASE I   PRE:  Rate/Rhythm: 83 SR  BP:  Sitting: 146/96      SaO2: 97 RA  MODE:  Ambulation: >950 ft   POST:  Rate/Rhythm: 93 SR  BP:  Sitting: 155/98    SaO2: 98 RA  Pt ambulated >925ft in hallway independently with steady gait. Pt denies SOB or dizziness. Pt getting EPW d/c'd and will d/c later today. Will return to complete education.  [id number] Rufina Falco, RN BSN 08/13/2019 8:58 AM

## 2019-08-13 NOTE — Progress Notes (Signed)
Patient given discharge instructions , medication list with next dose due, and follow up appointments given. IV and tele were dcd. Will be discharged home as ordered. Transported to exit via wheel chair and nursing staff. Devory Mckinzie, Bettina Gavia RN

## 2019-08-13 NOTE — Progress Notes (Addendum)
4 Days Post-Op Procedure(s) (LRB): AORTIC VALVE REPLACEMENT (AVR) using [employer] INSPIRIS Resilia 29 MM Aortic Valve. (N/A) TRANSESOPHAGEAL ECHOCARDIOGRAM (TEE) (N/A) Closure Of Patent Foramen Ovale (N/A) Subjective: Awake and alert, no concerns. Pain controlled. Independent with mobility. Several BM's after Sorbitol.   Objective: Vital signs in last 24 hours: Temp:  [97.8 F (36.6 C)-99.2 F (37.3 C)] 98.8 F (37.1 C) (04/19 0320) Pulse Rate:  [70-84] 70 (04/19 0320) Cardiac Rhythm: Normal sinus rhythm (04/19 0320) Resp:  [10-26] 10 (04/19 0320) BP: (120-148)/(70-96) 148/86 (04/19 0320) SpO2:  [90 %-100 %] 98 % (04/19 0320)     Intake/Output from previous day: 04/18 0701 - 04/19 0700 In: 267.7 [P.O.:240; I.V.:27.7] Out: -  Intake/Output this shift: No intake/output data recorded.  General appearance: alert, cooperative and no distress Neurologic: intact Heart: regular rate and rhythm Lungs: clear to auscultation bilaterally, CXR shows clear lung fields, no significant effusions. Abdomen: Soft, non-tender Extremities: No peripheral edema Wound: The sternal incision is well approximated and dry.   Lab Results: Recent Labs    08/12/19 0230 08/13/19 0244  WBC 7.6 5.2  HGB 11.9* 10.7*  HCT 36.3* 32.4*  PLT 114* 118*   BMET:  Recent Labs    08/12/19 0230 08/13/19 0244  NA 139 139  K 4.3 3.9  CL 105 104  CO2 28 26  GLUCOSE 103* 108*  BUN 13 23*  CREATININE 0.73 1.11  CALCIUM 8.3* 8.4*    PT/INR: No results for input(s): LABPROT, INR in the last 72 hours. ABG    Component Value Date/Time   PHART 7.288 (L) 08/09/2019 1605   HCO3 22.4 08/09/2019 1605   TCO2 24 08/09/2019 1605   ACIDBASEDEF 5.0 (H) 08/09/2019 1605   O2SAT 98.0 08/09/2019 1605   CBG (last 3)  Recent Labs    08/11/19 2104 08/12/19 0658 08/12/19 1220  GLUCAP 114* 83 97    Assessment/Plan: S/P Procedure(s) (LRB): AORTIC VALVE REPLACEMENT (AVR) using [employer] INSPIRIS Resilia 29 MM  Aortic Valve. (N/A) TRANSESOPHAGEAL ECHOCARDIOGRAM (TEE) (N/A) Closure Of Patent Foramen Ovale (N/A)  -POD4 tissue AVR for severe AI and closure of PFO. Making a progressive recovery. On RA, independent with ambulation, has stable SR. CXR is satisfactory. He has adequate support at home.  Will remove pacer wires this AM. Possible discharge later today or in AM.    LOS: 4 days    Antony Odea, PA-C [id number] 08/13/2019   I have seen and examined the patient and agree with the assessment and plan as outlined.  D/C home today.  Instructions given  Rexene Alberts, MD 08/13/2019 8:52 AM

## 2019-08-13 NOTE — Discharge Summary (Signed)
Physician Discharge Summary  Patient ID: Donald [name] MRN: [id number] DOB/AGE: [birth year] 54 y.o.  Admit date: 08/09/2019 Discharge date: 08/13/2019  Admission Diagnoses: Severe aortic insufficiency Patent foremen [place] Dyslipidemia Hypertension Left ventricular hypertrophy  Discharge Diagnoses:  Principal Problem:   S/P aortic valve replacement with bioprosthetic valve Active Problems:   RBBB (right bundle branch block)   Nonrheumatic aortic valve insufficiency   Severe aortic insufficiency   Patent foramen ovale   S/P patent foramen ovale closure   S/P AVR (aortic valve replacement)   Discharged Condition: stable  History of Present Illness:  Patient is a 54 year old male with no previous cardiac history who has been referred for surgical consultation to discuss recently diagnosed severe aortic insufficiency.  Patient states that he was told when he was younger there is a possible history of rheumatic fever.  He has not previously been told that he had a heart murmur.  He recently changed primary care physicians and was found to have a abnormal EKG which prompted referral for a formal cardiology consultation.  The patient was seen by Dr. [name] on June 28, 2019 and EKG revealed sinus rhythm with occasional PVC and right bundle branch block.  Transthoracic echocardiogram was performed June 28, 2019 and revealed moderate global left ventricular systolic dysfunction with ejection fraction estimated only 30 to 35%.  There was moderate to severe left ventricular chamber enlargement with end-diastolic left ventricular internal diameter reported 6.9 cm.  There was prolapse of what was reported to be the noncoronary cusp of the aortic valve with at least moderate to severe aortic insufficiency.  Aortic valve pressure half-time was reported 234 ms.  TEE was performed July 05, 2019 and confirmed the presence of a trileaflet aortic valve with severe aortic insufficiency with isolated  prolapse involving the left coronary cusp.  Left ventricular systolic function was moderately reduced with ejection fraction estimated 30 to 35%.  There was left ventricular chamber enlargement although neither end-systolic nor end-diastolic chamber dimensions were reported.  There was a small patent foramen ovale.  Cardiothoracic surgical consultation was requested.  Patient is married and lives with his wife in [place].  He has a total of 4 children, 3 from previous marriage.  His youngest child is 107 months old.  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  This job requires significant strenuous activity with occasional lifting.  The patient states that he is on his feet all day long at work.  He specifically denies any symptoms of exertional shortness of breath or chest discomfort.  He has not noticed any significant change in his exercise tolerance.  He denies any history of PND, orthopnea, or lower extremity edema.  He has not had palpitations, dizzy spells, nor syncope.  He admits to significant alcohol consumption and he has a long history of tobacco use.  He reports no significant physical limitations.  Hospital Course:   Donald [name] presented to [facility] on 08/09/2019.  He was taken to the operating room and underwent Aortic Valve Replacement with a 29 mm Edwards Inspiris Resilia stented bovine pericardial tissue valve.  No attempt was made to repair his valve due to extensive fenestrations involving all 3 leaflets.  He was noted to have moderate-severe LV chamber enlargement and systolic dysfunction at baseline.  He tolerated the procedure without difficulty and was taken to the SICU in stable condition.  He was extubated without difficulty the evening of surgery.  During his stay in  the SICU the patient did not require any inotropic support.  He experienced expected post operative pain.  He was in NSR and started on low dose BB.  His  chest tubes and arterial lines were removed without difficulty. Follow up CXR showed no unexpected changes.  Diet and activity were advanced and well tolerated. The pacer wires were removed on post-op day 4 without complication. He was ready for discharge on post-op day 4.   Consults: None  Significant Diagnostic Studies:   ECHOCARDIOGRAM REPORT   Patient Name: Donald [name] Date of Exam: 06/28/2019  Medical Rec #: [id number] Height: 74.0 in  Accession #: [id number] Weight: 202.0 lb  Date of Birth: 04-Apr-1966 BSA: 2.183 m  Patient Age: 26 years BP: 157/75 mmHg  Patient Gender: M HR: 79 bpm.  Exam Location: [facility]   Procedure: 2D Echo, Cardiac Doppler and Color Doppler   Indications: I45.10 (ICD-10-CM) - RBBB (right bundle branch block)  I49.3 (ICD-10-CM) - PVC (premature ventricular  contraction)  I51.7 (ICD-10-CM) - LVH (left ventricular hypertrophy)  R94.31 (ICD-10-CM) - Abnormal EKG   History: Patient has no prior history of Echocardiogram  examinations.  Arrythmias:RBBB; Risk Factors:Hypertension, Dyslipidemia  and  Current Smoker. LVH (left ventricular hypertrophy),RBBB  (right  bundle branch block).   Sonographer: BW  Referring Phys: [place]    1. Left ventricular ejection fraction, by estimation, is 30 to 35%. The  left ventricle has moderately decreased function. The left ventricle  demonstrates global hypokinesis. The left ventricular internal cavity size  was moderately to severely dilated.  There is moderate left ventricular hypertrophy. Left ventricular diastolic  parameters are indeterminate.  2. Right ventricular systolic function is normal. The right ventricular  size is normal.  3. Left atrial size was severely dilated.  4. The mitral valve is normal in structure and function. No evidence of  mitral valve regurgitation. No evidence of mitral stenosis.  5. Prolapse of the aortic valve non coronary cusp with resultant    eccentric regurgitation. The jet is eccentric and not able to measure vena  contracta. By PHT (right around 200) AI qualifies as moderate to severe.  Even though the jet is very eccentric by  color alone in several views looks to be severe. The Doppler of the  aortic arch is limited, cannot verify degree of diastolic flow reversal.  Given degree of LV dilatation and LV dysfunction consider TEE to better  quantify severity of AI and precise  anatomy of the aortic valve. . The aortic valve has an indeterminant  number of cusps. Aortic valve regurgitation is moderate to severe. No  aortic stenosis is present.  6. Aortic dilatation noted. There is mild dilatation of the aortic root  measuring 40 mm.  7. The inferior vena cava is normal in size with greater than 50%  respiratory variability, suggesting right atrial pressure of 3 mmHg.   FINDINGS  Left Ventricle: Left ventricular ejection fraction, by estimation, is 30  to 35%. The left ventricle has moderately decreased function. The left  ventricle demonstrates global hypokinesis. The left ventricular internal  cavity size was moderately to  severely dilated. There is moderate left ventricular hypertrophy. Left  ventricular diastolic parameters are indeterminate. Normal left  ventricular filling pressure.   Right Ventricle: The right ventricular size is normal. No increase in  right ventricular wall thickness. Right ventricular systolic function is  normal.   Left Atrium: Left atrial size was severely dilated.   Right  Atrium: Right atrial size was normal in size.   Pericardium: There is no evidence of pericardial effusion.   Mitral Valve: The mitral valve is normal in structure and function. No  evidence of mitral valve regurgitation. No evidence of mitral valve  stenosis.   Tricuspid Valve: The tricuspid valve is normal in structure. Tricuspid  valve regurgitation is not demonstrated. No evidence of tricuspid  stenosis.    Aortic Valve: Prolapse of the aortic valve non coronary cusp with  resultant eccentric regurgitation. The jet is eccentric and not able to  measure vena contracta. By PHT (right around 200) AI qualifies as moderate  to severe. Even though the jet is very  eccentric by color alone in several views looks to be severe. The Doppler  of the aortic arch is limited, cannot verify degree of diastolic flow  reversal. Given degree of LV dilatation and LV dysfunction consider TEE to  better quantify severity of AI and  precise anatomy of the aortic valve. The aortic valve has an indeterminant  number of cusps. Aortic valve regurgitation is moderate to severe. Aortic  regurgitation PHT measures 234 msec. No aortic stenosis is present. Aortic  valve mean gradient measures  7.0 mmHg. Aortic valve peak gradient measures 12.4 mmHg. Aortic valve  area, by VTI measures 2.33 cm.   Pulmonic Valve: The pulmonic valve was not well visualized. Pulmonic valve  regurgitation is mild. No evidence of pulmonic stenosis.   Aorta: Aortic dilatation noted. There is mild dilatation of the aortic  root measuring 40 mm.   Pulmonary Artery: Indeterminate PASP, inadequate TR jet.   Venous: The inferior vena cava is normal in size with greater than 50%  respiratory variability, suggesting right atrial pressure of 3 mmHg.   IAS/Shunts: No atrial level shunt detected by color flow Doppler.    LEFT VENTRICLE  PLAX 2D  LVIDd: 6.90 cm Diastology  LVIDs: 6.20 cm LV e' lateral: 5.55 cm/s  LV PW: 1.50 cm LV E/e' lateral: 14.8  LV IVS: 1.49 cm LV e' medial: 9.30 cm/s  LVOT diam: 2.50 cm LV E/e' medial: 8.8  LV SV: 85  LV SV Index: 39  LVOT Area: 4.91 cm   LV Volumes (MOD)  LV vol d, MOD A2C: 280.0 ml  LV vol d, MOD A4C: 367.0 ml  LV vol s, MOD A2C: 176.0 ml  LV vol s, MOD A4C: 233.0 ml  LV SV MOD A2C: 104.0 ml  LV SV MOD A4C: 367.0 ml  LV SV MOD BP: 113.9 ml   RIGHT VENTRICLE  RV S prime: 11.70 cm/s   TAPSE (M-mode): 2.3 cm   LEFT ATRIUM Index RIGHT ATRIUM Index  LA diam: 3.50 cm 1.60 cm/m RA Area: 16.60 cm  LA Vol (A2C): 125.0 ml 57.27 ml/m RA Volume: 43.00 ml 19.70 ml/m  LA Vol (A4C): 94.6 ml 43.34 ml/m  LA Biplane Vol: 120.0 ml 54.98 ml/m  AORTIC VALVE  AV Area (Vmax): 2.30 cm  AV Area (Vmean): 2.39 cm  AV Area (VTI): 2.33 cm  AV Vmax: 176.00 cm/s  AV Vmean: 125.000 cm/s  AV VTI: 0.364 m  AV Peak Grad: 12.4 mmHg  AV Mean Grad: 7.0 mmHg  LVOT Vmax: 82.40 cm/s  LVOT Vmean: 60.800 cm/s  LVOT VTI: 0.173 m  LVOT/AV VTI ratio: 0.48  AI PHT: 234 msec   AORTA  Ao Root diam: 3.90 cm   MITRAL VALVE  MV Area (PHT): 3.45 cm SHUNTS  MV Decel Time: 220 msec Systemic VTI: 0.17  m  MV E velocity: 82.10 cm/s Systemic Diam: 2.50 cm  MV A velocity: 80.97 cm/s  MV E/A ratio: 1.01   Donald Dolly MD  Electronically signed by Donald Dolly MD  Signature Date/Time: 06/28/2019/12:40:58 PM     TRANSESOPHOGEAL ECHO REPORT   Patient Name: Donald [name] Date of Exam: 07/05/2019  Medical Rec #: [id number] Height: 74.0 in  Accession #: [id number] Weight: 197.0 lb  Date of Birth: June 18, 1965 BSA: 2.160 m  Patient Age: 53 years BP: 117/51 mmHg  Patient Gender: M HR: 69 bpm.  Exam Location: [facility]   Procedure: Transesophageal Echo   Indications: Aortic valve disorder 424.1 / I35.9   History: Patient has prior history of Echocardiogram examinations,  most  recent 06/28/2019. Abnormal ECG; Risk Factors:Hypertension,  Dyslipidemia and Current Smoker. RBBB, LVH.   Sonographer: Leavy Cella RDCS (AE)  Referring Phys: [place]: TEE procedure time was 15 minutes. The transesophogeal probe  was passed without difficulty through the esophogus of the patient. Imaged  were obtained with the patient in a left lateral decubitus position.  Sedation performed by different  physician. The patient was monitored while under deep sedation.  Anesthestetic  sedation was provided intravenously by Anesthesiology: 80mg   of Propofol. Image quality was good. The patient's vital signs; including  heart rate, blood pressure, and oxygen  saturation; remained stable throughout the procedure. The patient  developed no complications during the procedure.   IMPRESSIONS    1. Left ventricular ejection fraction, by estimation, is 30 to 35%. The  left ventricle has moderately decreased function. The left ventricle  demonstrates global hypokinesis. The left ventricular internal cavity size  was mildly dilated. There is moderate  left ventricular hypertrophy. Left ventricular diastolic function could  not be evaluated.  2. Right ventricular systolic function is normal. The right ventricular  size is normal.  3. Left atrial size was dilated. No left atrial/left atrial appendage  thrombus was detected. The LAA emptying velocity was 70 cm/s.  4. The mitral valve is grossly normal. Mild mitral valve regurgitation.  5. The aortic valve is tricuspid. There is bowing/prolapse of the  noncoronary cusp, likely incomplete coaptation with left coronary cusp  (cannot completely exclude a fenestration in the left coronary cusp).  Aortic valve regurgitation is very eccentric  with jet along the septum. Vena contracta is 0.8 cm. There is flow  reversal noted in the aortic arch and proximal descending aorta. Aortic  regurgitation is severe.  6. Evidence of atrial level shunting detected by color flow Doppler.  There is a small patent foramen ovale with predominantly left to right  shunting across the atrial septum.   FINDINGS  Left Ventricle: Left ventricular ejection fraction, by estimation, is 30  to 35%. The left ventricle has moderately decreased function. The left  ventricle demonstrates global hypokinesis. The left ventricular internal  cavity size was mildly dilated.  There is moderate left ventricular hypertrophy. Left ventricular diastolic  function could  not be evaluated.   Right Ventricle: The right ventricular size is normal. No increase in  right ventricular wall thickness. Right ventricular systolic function is  normal.   Left Atrium: Left atrial size was dilated. No left atrial/left atrial  appendage thrombus was detected. The LAA emptying velocity was 70 cm/s.   Right Atrium: Right atrial size was normal in size. Prominent Eustachian  valve.   Pericardium: There is no evidence of pericardial effusion.   Mitral Valve: The mitral valve is  grossly normal. Mild mitral valve  regurgitation.   Tricuspid Valve: The tricuspid valve is grossly normal. Tricuspid valve  regurgitation is trivial.   Aortic Valve: The aortic valve is tricuspid. Aortic valve regurgitation is  severe.   Pulmonic Valve: The pulmonic valve was not assessed. Pulmonic valve  regurgitation not assessed.   Aorta: The aortic root is normal in size and structure.   IAS/Shunts: Evidence of atrial level shunting detected by color flow  Doppler. Agitated saline contrast was given intravenously to evaluate for  intracardiac shunting. A small patent foramen ovale is detected with  predominantly left to right shunting  across the atrial septum.   Donald Lesches MD  Electronically signed by Donald Lesches MD  Signature Date/Time: 07/05/2019/11:43:31 AM    RIGHT/LEFT HEART CATH AND CORONARY ANGIOGRAPHY     Conclusion  Prox RCA lesion is 20% stenosed.  Dist RCA lesion is 10% stenosed.  3rd Mrg lesion is 10% stenosed.  Prox LAD to Mid LAD lesion is 20% stenosed.  1st Diag lesion is 30% stenosed. 1. Mild non-obstructive CAD  2. Severe aortic valve insufficiency.  Recommendations: Continue planning for aortic valve repair.      Recommendations  Antiplatelet/Anticoag Continue planning for aortic valve repair.     Surgeon Notes    07/05/2019 10:57 AM CV Procedure signed by Satira Sark, MD     Indications  Severe aortic insufficiency [I35.1  (ICD-10-CM)]     Procedural Details  Technical Details Indication: 54 yo male with severe AI. Planning for surgical repair.   Procedure: The risks, benefits, complications, treatment options, and expected outcomes were discussed with the patient. The patient and/or family concurred with the proposed plan, giving informed consent. The patient was brought to the cath lab after IV hydration was given. The patient was sedated with Versed and Fentanyl.The IV catheter present in the right antecubital vein was changed for a 5 [place] sheath. Right heart catheterization performed with a balloon tipped catheter. The right wrist was prepped and draped in a sterile fashion. 1% lidocaine was used for local anesthesia. Using the modified Seldinger access technique, a 5 French sheath was placed in the right radial artery. 3 mg Verapamil was given through the sheath. 4000 units IV heparin was given. Standard diagnostic catheters were used to perform selective coronary angiography. LV pressures measured with the JR4 catheter. The sheath was removed from the right radial artery and a Terumo hemostasis band was applied at the arteriotomy site on the right wrist.    Estimated blood loss <50 mL.   During this procedure medications were administered to achieve and maintain moderate conscious sedation while the patient's heart rate, blood pressure, and oxygen saturation were continuously monitored and I was present face-to-face 100% of this time.     Complications     Complications documented before study signed (07/26/2019 9:59 AM)  RIGHT/LEFT HEART CATH AND CORONARY ANGIOGRAPHY  None Documented by Burnell Blanks, MD 07/26/2019 9:52 AM  Date Found: 07/26/2019  Time Range: Intraprocedure       Coronary Findings  Diagnostic  Dominance: Right  Left Anterior Descending  Vessel is large.  Prox LAD to Mid LAD lesion 20% stenosed  Prox LAD to Mid LAD lesion is 20% stenosed.   First Diagonal Branch  1st Diag  lesion 30% stenosed  1st Diag lesion is 30% stenosed.   Left Circumflex  Vessel is large.   Third Obtuse Marginal Branch  3rd Mrg lesion 10% stenosed  3rd Mrg lesion is  10% stenosed.   Right Coronary Artery  Vessel is large.  Prox RCA lesion 20% stenosed  Prox RCA lesion is 20% stenosed.  Dist RCA lesion 10% stenosed  Dist RCA lesion is 10% stenosed.  Intervention  No interventions have been documented.  Coronary Diagrams  Diagnostic  Dominance: Right  &&&&&  Intervention       Implants  No implant documentation for this case.      Syngo Images Link to Procedure Log  Show images for CARDIAC CATHETERIZATION Procedure Log     Images on Long Term Storage   Show images for Donald [name], Donald "Chris"            Hemo Data    Most Recent Value  Fick Cardiac Output 7.08 L/min  Fick Cardiac Output Index 3.28 (L/min)/BSA  RA A Wave 10 mmHg  RA V Wave 7 mmHg  RA Mean 7 mmHg  RV Systolic Pressure 28 mmHg  RV Diastolic Pressure 4 mmHg  RV EDP 7 mmHg  PA Systolic Pressure 25 mmHg  PA Diastolic Pressure 13 mmHg  PA Mean 18 mmHg  PW A Wave 12 mmHg  PW V Wave 9 mmHg  PW Mean 7 mmHg  AO Systolic Pressure 97 mmHg  AO Diastolic Pressure 50 mmHg  AO Mean 72 mmHg  LV Systolic Pressure 123456 mmHg  LV Diastolic Pressure 14 mmHg  LV EDP 23 mmHg  AOp Systolic Pressure 99991111 mmHg  AOp Diastolic Pressure 49 mmHg  AOp Mean Pressure 73 mmHg  LVp Systolic Pressure 99991111 mmHg  LVp Diastolic Pressure 11 mmHg  LVp EDP Pressure 20 mmHg  QP/QS 1  TPVR Index 5.47 HRUI  TSVR Index 21.93 HRUI  PVR SVR Ratio 0.17  TPVR/TSVR Ratio 0.25     Cardiac TAVR CT  TECHNIQUE:  The patient was scanned on a [employer]. A 120 kV  retrospective scan was triggered in the descending thoracic aorta at  111 HU's. Gantry rotation speed was 250 msecs and collimation was .6  mm. No beta blockade or nitro were given. The 3D data set was  reconstructed in 5% intervals of the R-R cycle. Systolic  and  diastolic phases were analyzed on a dedicated work station using  MPR, MIP and VRT modes. The patient received 80 cc of contrast.  FINDINGS:  Image quality: Excellent.  Noise artifact is: Limited.  Valve Morphology: The aortic valve is tricuspid. There is severe  prolapse of the [place] which is the likely etiology of the severe  regurgitation. The aortic valve leaflets are thickened but there is  no leaflet calcification.  Aortic annular dimension:  Phase assessed: 10%  Annular area: 797 mm2  Annular perimeter: 102 mm  Max diameter: 34.1 mm  Min diameter: 31.3 mm  Annular and subannular calcification: No significant annular or  subannular calcification.  Optimal coplanar projection: RAO 15 CAU 7  Coronary Artery Height above Annulus:  Left Main: 13.5 mm  Right Coronary: 21.1 mm  Sinus of Valsalva Measurements:  Non-coronary: 40 mm  Right-coronary: 40 mm  Left-coronary: 41 mm  Sinus of Valsalva Height:  Non-coronary: 28.8 mm  Right-coronary: 33 mm  Left-coronary: 22.7 mm  Sinotubular Junction: 32 mm  Ascending Thoracic Aorta: 35 mm  Coronary Arteries: CAC score of 339 which is 99th percentile for  age- and sex-matched controls. Normal coronary origin. Right  dominance. The study was performed without use of NTG and is  insufficient for plaque evaluation. Refer to recent cardiac cath for  coronary evaluation.  Cardiac Morphology:  Right Atrium: Right atrial size is within normal limits.  Right Ventricle: The right ventricular cavity is within normal  limits.  Left Atrium: Left atrial size is normal in size with no left atrial  appendage filling defect. A small PFO is present.  Left Ventricle: The ventricular cavity size is severely dilated.  There are no stigmata of prior infarction. There is no abnormal  filling defect. LVEF is mildy reduced, 49%, with global hypokinesis.  Pulmonary arteries: Normal in size without proximal filling defect.  Pulmonary veins: Normal  pulmonary venous drainage.  Pericardium: Normal thickness with no significant effusion or  calcium present.  Mitral Valve: The mitral valve is normal structure without  significant calcification.  Extra-cardiac findings: See attached radiology report for  non-cardiac structures.  IMPRESSION:  1. Tricuspid aortic valve with severe prolapse of the non-coronary  cusp which is the etiology of the severe regurgitation.  2. Large annulus (797 mm2) which is not appropriate for TAVR  (surgical repair planned).  3. Small PFO.  4. Severely dilated left ventricular cavity.  5. CAC score of 339 which is 99th percentile for age- and  sex-matched controls.  Electronically Signed  By: Eleonore Chiquito  On: 08/03/2019 17:53  CT ANGIOGRAPHY CHEST, ABDOMEN AND PELVIS  TECHNIQUE:  Multidetector CT imaging through the chest, abdomen and pelvis was  performed using the standard protocol during bolus administration of  intravenous contrast. Multiplanar reconstructed images and MIPs were  obtained and reviewed to evaluate the vascular anatomy.  CONTRAST: 164mL OMNIPAQUE IOHEXOL 350 MG/ML SOLN  COMPARISON: None.  FINDINGS:  CTA CHEST FINDINGS  Cardiovascular: Cardiomegaly. No pericardial effusion. Central  pulmonary artery branches unremarkable; the exam was not optimized  for detection of pulmonary emboli. Scattered coronary  calcifications. Adequate contrast opacification of the thoracic  aorta with no evidence of dissection, aneurysm, or stenosis. Minimal  atheromatous plaque. No significant tortuosity.There is classic  3-vessel brachiocephalic arch anatomy without proximal stenosis.  Mediastinum/Nodes: No hilar or mediastinal adenopathy.  Lungs/Pleura: No pleural effusion. No pneumothorax. Lungs are clear.  Musculoskeletal: Spondylitic changes in the lower cervical spine.  DJD in bilateral shoulders. No fracture or worrisome bone lesion.  Review of the MIP images confirms the above findings.  CTA  ABDOMEN AND PELVIS FINDINGS  VASCULAR  Aorta: Minimal atheromatous plaque. No aneurysm, dissection,  stenosis, or significant tortuosity.  Celiac: Patent without evidence of aneurysm, dissection, vasculitis  or significant stenosis.  SMA: Patent without evidence of aneurysm, dissection, vasculitis or  significant stenosis.  Renals: Both renal arteries are patent without evidence of aneurysm,  dissection, vasculitis, fibromuscular dysplasia or significant  stenosis.  IMA: Patent without evidence of aneurysm, dissection, vasculitis or  significant stenosis.  Inflow: Mild tortuosity of the iliac arterial system with minimal  calcified plaque. No aneurysm, dissection, or stenosis.  Veins: No obvious venous abnormality within the limitations of this  arterial phase study. Patent portal vein and bilateral renal veins.  Review of the MIP images confirms the above findings.  NON-VASCULAR  Hepatobiliary: No focal liver abnormality is seen. No gallstones,  gallbladder wall thickening, or biliary dilatation.  Pancreas: Unremarkable. No pancreatic ductal dilatation or  surrounding inflammatory changes.  Spleen: Normal in size without focal abnormality.  Adrenals/Urinary Tract: 3 mm calculus, lower pole left renal  collecting system. No hydronephrosis. No renal mass. Adrenal glands  unremarkable. Urinary bladder incompletely distended.  Stomach/Bowel: Stomach is within normal limits. Appendix appears  normal. No evidence of bowel wall thickening,  distention, or  inflammatory changes.  Lymphatic: No enlarged abdominal or pelvic lymph nodes.  Reproductive: Prostate enlargement, mild.  Other: No ascites. No free air.  Musculoskeletal: Lower lumbar spondylitic change. No fracture or  worrisome bone lesion.  Review of the MIP images confirms the above findings.  IMPRESSION:  1. Negative for aortic dissection, aneurysm, or stenosis.  2. Mild tortuosity of the iliac arterial system with minimal   calcified plaque.  3. Coronary calcifications. The severity of coronary artery disease  and any potential stenosis cannot be assessed on this non-gated CT  examination.  4. 3 mm left renal calculus.  Aortic Atherosclerosis (ICD10-I70.0).  Electronically Signed  By: Lucrezia Europe M.D.  On: 08/03/2019 15:49    Treatments:   CARDIOTHORACIC SURGERY OPERATIVE NOTE  Date of Procedure:                08/09/2019  Preoperative Diagnosis:        Severe Aortic Insufficiency  Patent Foramen Ovale  Postoperative Diagnosis:    Same   Procedure:        Aortic Valve Replacement             Edwards Inspiris Resilia stented bovine pericardial tissue valve (size 29 mm, ref # [id number], serial # [id number])             Closure of Patent Foramen Ovale              Surgeon:        Valentina Gu. Roxy Manns, MD  Assistant:       Gaye Pollack, MD and Nicholes Rough, MD  Anesthesia:    Albertha Ghee, MD  Operative Findings: ? Leaflet prolapse of left and non-coronary leaflets ? Extensive fenestrations in all 3 leaflets ? Type II dysfunction with severe aortic insufficiency ? Moderate-severe left ventricular systolic dysfunction              BRIEF CLINICAL NOTE AND INDICATIONS FOR SURGERY  Patient is a 54 year old male with no previous cardiac history who has been referred for surgical consultation to discuss recently diagnosed severe aortic insufficiency.  Patient states that he was told when he was younger there is a possible history of rheumatic fever. He has not previously been told that he had a heart murmur.He recently changed primary care physicians and was found to have a abnormal EKG which prompted referral for a formal cardiology consultation. The patient was seen by Dr. [name] on June 28, 2019 and EKG revealed sinus rhythm with occasional PVC and right bundle branch block. Transthoracic echocardiogram was performed June 28, 2019 and revealed moderate global left  ventricular systolic dysfunction with ejection fraction estimated only 30 to 35%. There was moderate to severe left ventricular chamber enlargement with end-diastolic left ventricular internal diameter reported 6.9 cm. There was prolapse of what was reported to be the noncoronary cusp of the aortic valve with at least moderate to severe aortic insufficiency.Aortic valve pressure half-time was reported 234 ms. TEE was performed July 05, 2019 and confirmed the presence of a trileaflet aortic valve with severe aortic insufficiency with isolated prolapse involving the left coronary cusp. Left ventricular systolic function was moderately reduced with ejection fraction estimated 30 to 35%. There was left ventricular chamber enlargement although neither end-systolic nor end-diastolic chamber dimensions were reported. There was a small patent foramen ovale. Cardiothoracic surgical consultation was requested.  Discharge Exam: Blood pressure (!) 149/92, pulse 79, temperature 98.4 F (36.9 C), temperature source Oral, resp. rate 11, height 6'  2" (1.88 m), weight 91 kg, SpO2 99 %.  General appearance: alert, cooperative and no distress Neurologic: intact Heart: regular rate and rhythm Lungs: clear to auscultation bilaterally, CXR shows clear lung fields, no significant effusions. Abdomen: Soft, non-tender Extremities: No peripheral edema Wound: The sternal incision is well approximated and dry.   Disposition:  Discharged to home in stable condition  Discharge Instructions    Amb Referral to Cardiac Rehabilitation   Complete by: As directed    Diagnosis: Valve Replacement   Valve: Aortic   After initial evaluation and assessments completed: Virtual Based Care may be provided alone or in conjunction with Phase 2 Cardiac Rehab based on patient barriers.: Yes     Allergies as of 08/13/2019      Reactions   Ultram [tramadol Hcl] Itching   "Tunnel vision" warm feeling all over and wanted to jump  out of his skin      Medication List    TAKE these medications   aspirin 325 MG EC tablet Take 1 tablet (325 mg total) by mouth daily.   atorvastatin 10 MG tablet Commonly known as: LIPITOR Take 10 mg by mouth every evening.   CoQ10 100 MG Caps Take 100 mg by mouth daily.   FISH OIL PO Take 2,500 mg by mouth daily.   ibuprofen 200 MG tablet Commonly known as: ADVIL Take 400 mg by mouth every 8 (eight) hours as needed (pain.).   metoprolol tartrate 25 MG tablet Commonly known as: LOPRESSOR Take 1 tablet (25 mg total) by mouth 2 (two) times daily. What changed:   medication strength  how much to take  how to take this  when to take this  additional instructions   multivitamin with minerals Tabs tablet Take 1 tablet by mouth daily.   oxyCODONE 5 MG immediate release tablet Commonly known as: Oxy IR/ROXICODONE Take 1 tablet (5 mg total) by mouth every 4 (four) hours as needed for up to 7 days for severe pain.   Vitamin D3 50 MCG (2000 UT) Tabs Take 2,000 Units by mouth daily.      Follow-up Information    [facility] [place]. Go on 09/03/2019.   Specialty: Cardiothoracic Surgery Why: You have an appointment at Dr. Guy Sandifer office on Monday 09/03/19. Please arrive 30 minutes early for a chest X-ray to be performed by [facility] located on the first floor of the same building.  Contact information: [place], [place], [place], [place]. Go on 08/23/2019.   Specialties: Physician Assistant, Cardiology Why: Your appointment is at 2:00pm. Contact information: [address] [place] [address] [phone]        [facility] Go on 08/20/2019.   Specialty: Cardiothoracic Surgery Why: You have and appointment for suture removal at Dr. Guy Sandifer office on Monday 08/20/19 at 10:00am.  Contact information: [address] [place], [place] [phone]          Signed: Antony Odea, PA-C 08/13/2019, 2:23 PM

## 2019-08-13 NOTE — Progress Notes (Signed)
Pt ambulated x 900 feet in hall independently pt tolerated well

## 2019-08-13 NOTE — Progress Notes (Signed)
Patient EPW pulled per protocol and as ordered. All ends intact. Slight resistance met with right atrial wire but then was removed with out difficulty .  Patient reminded to lie supine approximately one hour. Heart rate 77 on monitor. Will continue to monitor patient. Jahlen Bollman, Bettina Gavia RN

## 2019-08-13 NOTE — Progress Notes (Signed)
CARDIAC REHAB PHASE I   D/c education completed with pt. Pt educated on importance of site care and monitoring incision daily. Encouraged continued ambulation, IS use, and sternal precautions. Pt given in-the-tube sheet. Reviewed restrictions and exercise guidelines. Will refer to CRP II .   [id number] Rufina Falco, RN BSN 08/13/2019 10:05 AM

## 2019-08-15 MED FILL — Heparin Sodium (Porcine) Inj 1000 Unit/ML: INTRAMUSCULAR | Qty: 30 | Status: AC

## 2019-08-15 MED FILL — Lidocaine HCl Local Preservative Free (PF) Inj 2%: INTRAMUSCULAR | Qty: 15 | Status: AC

## 2019-08-15 MED FILL — Potassium Chloride Inj 2 mEq/ML: INTRAVENOUS | Qty: 40 | Status: AC

## 2019-08-16 MED FILL — Electrolyte-R (PH 7.4) Solution: INTRAVENOUS | Qty: 4000 | Status: AC

## 2019-08-16 MED FILL — Sodium Chloride IV Soln 0.9%: INTRAVENOUS | Qty: 2000 | Status: AC

## 2019-08-16 MED FILL — Heparin Sodium (Porcine) Inj 1000 Unit/ML: INTRAMUSCULAR | Qty: 20 | Status: AC

## 2019-08-16 MED FILL — Mannitol IV Soln 20%: INTRAVENOUS | Qty: 500 | Status: AC

## 2019-08-16 MED FILL — Sodium Bicarbonate IV Soln 8.4%: INTRAVENOUS | Qty: 50 | Status: AC

## 2019-08-16 MED FILL — Lidocaine HCl Local Preservative Free (PF) Inj 2%: INTRAMUSCULAR | Qty: 15 | Status: AC

## 2019-08-20 ENCOUNTER — Other Ambulatory Visit: Payer: Self-pay | Admitting: Physician Assistant

## 2019-08-20 ENCOUNTER — Ambulatory Visit (INDEPENDENT_AMBULATORY_CARE_PROVIDER_SITE_OTHER): Payer: Self-pay

## 2019-08-20 ENCOUNTER — Other Ambulatory Visit: Payer: Self-pay

## 2019-08-20 DIAGNOSIS — Z4802 Encounter for removal of sutures: Secondary | ICD-10-CM

## 2019-08-20 DIAGNOSIS — Z952 Presence of prosthetic heart valve: Secondary | ICD-10-CM

## 2019-08-20 MED ORDER — OXYCODONE HCL 5 MG PO TABS: 5.0000 mg | ORAL_TABLET | Freq: Four times a day (QID) | ORAL | 0 refills | Status: AC | PRN

## 2019-08-20 NOTE — Progress Notes (Signed)
Mr Hutchings came into office today for suture removal s/p AVR 08/09/19. He had no sutures in place. He states that the chest tube sutures were loose, so he just pulled them out over the weekend. His incision site look good no signs of infection. He does have some scabbing and itching at incision sites. I recommended hydrogen peroxide on the scabs to soften and clean area with soap and water. He requested a refill on Oxycodone 5 mg RX. I will send this request to PA/MD and call patient later today with response

## 2019-08-23 ENCOUNTER — Other Ambulatory Visit: Payer: Self-pay

## 2019-08-23 ENCOUNTER — Encounter: Payer: Self-pay | Admitting: Student

## 2019-08-23 ENCOUNTER — Ambulatory Visit (INDEPENDENT_AMBULATORY_CARE_PROVIDER_SITE_OTHER): Payer: BC Managed Care – PPO | Admitting: Student

## 2019-08-23 VITALS — BP 110/60 | HR 80 | Temp 98.0°F | Ht 74.0 in | Wt 196.8 lb

## 2019-08-23 DIAGNOSIS — E782 Mixed hyperlipidemia: Secondary | ICD-10-CM

## 2019-08-23 DIAGNOSIS — I351 Nonrheumatic aortic (valve) insufficiency: Secondary | ICD-10-CM | POA: Diagnosis not present

## 2019-08-23 DIAGNOSIS — Z953 Presence of xenogenic heart valve: Secondary | ICD-10-CM | POA: Diagnosis not present

## 2019-08-23 DIAGNOSIS — I429 Cardiomyopathy, unspecified: Secondary | ICD-10-CM

## 2019-08-23 DIAGNOSIS — I10 Essential (primary) hypertension: Secondary | ICD-10-CM

## 2019-08-23 MED ORDER — METOPROLOL SUCCINATE ER 25 MG PO TB24: 25.0000 mg | ORAL_TABLET | Freq: Every day | ORAL | 11 refills | Status: DC

## 2019-08-23 NOTE — Progress Notes (Signed)
Cardiology Office Note    Date:  08/23/2019   ID:  Donald [name], DOB November 21, 1965, MRN [id number]  PCP:  Perlie Mayo, NP  Cardiologist: Rozann Lesches, MD    Chief Complaint  Patient presents with  . Hospitalization Follow-up    History of Present Illness:    Donald [name] is a 54 y.o. male with past medical history of secondary cardiomyopathy (EF 30 to 35% by echo in 06/2019), severe aortic regurgitation, HLD and prior tobacco use who presents to the office today for hospital follow-up.  He was examined by Dr. [name] in 06/2019 and denied any recent chest pain or dyspnea on exertion but had been referred as his new PCP detected a cardiac murmur and his EKG showed inferior and anterior lateral ST abnormalities. Echocardiogram showed an EF of 30 to 35% and he was found to have moderate to severe AI which was confirmed with TEE. He was referred to Dr. Roxy Manns with CT Surgery and catheterization along with CT Imaging were arranged. Cardiac catheterization by Dr. Angelena Form on 07/26/2019 showed mild nonobstructive CAD with severe AI. He presented to [facility] on 08/09/2019 for planned AVR which was performed using an Sharon stented bovine pericardial tissue valve. He also underwent closure of his PFO. He progressed well in the postoperative setting and maintained normal sinus rhythm. Lines were removed as able and he was discharged on postop day 4 (08/13/2019). Discharge medications included ASA 325 mg daily, Atorvastatin 10 mg daily, and Lopressor 25 mg twice daily.   In talking with the patient and his wife today, he reports overall doing well since his recent surgery. He continues to have incisional chest discomfort which is noticeably worse with deep breathing or coughing.  He denies any exertional chest pain. No recent dyspnea on exertion, orthopnea, PND or lower extremity edema.  He did develop dizziness yesterday and when he checked his BP, SBP was in the 90's. He called  CT surgery and they recommended he reduce his Lopressor to 12.5 mg twice daily. He had also been consuming a significant amount of concentrated beet juice and it was recommended to avoid this.  The patient and his wife have been watching his diet closely and monitoring sodium intake. He has quit smoking!   Past Medical History:  Diagnosis Date  . Bone tumor 11/22/2013  . Cardiomyopathy ([place])    a. EF 30 to 35% by echo in 06/2019 in the setting of severe AI  . Glenoid labral tear 10/27/2010  . Hyperlipidemia   . Rotator cuff tear, right 10/27/2010  . S/P aortic valve replacement with bioprosthetic valve 08/09/2019   29 mm Edwards Inspiris Resilia stented bovine pericardial tissue valve  . S/P patent foramen ovale closure 08/09/2019  . Shoulder injury 10/27/2010    Past Surgical History:  Procedure Laterality Date  . AORTIC VALVE REPLACEMENT N/A 08/09/2019   Procedure: AORTIC VALVE REPLACEMENT (AVR) using Margaretha Sheffield Resilia 29 MM Aortic Valve.;  Surgeon: Rexene Alberts, MD;  Location: [facility];  Service: Open Heart Surgery;  Laterality: N/A;  . BACK SURGERY     multiple back surgery  . Benign tumor     Left shin  . BUBBLE STUDY  07/05/2019   Procedure: BUBBLE STUDY;  Surgeon: Satira Sark, MD;  Location: [facility];  Service: Cardiovascular;;  . HERNIA REPAIR    . REPAIR OF PATENT FORAMEN OVALE N/A 08/09/2019   Procedure: Closure Of Patent Foramen Ovale;  Surgeon: Darylene Price  H, MD;  Location: [facility];  Service: Open Heart Surgery;  Laterality: N/A;  . RIGHT/LEFT HEART CATH AND CORONARY ANGIOGRAPHY N/A 07/26/2019   Procedure: RIGHT/LEFT HEART CATH AND CORONARY ANGIOGRAPHY;  Surgeon: Burnell Blanks, MD;  Location: [facility];  Service: Cardiovascular;  Laterality: N/A;  . SHOULDER SURGERY Left   . SHOULDER SURGERY Right   . TEE WITHOUT CARDIOVERSION N/A 07/05/2019   Procedure: TRANSESOPHAGEAL ECHOCARDIOGRAM (TEE) WITH PROPOFOL;  Surgeon: Satira Sark, MD;  Location:  [facility];  Service: Cardiovascular;  Laterality: N/A;  . TEE WITHOUT CARDIOVERSION N/A 08/09/2019   Procedure: TRANSESOPHAGEAL ECHOCARDIOGRAM (TEE);  Surgeon: Rexene Alberts, MD;  Location: [facility];  Service: Open Heart Surgery;  Laterality: N/A;  . Thumb surgery Left x3    Current Medications: Outpatient Medications Prior to Visit  Medication Sig Dispense Refill  . aspirin EC 325 MG EC tablet Take 1 tablet (325 mg total) by mouth daily. 30 tablet 0  . atorvastatin (LIPITOR) 10 MG tablet Take 10 mg by mouth every evening.     . Cholecalciferol (VITAMIN D3) 50 MCG (2000 UT) TABS Take 2,000 Units by mouth daily.    . Coenzyme Q10 (COQ10) 100 MG CAPS Take 100 mg by mouth daily.    Marland Kitchen ibuprofen (ADVIL) 200 MG tablet Take 400 mg by mouth every 8 (eight) hours as needed (pain.).    Marland Kitchen Multiple Vitamin (MULTIVITAMIN WITH MINERALS) TABS tablet Take 1 tablet by mouth daily.    . Omega-3 Fatty Acids (FISH OIL PO) Take 2,500 mg by mouth daily.    Marland Kitchen oxyCODONE (OXY IR/ROXICODONE) 5 MG immediate release tablet Take 1 tablet (5 mg total) by mouth every 6 (six) hours as needed for up to 7 days for severe pain. 30 tablet 0  . metoprolol tartrate (LOPRESSOR) 25 MG tablet Take 1 tablet (25 mg total) by mouth 2 (two) times daily. (Patient taking differently: Take 12.5 mg by mouth 2 (two) times daily. ) 60 tablet 2   No facility-administered medications prior to visit.     Allergies:   Ultram [tramadol hcl]   Social History   Socioeconomic History  . Marital status: Married    Spouse name: Donald [name]   . Number of children: 4  . Years of education: Not on file  . Highest education level: Some college, no degree  Occupational History  . Occupation: Nurse, adult: [employer]  Tobacco Use  . Smoking status: Former Smoker    Packs/day: 0.25    Types: Cigarettes    Quit date: 08/02/2019    Years since quitting: 0.0  . Smokeless tobacco: Never Used  . Tobacco comment: Cutting down < 5 cig  / day  Substance and Sexual Activity  . Alcohol use: Yes    Alcohol/week: 12.0 standard drinks    Types: 12 Shots of liquor per week    Comment: on Friday and Saturday  . Drug use: No  . Sexual activity: Yes    Birth control/protection: Surgical  Other Topics Concern  . Not on file  Social History Narrative   Live Donald [name] wife of 2 years, previously was widowed from first wife from cancer 01/2011      Four children      Enjoy: golf, target shooting       Diet:    Caffeine: 16-20 oz coffee    Water: 6-7 bottles       Wears seat belt    Smoke detectors at  home    Does not use phone while driving    Social Determinants of Health   Financial Resource Strain:   . Difficulty of Paying Living Expenses:   Food Insecurity:   . Worried About Charity fundraiser in the Last Year:   . Arboriculturist in the Last Year:   Transportation Needs:   . Film/video editor (Medical):   Marland Kitchen Lack of Transportation (Non-Medical):   Physical Activity:   . Days of Exercise per Week:   . Minutes of Exercise per Session:   Stress:   . Feeling of Stress :   Social Connections:   . Frequency of Communication with Friends and Family:   . Frequency of Social Gatherings with Friends and Family:   . Attends Religious Services:   . Active Member of Clubs or Organizations:   . Attends Archivist Meetings:   Marland Kitchen Marital Status:      Family History:  The patient's family history includes Other in his father.   Review of Systems:   Please see the history of present illness.     General:  No chills, fever, night sweats or weight changes.  Cardiovascular:  No dyspnea on exertion, edema, orthopnea, palpitations, paroxysmal nocturnal dyspnea. Positive for incisional chest pain.  Dermatological: No rash, lesions/masses Respiratory: No cough, dyspnea Urologic: No hematuria, dysuria Abdominal:   No nausea, vomiting, diarrhea, bright red blood per rectum, melena, or hematemesis Neurologic:  No  visual changes, wkns, changes in mental status. All other systems reviewed and are otherwise negative except as noted above.   Physical Exam:    VS:  BP 110/60   Pulse 80   Temp 98 F (36.7 C)   Ht 6\' 2"  (1.88 m)   Wt 196 lb 12.8 oz (89.3 kg)   BMI 25.27 kg/m    General: Well developed, well nourished,male appearing in no acute distress. Head: Normocephalic, atraumatic, sclera non-icteric.  Neck: No carotid bruits. JVD not elevated.  Lungs: Respirations regular and unlabored, without wheezes or rales.  Heart: Regular rate and rhythm with occasional ectopic beats. No S3 or S4.  No rubs or gallops appreciated. Soft flow murmur along RUSB. Incisions appear well-healing with no drainage present.  Abdomen: Soft, non-tender, non-distended. No obvious abdominal masses. Msk:  Strength and tone appear normal for age. No obvious joint deformities or effusions. Extremities: No clubbing or cyanosis. No edema.  Distal pedal pulses are 2+ bilaterally. Neuro: Alert and oriented X 3. Moves all extremities spontaneously. No focal deficits noted. Psych:  Responds to questions appropriately with a normal affect. Skin: No rashes or lesions noted  Wt Readings from Last 3 Encounters:  08/23/19 196 lb 12.8 oz (89.3 kg)  08/12/19 200 lb 9.9 oz (91 kg)  08/07/19 194 lb 9.6 oz (88.3 kg)     Studies/Labs Reviewed:   EKG: Rhythm strip was ordered today in the setting of his ectopy on examination which shows RBBB with T wave inversion along the inferior and lateral leads as noted on prior tracings.  He does have occasional PVC's.  Recent Labs: 08/10/2019: Magnesium 2.2 08/12/2019: ALT 31 08/13/2019: BUN 23; Creatinine, Ser 1.11; Hemoglobin 10.7; Platelets 118; Potassium 3.9; Sodium 139   Lipid Panel    Component Value Date/Time   CHOL 174 06/28/2019 0833   TRIG 375 (H) 06/28/2019 0833   HDL 53 06/28/2019 0833   CHOLHDL 3.3 06/28/2019 0833   LDLCALC 76 06/28/2019 0833    Additional studies/ records  that were reviewed today include:   Echocardiogram: 06/2019 IMPRESSIONS    1. Left ventricular ejection fraction, by estimation, is 30 to 35%. The  left ventricle has moderately decreased function. The left ventricle  demonstrates global hypokinesis. The left ventricular internal cavity size  was moderately to severely dilated.  There is moderate left ventricular hypertrophy. Left ventricular diastolic  parameters are indeterminate.  2. Right ventricular systolic function is normal. The right ventricular  size is normal.  3. Left atrial size was severely dilated.  4. The mitral valve is normal in structure and function. No evidence of  mitral valve regurgitation. No evidence of mitral stenosis.  5. Prolapse of the aortic valve non coronary cusp with resultant  eccentric regurgitation. The jet is eccentric and not able to measure vena  contracta. By PHT (right around 200) AI qualifies as moderate to severe.  Even though the jet is very eccentric by  color alone in several views looks to be severe. The Doppler of the  aortic arch is limited, cannot verify degree of diastolic flow reversal.  Given degree of LV dilatation and LV dysfunction consider TEE to better  quantify severity of AI and precise  anatomy of the aortic valve. . The aortic valve has an indeterminant  number of cusps. Aortic valve regurgitation is moderate to severe. No  aortic stenosis is present.  6. Aortic dilatation noted. There is mild dilatation of the aortic root  measuring 40 mm.  7. The inferior vena cava is normal in size with greater than 50%  respiratory variability, suggesting right atrial pressure of 3 mmHg.   Cardiac Catheterization: 07/2019  Prox RCA lesion is 20% stenosed.  Dist RCA lesion is 10% stenosed.  3rd Mrg lesion is 10% stenosed.  Prox LAD to Mid LAD lesion is 20% stenosed.  1st Diag lesion is 30% stenosed.   1. Mild non-obstructive CAD 2. Severe aortic valve insufficiency.    Recommendations: Continue planning for aortic valve repair.  Intraoperative TEE: [id number] Complications: No known complications during this procedure.  POST-OP IMPRESSIONS  - Left Ventricle: The left ventricle is unchanged from pre-bypass.  - Aorta: The aorta appears unchanged from pre-bypass.  - Left Atrial Appendage: The left atrial appendage appears unchanged from  pre-bypass.  - Aortic Valve: A bioprosthetic bioprosthetic valve was placed, leaflets  are  freely mobile and leaflets thin. Manufactured by; Oletta Lamas Size; 71mm. No  regurgitation post repair. The gradient recorded across the prosthetic  valve is  within the expected range, measuring 73.3 cm/s. No perivalvular leak  noted.  - Mitral Valve: The mitral valve appears unchanged from pre-bypass.  - Tricuspid Valve: The tricuspid valve appears unchanged from pre-bypass.  - Interatrial Septum: Post CPB, there is no flow seen by CFD at the  location of  the PFO.  - Pericardium: The pericardium appears unchanged from pre-bypass.   Assessment:    1. Severe aortic insufficiency   2. S/P aortic valve replacement with bioprosthetic valve   3. Secondary cardiomyopathy ([place])   4. Essential hypertension   5. Mixed hyperlipidemia      Plan:   In order of problems listed above:  1. Severe Aortic Regurgitation - he is s/p AVR with an Edwards Inspiris Resilia stented bovine pericardial tissue valve. He has overall done well since surgery and incisional pain continues to improve.  - will plan to obtain a follow-up echocardiogram once 6 weeks from out surgery to reassess valve function. Remains on ASA 325mg  daily.   2.  Secondary Cardiomyopathy - recent echocardiogram showed his EF was reduced at 30-35%. Will plan to obtain a repeat echocardiogram once 6 weeks out from valve replacement as recommended by CT Surgery.  - he appears euvolemic by examination and has not required diuretic therapy. He is currently on Lopressor 12.5mg   BID (recently reduced from 25mg  BID as outlined above). Will transition to Toprol-XL 25mg  daily in the setting of his cardiomyopathy. Hopefully this will help with his PVC's as well. I did recommend limiting caffeine and alcohol consumption. Pending BP trend, would attempt to add Losartan or Spironolactone. Doubt BP would allow for Entresto.   3. HTN - BP is well-controlled at 110/60 during today's visit but has been soft when checked at home. Will plan to switch Lopressor to Toprol-XL as outlined above in the setting of his cardiomyopathy. Recommended to avoid concentrated beet juice given his significant drop in BP with this yesterday.    4. HLD - FLP in 06/2019 showed total cholesterol of 174, HDL 53, LDL 375 and LDL 76. He remains on Atorvastatin 10mg  daily.    Medication Adjustments/Labs and Tests Ordered: Current medicines are reviewed at length with the patient today.  Concerns regarding medicines are outlined above.  Medication changes, Labs and Tests ordered today are listed in the Patient Instructions below. Patient Instructions  Medication Instructions:  Your physician has recommended you make the following change in your medication:   Stop taking Lopressor  Start Taking Toprol XL 25 mg Daily    *If you need a refill on your cardiac medications before your next appointment, please call your pharmacy*   Lab Work: NONE   If you have labs (blood work) drawn today and your tests are completely normal, you will receive your results only by: Marland Kitchen MyChart Message (if you have MyChart) OR . A paper copy in the mail If you have any lab test that is abnormal or we need to change your treatment, we will call you to review the results.   Testing/Procedures: Your physician has requested that you have an echocardiogram. Echocardiography is a painless test that uses sound waves to create images of your heart. It provides your doctor with information about the size and shape of your heart and  how well your heart's chambers and valves are working. This procedure takes approximately one hour. There are no restrictions for this procedure.     Follow-Up: At [facility], you and your health needs are our priority.  As part of our continuing mission to provide you with exceptional heart care, we have created designated Provider Care Teams.  These Care Teams include your primary Cardiologist (physician) and Advanced Practice Providers (APPs -  Physician Assistants and Nurse Practitioners) who all work together to provide you with the care you need, when you need it.  We recommend signing up for the patient portal called "MyChart".  Sign up information is provided on this After Visit Summary.  MyChart is used to connect with patients for Virtual Visits (Telemedicine).  Patients are able to view lab/test results, encounter notes, upcoming appointments, etc.  Non-urgent messages can be sent to your provider as well.   To learn more about what you can do with MyChart, go to [email or web address].    Your next appointment:   3 month(s)  The format for your next appointment:   In Person  Provider:   Rozann Lesches, MD   Other Instructions Thank you for choosing [facility]!       Signed,  Erma Heritage, PA-C  08/23/2019 7:48 PM    [facility]. [address] [place], [place] [address] Phone: [phone] Fax: [phone]

## 2019-08-23 NOTE — Patient Instructions (Signed)
Medication Instructions:  Your physician has recommended you make the following change in your medication:   Stop taking Lopressor  Start Taking Toprol XL 25 mg Daily    *If you need a refill on your cardiac medications before your next appointment, please call your pharmacy*   Lab Work: NONE   If you have labs (blood work) drawn today and your tests are completely normal, you will receive your results only by: Marland Kitchen MyChart Message (if you have MyChart) OR . A paper copy in the mail If you have any lab test that is abnormal or we need to change your treatment, we will call you to review the results.   Testing/Procedures: Your physician has requested that you have an echocardiogram. Echocardiography is a painless test that uses sound waves to create images of your heart. It provides your doctor with information about the size and shape of your heart and how well your heart's chambers and valves are working. This procedure takes approximately one hour. There are no restrictions for this procedure.     Follow-Up: At [facility], you and your health needs are our priority.  As part of our continuing mission to provide you with exceptional heart care, we have created designated Provider Care Teams.  These Care Teams include your primary Cardiologist (physician) and Advanced Practice Providers (APPs -  Physician Assistants and Nurse Practitioners) who all work together to provide you with the care you need, when you need it.  We recommend signing up for the patient portal called "MyChart".  Sign up information is provided on this After Visit Summary.  MyChart is used to connect with patients for Virtual Visits (Telemedicine).  Patients are able to view lab/test results, encounter notes, upcoming appointments, etc.  Non-urgent messages can be sent to your provider as well.   To learn more about what you can do with MyChart, go to [email or web address].    Your next appointment:   3  month(s)  The format for your next appointment:   In Person  Provider:   Rozann Lesches, MD   Other Instructions Thank you for choosing [facility]!

## 2019-08-29 ENCOUNTER — Telehealth: Payer: Self-pay | Admitting: Cardiology

## 2019-08-29 ENCOUNTER — Encounter: Payer: Self-pay | Admitting: *Deleted

## 2019-08-29 NOTE — Telephone Encounter (Signed)
Per pt phone call-- pt called stating for the past 5 days he's had a dull headache at the top of his head to the back of his neck, thinks it may be coming from the metoprolol succinate (TOPROL XL) 25 MG 24 hr tablet [id number]    Please give pt a call @ [phone]

## 2019-08-29 NOTE — Progress Notes (Signed)
Donald [name] is s/p AVR/CLOSURE OF PFO on 08/09/19 and discharged on 08/13/19.  He called today with c/o a dull constant headache at the base of his skull for about a week.  It is not associated with nausea or vomiting and does not move in location.Marland KitchenMarland KitchenNor does he have any visual or body weaknesses.  He saw cardiology on 08/23/19 and did not have it then apparently, as nothing was mentioned.  Metoprolol was changed to Toprol XL but he has not started that as yet.  He was wondering if it was due to the surgery.  I said patients often had neck, shoulder and upper back discomfort due to positioning and the stretching of their nerves during surgery.  This may have some bearing on the headaches he is having.  He will try his already prescribed Ibuprofen.  If no relief he may call cardiology to be sure it is not medication related.

## 2019-08-29 NOTE — Telephone Encounter (Signed)
    Please have him cut the medication in half and take Toprol-XL 12.5 mg daily to see if this helps with symptoms.  I would encourage him to keep a record of HR/BP as discussed during his office visit.   Signed, Erma Heritage, PA-C 08/29/2019, 12:21 PM Pager: [phone]

## 2019-08-30 ENCOUNTER — Other Ambulatory Visit: Payer: Self-pay | Admitting: Thoracic Surgery (Cardiothoracic Vascular Surgery)

## 2019-08-30 DIAGNOSIS — Z952 Presence of prosthetic heart valve: Secondary | ICD-10-CM

## 2019-08-30 MED ORDER — METOPROLOL SUCCINATE ER 25 MG PO TB24: 25.0000 mg | ORAL_TABLET | Freq: Every day | ORAL | 11 refills | Status: DC

## 2019-08-30 NOTE — Addendum Note (Signed)
Addended by: Levonne Hubert on: 08/30/2019 12:42 PM   Modules accepted: Orders

## 2019-08-30 NOTE — Telephone Encounter (Signed)
Pt will try reduced dose of toprol 12.5 mg qd and monitor BP/HR

## 2019-09-03 ENCOUNTER — Ambulatory Visit (INDEPENDENT_AMBULATORY_CARE_PROVIDER_SITE_OTHER): Payer: Self-pay | Admitting: Physician Assistant

## 2019-09-03 ENCOUNTER — Other Ambulatory Visit: Payer: Self-pay

## 2019-09-03 ENCOUNTER — Ambulatory Visit
Admission: RE | Admit: 2019-09-03 | Discharge: 2019-09-03 | Disposition: A | Discharge status: Home / Self Care | Payer: BC Managed Care – PPO | Source: Ambulatory Visit | Attending: Thoracic Surgery (Cardiothoracic Vascular Surgery) | Admitting: Thoracic Surgery (Cardiothoracic Vascular Surgery)

## 2019-09-03 VITALS — BP 124/69 | HR 48 | Temp 98.8°F | Resp 20 | Ht 74.0 in | Wt 191.0 lb

## 2019-09-03 DIAGNOSIS — Z952 Presence of prosthetic heart valve: Secondary | ICD-10-CM

## 2019-09-03 NOTE — Patient Instructions (Signed)
Continue to observe sternal precautions.  No change in medications.   F/U in 1 month.

## 2019-09-03 NOTE — Progress Notes (Signed)
  HPI:  Patient returns for routine postoperative follow-up having undergone tissue aortic valve replacement and closure of a PFO on 08/09/19 by Dr. Roxy Manns.  His post-op course was uccomplicated and he was discharged on the 4th post-op day.  Since hospital discharge the patient reports having a posterior headache for about 3 weeks that has not recurred since he received his 2nd COVID vaccination. He also reports feeling tired and occasionally dizzy.  His metoprolol has been reduced to 12.5mg  po bid by cardiology since his discharge and these symptoms have improved since then.      Current Outpatient Medications  Medication Sig Dispense Refill  . acetaminophen (TYLENOL) 325 MG tablet Take 650 mg by mouth every 6 (six) hours as needed for mild pain.    Marland Kitchen aspirin EC 325 MG EC tablet Take 1 tablet (325 mg total) by mouth daily. 30 tablet 0  . atorvastatin (LIPITOR) 10 MG tablet Take 10 mg by mouth every evening.     . Cholecalciferol (VITAMIN D3) 50 MCG (2000 UT) TABS Take 2,000 Units by mouth daily.    . Coenzyme Q10 (COQ10) 100 MG CAPS Take 100 mg by mouth daily.    . metoprolol succinate (TOPROL XL) 25 MG 24 hr tablet Take 1 tablet (25 mg total) by mouth daily. 30 tablet 11  . Multiple Vitamin (MULTIVITAMIN WITH MINERALS) TABS tablet Take 1 tablet by mouth daily.    . Omega-3 Fatty Acids (FISH OIL PO) Take 2,500 mg by mouth daily.    Marland Kitchen ibuprofen (ADVIL) 200 MG tablet Take 400 mg by mouth every 8 (eight) hours as needed (pain.).     No current facility-administered medications for this visit.    Physical Exam  VS:   HR  48 RR 20 BP  124/65 T 98.27f  Heart-regular rhythm with occasional skipped beat.   Chest- breath sounds are CTA throughout.  There sternotomy incision is healing with no sign of complication. The sternum is stable and there is expected tenderness along the costo-chondral cartilages.   Exts- no peripheral edema.     Diagnostic Tests:  CHEST - 2 VIEW  COMPARISON:   08/13/2019 and earlier.  FINDINGS: Improved lung volumes. Cardiac silhouette appears decreased. Stable prosthetic aortic valve. Other mediastinal contours are within normal limits. Visualized tracheal air column is within normal limits.  Both lungs appear clear.  No pneumothorax or pleural effusion.  No acute osseous abnormality identified. Negative visible bowel gas pattern.  IMPRESSION: Status post aortic valve replacement with decreased cardiac silhouette and no acute cardiopulmonary abnormality.   Electronically Signed   By: Genevie Ann M.D.   On: 09/03/2019 12:39    Impression / Plan:  Donald [name] is 3 1/2 weeks post tissue aortic valve replacement and closure of a PFO.  He is making satisfactory from a surgical standpoint.  The CXR shows significant decrease in the size of the cardiac silhouette. No further diuresis is necessary. Cardiology is managing his other medications.  He has stopped smoking completely.  Sternal precautions are reviewed. He is to f/u in 1 month   Antony Odea, PA-C [facility] [phone]

## 2019-09-10 ENCOUNTER — Encounter (HOSPITAL_COMMUNITY): Payer: BC Managed Care – PPO

## 2019-09-25 ENCOUNTER — Ambulatory Visit (HOSPITAL_COMMUNITY)
Admission: RE | Admit: 2019-09-25 | Discharge: 2019-09-25 | Disposition: A | Discharge status: Home / Self Care | Payer: BC Managed Care – PPO | Source: Ambulatory Visit | Attending: Student | Admitting: Student

## 2019-09-25 ENCOUNTER — Other Ambulatory Visit: Payer: Self-pay

## 2019-09-25 DIAGNOSIS — I351 Nonrheumatic aortic (valve) insufficiency: Secondary | ICD-10-CM | POA: Insufficient documentation

## 2019-09-25 NOTE — Progress Notes (Signed)
*  PRELIMINARY RESULTS* Echocardiogram 2D Echocardiogram has been performed.  Donald [name] 09/25/2019, 1:55 PM

## 2019-09-28 ENCOUNTER — Other Ambulatory Visit: Payer: Self-pay | Admitting: Physician Assistant

## 2019-10-01 ENCOUNTER — Encounter: Payer: Self-pay | Admitting: Thoracic Surgery (Cardiothoracic Vascular Surgery)

## 2019-10-01 ENCOUNTER — Other Ambulatory Visit: Payer: Self-pay

## 2019-10-01 ENCOUNTER — Ambulatory Visit (INDEPENDENT_AMBULATORY_CARE_PROVIDER_SITE_OTHER): Payer: Self-pay | Admitting: Thoracic Surgery (Cardiothoracic Vascular Surgery)

## 2019-10-01 VITALS — BP 137/96 | HR 48 | Temp 97.8°F | Resp 20 | Ht 74.0 in | Wt 198.0 lb

## 2019-10-01 DIAGNOSIS — Z953 Presence of xenogenic heart valve: Secondary | ICD-10-CM

## 2019-10-01 DIAGNOSIS — Z8774 Personal history of (corrected) congenital malformations of heart and circulatory system: Secondary | ICD-10-CM

## 2019-10-01 DIAGNOSIS — Z952 Presence of prosthetic heart valve: Secondary | ICD-10-CM

## 2019-10-01 NOTE — Patient Instructions (Signed)
Continue all previous medications without any changes at this time  Continue to avoid any heavy lifting or strenuous use of your arms or shoulders for at least a total of three months from the time of surgery.  After three months you may gradually increase how much you lift or otherwise use your arms or chest as tolerated, with limits based upon whether or not activities lead to the return of significant discomfort.  You may return to driving an automobile as long as you are no longer requiring oral narcotic pain relievers during the daytime.  It would be wise to start driving only short distances during the daylight and gradually increase from there as you feel comfortable.  Endocarditis is a potentially serious infection of heart valves or inside lining of the heart.  It occurs more commonly in patients with diseased heart valves (such as patient's with aortic or mitral valve disease) and in patients who have undergone heart valve repair or replacement.  Certain surgical and dental procedures may put you at risk, such as dental cleaning, other dental procedures, or any surgery involving the respiratory, urinary, gastrointestinal tract, gallbladder or prostate gland.   To minimize your chances for develooping endocarditis, maintain good oral health and seek prompt medical attention for any infections involving the mouth, teeth, gums, skin or urinary tract.    Always notify your doctor or dentist about your underlying heart valve condition before having any invasive procedures. You will need to take antibiotics before certain procedures, including all routine dental cleanings or other dental procedures.  Your cardiologist or dentist should prescribe these antibiotics for you to be taken ahead of time.

## 2019-10-01 NOTE — Progress Notes (Signed)
[name]       [place], [address]             [phone]     CARDIOTHORACIC SURGERY OFFICE NOTE  Primary Cardiologist is Rozann Lesches, MD PCP is Perlie Mayo, NP   HPI:  Patient is a 54 year old male who returns the office today for routine follow-up status post aortic valve replacement using a stented bovine pericardial tissue valve on August 09, 2019 for stage C 2 severe asymptomatic aortic insufficiency associated with moderate to severe global left ventricular systolic dysfunction and significant left ventricular chamber enlargement.  His postoperative recovery has been uneventful and he was last seen here in our office on Sep 03, 2019 at which time he was doing well.  Since then he recently underwent routine follow-up echocardiogram which revealed normal functioning bioprosthetic tissue valve in the aortic position and somewhat improved left ventricular systolic function with ejection fraction increased to 45 to 50%.  He returns to our office today and reports that he is doing very well.  He is quite active physically and exercising on a daily basis.  He has no shortness of breath.  He has mild residual soreness in his chest that continues to improve.  Appetite is good.  Overall he is pleased with his progress.   Current Outpatient Medications  Medication Sig Dispense Refill  . acetaminophen (TYLENOL) 325 MG tablet Take 650 mg by mouth every 6 (six) hours as needed for mild pain.    Marland Kitchen aspirin EC 325 MG EC tablet Take 1 tablet (325 mg total) by mouth daily. 30 tablet 0  . atorvastatin (LIPITOR) 10 MG tablet Take 10 mg by mouth every evening.     . Cholecalciferol (VITAMIN D3) 50 MCG (2000 UT) TABS Take 2,000 Units by mouth daily.    . Coenzyme Q10 (COQ10) 100 MG CAPS Take 100 mg by mouth daily.    Marland Kitchen ibuprofen (ADVIL) 200 MG tablet Take 400 mg by mouth every 8 (eight) hours as needed (pain.).    Marland Kitchen metoprolol succinate (TOPROL XL) 25 MG 24 hr tablet Take  1 tablet (25 mg total) by mouth daily. 30 tablet 11  . Multiple Vitamin (MULTIVITAMIN WITH MINERALS) TABS tablet Take 1 tablet by mouth daily.    . Omega-3 Fatty Acids (FISH OIL PO) Take 2,500 mg by mouth daily.     No current facility-administered medications for this visit.      Physical Exam:   BP (!) 137/96   Pulse (!) 48   Temp 97.8 F (36.6 C) (Skin)   Resp 20   Ht 6\' 2"  (1.88 m)   Wt 198 lb (89.8 kg) Comment: 198lba  SpO2 97% Comment: RA  BMI 25.42 kg/m   General:  Well-appearing  Chest:   Clear to auscultation  CV:   Regular rate and rhythm without murmur  Incisions:  Well-healed, sternum is stable  Abdomen:  Soft nontender  Extremities:  Warm and well-perfused  Diagnostic Tests:   ECHOCARDIOGRAM REPORT       Patient Name:  Donald [name] Date of Exam: 09/25/2019  Medical Rec #: [id number]   Height:    74.0 in  Accession #:  [id number]  Weight:    191.0 lb  Date of Birth: 1965-10-05  BSA:     2.131 m  Patient Age:  39 years   BP:      127/58 mmHg  Patient Gender: M  HR:      91 bpm.  Exam Location: [facility]   Procedure: 2D Echo, Cardiac Doppler and Color Doppler   Indications:  S/P Aortic Valve replacement Z95.2    History:    Patient has prior history of Echocardiogram examinations,  most         recent 08/09/2019. Arrythmias:RBBB; Risk  Factors:Hypertension and         Dyslipidemia. Left Ventricular Hypertrophy,Abnormal  EKG,S/P         aortic valve replacement with bioprosthetic valve-29 mm  Edwards         Inspiris Resilia stented bovine pericardial tissue  valve,S/P         patent foramen ovale closure.    Sonographer:  Alvino Chapel RCS  Referring Phys: [id number] [place]    1. Right ventricular systolic function is normal. The right ventricular  size is normal.  2. Anteroseptal hypokinesis consistent with  prior cardiac surgery. . Left  ventricular ejection fraction, by estimation, is 45 to 50%. The left  ventricle has mildly decreased function. The left ventricle demonstrates  regional wall motion abnormalities  (see scoring diagram/findings for description). There is moderate left  ventricular hypertrophy. Left ventricular diastolic parameters are  consistent with Grade I diastolic dysfunction (impaired relaxation).  3. The mitral valve is normal in structure. No evidence of mitral valve  regurgitation. No evidence of mitral stenosis.  4. Edwards Inspiris Resilia stented bovine pericardial tissue valve size  29 mm in AV position. Normal function. . The aortic valve has been  repaired/replaced. Aortic valve regurgitation is not visualized. No aortic  stenosis is present.  5. The inferior vena cava is normal in size with greater than 50%  respiratory variability, suggesting right atrial pressure of 3 mmHg.   FINDINGS  Left Ventricle: Anteroseptal hypokinesis. Left ventricular ejection  fraction, by estimation, is 45 to 50%. The left ventricle has mildly  decreased function. The left ventricle demonstrates regional wall motion  abnormalities. The left ventricular  internal cavity size was normal in size. There is moderate left  ventricular hypertrophy. Left ventricular diastolic parameters are  consistent with Grade I diastolic dysfunction (impaired relaxation).  Normal left ventricular filling pressure.   Right Ventricle: The right ventricular size is normal. No increase in  right ventricular wall thickness. Right ventricular systolic function is  normal.   Left Atrium: Left atrial size was normal in size.   Right Atrium: Right atrial size was normal in size.   Pericardium: There is no evidence of pericardial effusion.   Mitral Valve: The mitral valve is normal in structure. There is mild  thickening of the mitral valve leaflet(s). There is mild calcification of  the mitral  valve leaflet(s). Mild mitral annular calcification. No  evidence of mitral valve regurgitation. No  evidence of mitral valve stenosis.   Tricuspid Valve: The tricuspid valve is normal in structure. Tricuspid  valve regurgitation is not demonstrated. No evidence of tricuspid  stenosis.   Aortic Valve: Edwards Inspiris Resilia stented bovine pericardial tissue  valve size 29 mm in AV position. Normal function. The aortic valve has  been repaired/replaced. Aortic valve regurgitation is not visualized. No  aortic stenosis is present. Aortic  valve mean gradient measures 5.1 mmHg. Aortic valve peak gradient measures  9.2 mmHg. Aortic valve area, by VTI measures 1.71 cm.   Pulmonic Valve: The pulmonic valve was not well visualized. Pulmonic valve  regurgitation is trivial. No evidence of pulmonic stenosis.  Aorta: The aortic root is normal in size and structure.   Venous: The inferior vena cava is normal in size with greater than 50%  respiratory variability, suggesting right atrial pressure of 3 mmHg.   IAS/Shunts: No atrial level shunt detected by color flow Doppler.     LEFT VENTRICLE  PLAX 2D  LVIDd:     5.68 cm   Diastology  LVIDs:     4.86 cm   LV e' lateral:  7.29 cm/s  LV PW:     1.40 cm   LV E/e' lateral: 6.7  LV IVS:    1.40 cm   LV e' medial:  6.53 cm/s  LVOT diam:   2.00 cm   LV E/e' medial: 7.5  LV SV:     40  LV SV Index:  19  LVOT Area:   3.14 cm    LV Volumes (MOD)  LV vol d, MOD A2C: 115.0 ml  LV vol d, MOD A4C: 168.0 ml  LV vol s, MOD A2C: 53.1 ml  LV vol s, MOD A4C: 110.0 ml  LV SV MOD A2C:   61.9 ml  LV SV MOD A4C:   168.0 ml  LV SV MOD BP:   55.7 ml   RIGHT VENTRICLE  RV S prime:   12.10 cm/s  TAPSE (M-mode): 1.6 cm   LEFT ATRIUM       Index    RIGHT ATRIUM      Index  LA diam:    4.20 cm 1.97 cm/m RA Area:   14.90 cm  LA Vol (A2C):  64.1 ml 30.07 ml/m RA Volume:   33.00 ml 15.48 ml/m  LA Vol (A4C):  59.0 ml 27.68 ml/m  LA Biplane Vol: 64.7 ml 30.36 ml/m  AORTIC VALVE  AV Area (Vmax):  1.57 cm  AV Area (Vmean):  1.53 cm  AV Area (VTI):   1.71 cm  AV Vmax:      152.04 cm/s  AV Vmean:     107.591 cm/s  AV VTI:      0.235 m  AV Peak Grad:   9.2 mmHg  AV Mean Grad:   5.1 mmHg  LVOT Vmax:     75.75 cm/s  LVOT Vmean:    52.550 cm/s  LVOT VTI:     0.128 m  LVOT/AV VTI ratio: 0.54    AORTA  Ao Root diam: 3.90 cm   MITRAL VALVE  MV Area (PHT): 3.24 cm  SHUNTS  MV Decel Time: 234 msec  Systemic VTI: 0.13 m  MV E velocity: 48.70 cm/s Systemic Diam: 2.00 cm  MV A velocity: 75.10 cm/s  MV E/A ratio: 0.65   Carlyle Dolly MD  Electronically signed by Carlyle Dolly MD  Signature Date/Time: 09/25/2019/3:18:58 PM      Impression:  Patient is doing very well nearly 8 weeks status post aortic valve replacement using bioprosthetic tissue valve  Plan:  We have not recommended any change the patient's current medications.  I have encouraged the patient to continue to gradually increase his physical activity as tolerated with his only limitation remaining that he refrain from heavy lifting or strenuous use of his arms or shoulders for at least another month.  By mid July he should be able to return to work.  The patient has been reminded regarding the importance of dental hygiene and the lifelong need for antibiotic prophylaxis for all dental cleanings and other related invasive procedures.  The patient will continue to follow-up intermittently with  Dr. [name].  He will return for routine follow-up to our office next April, approximately 1 year following his surgery.  He will call or return sooner should specific problems or questions arise.    Valentina Gu. Roxy Manns, MD 10/01/2019 3:34 PM

## 2019-10-03 ENCOUNTER — Encounter: Payer: Self-pay | Admitting: Family Medicine

## 2019-10-03 ENCOUNTER — Other Ambulatory Visit: Payer: Self-pay

## 2019-10-03 ENCOUNTER — Ambulatory Visit (INDEPENDENT_AMBULATORY_CARE_PROVIDER_SITE_OTHER): Payer: BC Managed Care – PPO | Admitting: Family Medicine

## 2019-10-03 VITALS — BP 128/72 | HR 88 | Temp 97.1°F | Ht 74.0 in | Wt 197.1 lb

## 2019-10-03 DIAGNOSIS — M79671 Pain in right foot: Secondary | ICD-10-CM

## 2019-10-03 DIAGNOSIS — M109 Gout, unspecified: Secondary | ICD-10-CM

## 2019-10-03 DIAGNOSIS — M1A0790 Idiopathic chronic gout, unspecified ankle and foot, without tophus (tophi): Secondary | ICD-10-CM | POA: Insufficient documentation

## 2019-10-03 DIAGNOSIS — M79672 Pain in left foot: Secondary | ICD-10-CM

## 2019-10-03 DIAGNOSIS — I10 Essential (primary) hypertension: Secondary | ICD-10-CM | POA: Diagnosis not present

## 2019-10-03 DIAGNOSIS — Z8739 Personal history of other diseases of the musculoskeletal system and connective tissue: Secondary | ICD-10-CM | POA: Diagnosis not present

## 2019-10-03 HISTORY — DX: Gout, unspecified: M10.9

## 2019-10-03 MED ORDER — ALLOPURINOL 100 MG PO TABS: 100.0000 mg | ORAL_TABLET | Freq: Every day | ORAL | 6 refills | Status: DC

## 2019-10-03 MED ORDER — METHYLPREDNISOLONE 4 MG PO TBPK: ORAL_TABLET | ORAL | 0 refills | Status: DC

## 2019-10-03 NOTE — Assessment & Plan Note (Signed)
Uric acid level drawn as he reports she had gout flareups over the last several months intermittently.  Additionally will start allopurinol and do a short course of prednisone to help knock the gout down and get allopurinol chance to get the system. Patient acknowledged agreement and understanding of the plan.

## 2019-10-03 NOTE — Patient Instructions (Addendum)
I appreciate the opportunity to provide you with care for your health and wellness. Today we discussed: gout  Follow up: Oct-Nov for annual-come fasting labs that day   Labs today  Referrals today to podiatry   Take medication as directed, call if you have questions  So glad you are doing well!   Please continue to practice social distancing to keep you, your family, and our community safe.  If you must go out, please wear a mask and practice good handwashing.  It was a pleasure to see you and I look forward to continuing to work together on your health and well-being. Please do not hesitate to call the office if you need care or have questions about your care.  Have a wonderful day and week. With Gratitude, Cherly Beach, DNP, AGNP-BC

## 2019-10-03 NOTE — Progress Notes (Signed)
Subjective:  Patient ID: Donald [name], male    DOB: 13-Aug-1965  Age: 54 y.o. MRN: [id number]  CC:  Chief Complaint  Patient presents with  . Follow-up    1 month follow up had open heart surgery 2 months ago everything is fine   . Gout    left foot flare up yesterday pain at 4      HPI  HPI  Donald [name] is a 54 year old male patient of mine.  Who follows up today had open heart surgery 2 months ago-status post aortic valve replacement with bioprosthetic valve, patent foramen ovale closure, AVR.Marland Kitchen  Everything is doing very well with this.  He is seeing Dr. Roxy Manns and has been cleared for 1 year follow-up.  Today he has no concerns except for he needs a referral back to the podiatry as he was having to postpone that secondary to the heart surgery he had.  And additionally he has a gout flareup on his left metatarsal foot area.  He reports that this is the common area that he does get gout.  He denies having any injury or trauma to the site.  It is tender to palpation and touch.  No numbness or tingling.  Reports that he has been trying to follow a diet and low uric acid levels.  Is willing to have uric acid level and updated labs drawn today.  Denies having any chest pain, fever, chills, cough, shortness of breath, palpitations, leg swelling, headaches, dizziness or vision changes.  Today patient denies signs and symptoms of COVID 19 infection including fever, chills, cough, shortness of breath, and headache. Past Medical, Surgical, Social History, Allergies, and Medications have been Reviewed.   Past Medical History:  Diagnosis Date  . Bone tumor 11/22/2013  . Cardiomyopathy ([place])    a. EF 30 to 35% by echo in 06/2019 in the setting of severe AI  . Encounter for screening for malignant neoplasm of colon 06/27/2019  . Glenoid labral tear 10/27/2010  . Hyperlipidemia   . Rotator cuff tear, right 10/27/2010  . S/P aortic valve replacement with bioprosthetic valve 08/09/2019   29 mm Edwards  Inspiris Resilia stented bovine pericardial tissue valve  . S/P patent foramen ovale closure 08/09/2019  . Shoulder injury 10/27/2010    Current Meds  Medication Sig  . acetaminophen (TYLENOL) 325 MG tablet Take 650 mg by mouth every 6 (six) hours as needed for mild pain.  Marland Kitchen aspirin EC 325 MG EC tablet Take 1 tablet (325 mg total) by mouth daily.  Marland Kitchen atorvastatin (LIPITOR) 10 MG tablet Take 10 mg by mouth every evening.   . Cholecalciferol (VITAMIN D3) 50 MCG (2000 UT) TABS Take 2,000 Units by mouth daily.  . Coenzyme Q10 (COQ10) 100 MG CAPS Take 100 mg by mouth daily.  Marland Kitchen ibuprofen (ADVIL) 200 MG tablet Take 400 mg by mouth every 8 (eight) hours as needed (pain.).  Marland Kitchen metoprolol succinate (TOPROL XL) 25 MG 24 hr tablet Take 1 tablet (25 mg total) by mouth daily.  . Multiple Vitamin (MULTIVITAMIN WITH MINERALS) TABS tablet Take 1 tablet by mouth daily.  . Omega-3 Fatty Acids (FISH OIL PO) Take 2,500 mg by mouth daily.    ROS:  Review of Systems  HENT: Negative.   Eyes: Negative.   Respiratory: Negative.   Cardiovascular: Negative.   Gastrointestinal: Negative.   Genitourinary: Negative.   Musculoskeletal: Negative.        Gout and foot left  Skin: Negative.  Neurological: Negative.   Endo/Heme/Allergies: Negative.   Psychiatric/Behavioral: Negative.   All other systems reviewed and are negative.    Objective:   Today's Vitals: BP 128/72 (BP Location: Right Arm, Patient Position: Sitting, Cuff Size: Normal)   Pulse 88   Temp (!) 97.1 F (36.2 C) (Temporal)   Ht 6\' 2"  (1.88 m)   Wt 197 lb 1.9 oz (89.4 kg)   SpO2 96%   BMI 25.31 kg/m  Vitals with BMI 10/03/2019 10/01/2019 09/03/2019  Height 6\' 2"  6\' 2"  6\' 2"   Weight 197 lbs 2 oz 198 lbs 191 lbs  BMI 25.3 41.93 79.02  Systolic 409 735 329  Diastolic 72 96 69  Pulse 88 48 48     Physical Exam Vitals and nursing note reviewed.  Constitutional:      Appearance: Normal appearance. He is obese.  HENT:     Head: Normocephalic  and atraumatic.     Right Ear: External ear normal.     Left Ear: External ear normal.     Mouth/Throat:     Comments: Mask in place Eyes:     General:        Right eye: No discharge.        Left eye: No discharge.     Conjunctiva/sclera: Conjunctivae normal.  Cardiovascular:     Rate and Rhythm: Normal rate and regular rhythm.     Pulses: Normal pulses.     Heart sounds: Normal heart sounds.  Pulmonary:     Effort: Pulmonary effort is normal.     Breath sounds: Normal breath sounds.  Musculoskeletal:     Cervical back: Normal range of motion and neck supple.  Feet:     Comments: Noted redness and tenderness to palpitation over the metatarsals of the left foot.  No ulceration blistering or rash noted.  Sensation is present.  Pulses are present. Skin:    General: Skin is warm.  Neurological:     General: No focal deficit present.     Mental Status: He is alert and oriented to person, place, and time.  Psychiatric:        Mood and Affect: Mood normal.        Behavior: Behavior normal.        Thought Content: Thought content normal.        Judgment: Judgment normal.      Assessment   1. Essential hypertension   2. Acute gout of left foot, unspecified cause   3. Heel pain, bilateral     Tests ordered Orders Placed This Encounter  Procedures  . CBC  . Uric acid  . COMPLETE METABOLIC PANEL WITH GFR  . Ambulatory referral to Podiatry     Plan: Please see assessment and plan per problem list above.   Meds ordered this encounter  Medications  . allopurinol (ZYLOPRIM) 100 MG tablet    Sig: Take 1 tablet (100 mg total) by mouth daily.    Dispense:  30 tablet    Refill:  6    Order Specific Question:   Supervising Provider    Answer:   SIMPSON, MARGARET E [[id number]]  . methylPREDNISolone (MEDROL DOSEPAK) 4 MG TBPK tablet    Sig: Take as directed    Dispense:  21 tablet    Refill:  0    Order Specific Question:   Supervising Provider    Answer:   Jacklynn Bue    Patient to follow-up in 02/20/2020   Barbee Cough  Jerelene Redden, NP

## 2019-10-03 NOTE — Assessment & Plan Note (Signed)
Podiatry referral ordered.  Bilateral heel pain.  Had to postpone his referral secondary to heart surgery.  Possibly neuropathic in nature versus plantar fasciitis.

## 2019-10-03 NOTE — Assessment & Plan Note (Signed)
Donald [name] is encouraged to maintain a well balanced diet that is low in salt. Controlled, continue current medication regimen.  Additionally, he is also reminded that exercise is beneficial for heart health and control of  Blood pressure. 30-60 minutes daily is recommended-walking was suggested.

## 2019-10-04 ENCOUNTER — Ambulatory Visit: Payer: BC Managed Care – PPO | Admitting: Family Medicine

## 2019-10-04 LAB — COMPLETE METABOLIC PANEL WITH GFR
AG Ratio: 1.6 (calc) (ref 1.0–2.5)
ALT: 17 U/L (ref 9–46)
AST: 14 U/L (ref 10–35)
Albumin: 4.2 g/dL (ref 3.6–5.1)
Alkaline phosphatase (APISO): 68 U/L (ref 35–144)
BUN: 14 mg/dL (ref 7–25)
CO2: 26 mmol/L (ref 20–32)
Calcium: 9.5 mg/dL (ref 8.6–10.3)
Chloride: 105 mmol/L (ref 98–110)
Creat: 0.78 mg/dL (ref 0.70–1.33)
GFR, Est African American: 120 mL/min/{1.73_m2} (ref >=60)
GFR, Est Non African American: 104 mL/min/{1.73_m2} (ref >=60)
Globulin: 2.6 g/dL (calc) (ref 1.9–3.7)
Glucose, Bld: 88 mg/dL (ref 65–139)
Potassium: 4.9 mmol/L (ref 3.5–5.3)
Sodium: 139 mmol/L (ref 135–146)
Total Bilirubin: 0.6 mg/dL (ref 0.2–1.2)
Total Protein: 6.8 g/dL (ref 6.1–8.1)

## 2019-10-04 LAB — CBC
HCT: 43.3 % (ref 38.5–50.0)
Hemoglobin: 14.8 g/dL (ref 13.2–17.1)
MCH: 31.3 pg (ref 27.0–33.0)
MCHC: 34.2 g/dL (ref 32.0–36.0)
MCV: 91.5 fL (ref 80.0–100.0)
MPV: 10.5 fL (ref 7.5–12.5)
Platelets: 207 10*3/uL (ref 140–400)
RBC: 4.73 10*6/uL (ref 4.20–5.80)
RDW: 13 % (ref 11.0–15.0)
WBC: 6.3 10*3/uL (ref 3.8–10.8)

## 2019-10-04 LAB — URIC ACID: Uric Acid, Serum: 8.9 mg/dL — ABNORMAL HIGH (ref 4.0–8.0)

## 2019-10-22 ENCOUNTER — Ambulatory Visit: Payer: BC Managed Care – PPO | Admitting: Podiatry

## 2019-10-31 ENCOUNTER — Encounter (INDEPENDENT_AMBULATORY_CARE_PROVIDER_SITE_OTHER): Payer: Self-pay | Admitting: *Deleted

## 2019-10-31 ENCOUNTER — Encounter: Payer: Self-pay | Admitting: Cardiology

## 2019-10-31 ENCOUNTER — Ambulatory Visit (INDEPENDENT_AMBULATORY_CARE_PROVIDER_SITE_OTHER): Payer: BC Managed Care – PPO | Admitting: Cardiology

## 2019-10-31 ENCOUNTER — Other Ambulatory Visit: Payer: Self-pay

## 2019-10-31 ENCOUNTER — Telehealth: Payer: Self-pay

## 2019-10-31 VITALS — BP 124/84 | HR 62 | Ht 74.0 in | Wt 201.0 lb

## 2019-10-31 DIAGNOSIS — I10 Essential (primary) hypertension: Secondary | ICD-10-CM | POA: Diagnosis not present

## 2019-10-31 DIAGNOSIS — E782 Mixed hyperlipidemia: Secondary | ICD-10-CM

## 2019-10-31 DIAGNOSIS — Z953 Presence of xenogenic heart valve: Secondary | ICD-10-CM | POA: Diagnosis not present

## 2019-10-31 MED ORDER — METOPROLOL SUCCINATE ER 25 MG PO TB24: 12.5000 mg | ORAL_TABLET | Freq: Every day | ORAL | 11 refills | Status: DC

## 2019-10-31 NOTE — Telephone Encounter (Signed)
Preferred Times: Any  Reason: To address the following health maintenance concerns. Colonoscopy  Comments: Screening due   Please advise if this is ok --and I will place the referral

## 2019-10-31 NOTE — Telephone Encounter (Signed)
There is a referral from March to Dr Laural Golden, I have sent Lacretia Nicks a msg to see if the pt can be scheduled.

## 2019-10-31 NOTE — Patient Instructions (Addendum)
Medication Instructions:   DECREASE Toprol to 12.5 mg daily   *If you need a refill on your cardiac medications before your next appointment, please call your pharmacy*   Lab Work: None today  If you have labs (blood work) drawn today and your tests are completely normal, you will receive your results only by: Marland Kitchen MyChart Message (if you have MyChart) OR . A paper copy in the mail If you have any lab test that is abnormal or we need to change your treatment, we will call you to review the results.   Testing/Procedures:  None today  Follow-Up: At [facility], you and your health needs are our priority.  As part of our continuing mission to provide you with exceptional heart care, we have created designated Provider Care Teams.  These Care Teams include your primary Cardiologist (physician) and Advanced Practice Providers (APPs -  Physician Assistants and Nurse Practitioners) who all work together to provide you with the care you need, when you need it.  We recommend signing up for the patient portal called "MyChart".  Sign up information is provided on this After Visit Summary.  MyChart is used to connect with patients for Virtual Visits (Telemedicine).  Patients are able to view lab/test results, encounter notes, upcoming appointments, etc.  Non-urgent messages can be sent to your provider as well.   To learn more about what you can do with MyChart, go to [email or web address].    Your next appointment:   6 month(s)  The format for your next appointment:   In Person  Provider:   Rozann Lesches, MD   Other Instructions None       Thank you for choosing Donald [name] !

## 2019-10-31 NOTE — Telephone Encounter (Signed)
Referral can be placed Ok with Jarrett Soho

## 2019-10-31 NOTE — Progress Notes (Signed)
Cardiology Office Note  Date: 10/31/2019   ID: Donald [name], DOB Oct 04, 1965, MRN [id number]  PCP:  Perlie Mayo, NP  Cardiologist:  Rozann Lesches, MD Electrophysiologist:  None   Chief Complaint  Patient presents with  . Cardiac follow-up    History of Present Illness: Donald [name] is a 54 y.o. male last seen in April by Ms. Strader PA-C.  He presents for a routine visit.  States that he has been doing well overall, improved stamina, no exertional chest pain or unusual shortness of breath.  Recent follow-up echocardiogram in June indicated improvement in LVEF in the range of 45 to 50%, also normal bioprosthetic AVR function.  Overall reassuring findings.  I reviewed his medications which are outlined below.  He did ask me about his metoprolol, wondering whether it could have been leading to any headaches he has been experiencing.  He reports a sudden feeling of a pain and also nausea, almost like a hangover, at the base of his skull, not precipitated by motion, spontaneous and goes away just as quickly.  I do see that he has a history of degenerative disc disease in his cervical spine by previous imaging.  He has chronic paresthesias in his left hand.  These symptoms are not worse when he experiences the headache however.  Past Medical History:  Diagnosis Date  . Bone tumor 11/22/2013  . Cardiomyopathy ([place])    a. EF 30 to 35% by echo in 06/2019 in the setting of severe AI  . Encounter for screening for malignant neoplasm of colon 06/27/2019  . Glenoid labral tear 10/27/2010  . Hyperlipidemia   . Rotator cuff tear, right 10/27/2010  . S/P aortic valve replacement with bioprosthetic valve 08/09/2019   29 mm Edwards Inspiris Resilia stented bovine pericardial tissue valve  . S/P patent foramen ovale closure 08/09/2019  . Shoulder injury 10/27/2010    Past Surgical History:  Procedure Laterality Date  . AORTIC VALVE REPLACEMENT N/A 08/09/2019   Procedure: AORTIC VALVE REPLACEMENT  (AVR) using Margaretha Sheffield Resilia 29 MM Aortic Valve.;  Surgeon: Rexene Alberts, MD;  Location: [facility];  Service: Open Heart Surgery;  Laterality: N/A;  . BACK SURGERY     multiple back surgery  . Benign tumor     Left shin  . BUBBLE STUDY  07/05/2019   Procedure: BUBBLE STUDY;  Surgeon: Satira Sark, MD;  Location: [facility];  Service: Cardiovascular;;  . HERNIA REPAIR    . REPAIR OF PATENT FORAMEN OVALE N/A 08/09/2019   Procedure: Closure Of Patent Foramen Ovale;  Surgeon: Rexene Alberts, MD;  Location: [facility];  Service: Open Heart Surgery;  Laterality: N/A;  . RIGHT/LEFT HEART CATH AND CORONARY ANGIOGRAPHY N/A 07/26/2019   Procedure: RIGHT/LEFT HEART CATH AND CORONARY ANGIOGRAPHY;  Surgeon: Burnell Blanks, MD;  Location: [facility];  Service: Cardiovascular;  Laterality: N/A;  . SHOULDER SURGERY Left   . SHOULDER SURGERY Right   . TEE WITHOUT CARDIOVERSION N/A 07/05/2019   Procedure: TRANSESOPHAGEAL ECHOCARDIOGRAM (TEE) WITH PROPOFOL;  Surgeon: Satira Sark, MD;  Location: [facility];  Service: Cardiovascular;  Laterality: N/A;  . TEE WITHOUT CARDIOVERSION N/A 08/09/2019   Procedure: TRANSESOPHAGEAL ECHOCARDIOGRAM (TEE);  Surgeon: Rexene Alberts, MD;  Location: [facility];  Service: Open Heart Surgery;  Laterality: N/A;  . Thumb surgery Left x3    Current Outpatient Medications  Medication Sig Dispense Refill  . acetaminophen (TYLENOL) 325 MG tablet Take 650 mg by  mouth every 6 (six) hours as needed for mild pain.    Marland Kitchen allopurinol (ZYLOPRIM) 100 MG tablet Take 1 tablet (100 mg total) by mouth daily. 30 tablet 6  . aspirin EC 325 MG EC tablet Take 1 tablet (325 mg total) by mouth daily. 30 tablet 0  . atorvastatin (LIPITOR) 10 MG tablet Take 10 mg by mouth every evening.     . Cholecalciferol (VITAMIN D3) 50 MCG (2000 UT) TABS Take 2,000 Units by mouth daily.    . Coenzyme Q10 (COQ10) 100 MG CAPS Take 100 mg by mouth daily.    Marland Kitchen ibuprofen (ADVIL) 200 MG tablet Take  400 mg by mouth every 8 (eight) hours as needed (pain.).    Marland Kitchen metoprolol succinate (TOPROL XL) 25 MG 24 hr tablet Take 0.5 tablets (12.5 mg total) by mouth daily. 30 tablet 11  . Multiple Vitamin (MULTIVITAMIN WITH MINERALS) TABS tablet Take 1 tablet by mouth daily.    . Omega-3 Fatty Acids (FISH OIL PO) Take 2,500 mg by mouth daily.     No current facility-administered medications for this visit.   Allergies:  Ultram [tramadol hcl]   ROS:   Focal intermittent headache at the base of his skull as outlined above.  No syncope.  Physical Exam: VS:  BP 124/84   Pulse 62   Ht 6\' 2"  (1.88 m)   Wt 201 lb (91.2 kg)   SpO2 94%   BMI 25.81 kg/m , BMI Body mass index is 25.81 kg/m.  Wt Readings from Last 3 Encounters:  10/31/19 201 lb (91.2 kg)  10/03/19 197 lb 1.9 oz (89.4 kg)  10/01/19 198 lb (89.8 kg)    General: Patient appears comfortable at rest. HEENT: Conjunctiva and lids normal, wearing a mask. Neck: Supple, no elevated JVP or carotid bruits, no thyromegaly. Lungs: Clear to auscultation, nonlabored breathing at rest. Cardiac: Regular rate and rhythm, no S3, soft systolic murmur, no diastolic murmur or rub. Extremities: No pitting edema, distal pulses 2+.  ECG:  An ECG dated 08/10/2019 was personally reviewed today and demonstrated:  Sinus rhythm with LVH, incomplete right bundle branch block, repolarization abnormalities.  Recent Labwork: 08/10/2019: Magnesium 2.2 10/03/2019: ALT 17; AST 14; BUN 14; Creat 0.78; Hemoglobin 14.8; Platelets 207; Potassium 4.9; Sodium 139     Component Value Date/Time   CHOL 174 06/28/2019 0833   TRIG 375 (H) 06/28/2019 0833   HDL 53 06/28/2019 0833   CHOLHDL 3.3 06/28/2019 0833   LDLCALC 76 06/28/2019 0833    Other Studies Reviewed Today:  Cardiac catheterization 07/26/2019:  Prox RCA lesion is 20% stenosed.  Dist RCA lesion is 10% stenosed.  3rd Mrg lesion is 10% stenosed.  Prox LAD to Mid LAD lesion is 20% stenosed.  1st Diag lesion  is 30% stenosed.   1. Mild non-obstructive CAD 2. Severe aortic valve insufficiency.   Echocardiogram 09/25/2019: 1. Right ventricular systolic function is normal. The right ventricular  size is normal.  2. Anteroseptal hypokinesis consistent with prior cardiac surgery. . Left  ventricular ejection fraction, by estimation, is 45 to 50%. The left  ventricle has mildly decreased function. The left ventricle demonstrates  regional wall motion abnormalities  (see scoring diagram/findings for description). There is moderate left  ventricular hypertrophy. Left ventricular diastolic parameters are  consistent with Grade I diastolic dysfunction (impaired relaxation).  3. The mitral valve is normal in structure. No evidence of mitral valve  regurgitation. No evidence of mitral stenosis.  4. Edwards Inspiris Resilia stented bovine  pericardial tissue valve size  29 mm in AV position. Normal function. . The aortic valve has been  repaired/replaced. Aortic valve regurgitation is not visualized. No aortic  stenosis is present.  5. The inferior vena cava is normal in size with greater than 50%  respiratory variability, suggesting right atrial pressure of 3 mmHg.   Assessment and Plan:  1.  History of severe aortic regurgitation with nonischemic cardiomyopathy now status post surgical repair with a 29 mm Edwards Inspiris Resilia stented bovine pericardial tissue valve by Dr. Roxy Manns in April.  He is doing well in terms of recovery overall, follow-up echocardiogram showed improvement in LVEF to the range of 45 to 50% and normal bioprosthetic valve function.  We will continue with observation at this point.  Toprol-XL being reduced to 12.5 mg daily.  If he continues to experience headaches he will let us know.  2.  Essential hypertension, blood pressure is well controlled today.  Continue observation.  3.  Mixed hyperlipidemia with mild atherosclerosis.  Continue statin therapy.  Medication  Adjustments/Labs and Tests Ordered: Current medicines are reviewed at length with the patient today.  Concerns regarding medicines are outlined above.   Tests Ordered: No orders of the defined types were placed in this encounter.   Medication Changes: Meds ordered this encounter  Medications  . metoprolol succinate (TOPROL XL) 25 MG 24 hr tablet    Sig: Take 0.5 tablets (12.5 mg total) by mouth daily.    Dispense:  30 tablet    Refill:  11    10/31/19 dose reduced to 12.5 mg qd    Disposition:  Follow up 6 months in the [place] office.  Signed, Satira Sark, MD, [facility] 10/31/2019 1:32 PM    [facility] [address] [place], [place] [address] Phone: [phone]; Fax: [phone]

## 2019-11-02 ENCOUNTER — Encounter (HOSPITAL_COMMUNITY)
Admission: RE | Admit: 2019-11-02 | Discharge: 2019-11-02 | Disposition: A | Discharge status: Home / Self Care | Payer: BC Managed Care – PPO | Source: Ambulatory Visit | Attending: Cardiology | Admitting: Cardiology

## 2019-11-02 ENCOUNTER — Encounter: Payer: Self-pay | Admitting: *Deleted

## 2019-11-02 ENCOUNTER — Telehealth: Payer: Self-pay | Admitting: Cardiology

## 2019-11-02 ENCOUNTER — Encounter (HOSPITAL_COMMUNITY): Payer: Self-pay

## 2019-11-02 ENCOUNTER — Other Ambulatory Visit: Payer: Self-pay

## 2019-11-02 VITALS — BP 110/80 | HR 96 | Ht 74.0 in | Wt 200.4 lb

## 2019-11-02 DIAGNOSIS — Z952 Presence of prosthetic heart valve: Secondary | ICD-10-CM | POA: Insufficient documentation

## 2019-11-02 NOTE — Telephone Encounter (Signed)
Pt need return to work note faxed to [employer] [facility] [employer] [phone] He has a note from Dr. Ricard Dillon dated July 12th, however his employer is requesting a note from [place] as well.  Call if needed [phone]   Thanks renee

## 2019-11-02 NOTE — Telephone Encounter (Signed)
Letter faxed.

## 2019-11-02 NOTE — Progress Notes (Signed)
Daily Session Note  Patient Details  Name: Donald [name] MRN: [id number] Date of Birth: [birth year] Referring Provider:     CARDIAC REHAB PHASE II ORIENTATION from 11/02/2019 in [place] [place]  Referring Provider Donald [name]      Encounter Date: 11/02/2019  Check In:  Session Check In - 11/02/19 1230      Check-In   Supervising physician immediately available to respond to emergencies See telemetry face sheet for immediately available ER MD    Location [facility]    Staff Present Donald [name], Exercise Physiologist;Donald [name] Donald Emery, RN, BSN;Donald Billingsley, RN    Virtual Visit No    Medication changes reported     No    Fall or balance concerns reported    No    Tobacco Cessation --   Patient has cut back since 08/02/19. He is currently smoking 5 cigarettes/day. He is not using any help to quit.   Current number of cigarettes/nicotine per day     5    Warm-up and Cool-down --   Performed as individual.   Resistance Training Performed Yes    VAD Patient? No    PAD/SET Patient? No      Pain Assessment   Currently in Pain? No/denies    Multiple Pain Sites No           Capillary Blood Glucose: No results found for this or any previous visit (from the past 24 hour(s)).    Social History   Tobacco Use  Smoking Status Former Smoker  . Packs/day: 0.25  . Types: Cigarettes  . Quit date: 08/02/2019  . Years since quitting: 0.2  Smokeless Tobacco Never Used  Tobacco Comment   Cutting down < 5 cig / day    Goals Met:  No report of cardiac concerns or symptoms Strength training completed today  Goals Unmet:  Not Applicable  Comments: Patient's orientation visit today. Check at 14:15.   Dr. Kathie [name] is Medical Director for [facility].

## 2019-11-02 NOTE — Progress Notes (Signed)
Cardiac Individual Treatment Plan  Patient Details  Name: Donald [name] MRN: [id number] Date of Birth: Sep 19, 1965 Referring Provider:     CARDIAC REHAB PHASE II ORIENTATION from 11/02/2019 in [place]  Referring Provider [name]      Initial Encounter Date:    CARDIAC REHAB PHASE II ORIENTATION from 11/02/2019 in [place]  Date 11/02/19      Visit Diagnosis: S/P aortic valve replacement  Patient's Home Medications on Admission:  Current Outpatient Medications:  .  aspirin EC 325 MG EC tablet, Take 1 tablet (325 mg total) by mouth daily., Disp: 30 tablet, Rfl: 0 .  atorvastatin (LIPITOR) 10 MG tablet, Take 10 mg by mouth every evening. , Disp: , Rfl:  .  Cholecalciferol (VITAMIN D3) 50 MCG (2000 UT) TABS, Take 2,000 Units by mouth daily., Disp: , Rfl:  .  Coenzyme Q10 (COQ10) 100 MG CAPS, Take 100 mg by mouth daily., Disp: , Rfl:  .  metoprolol succinate (TOPROL XL) 25 MG 24 hr tablet, Take 0.5 tablets (12.5 mg total) by mouth daily. (Patient taking differently: Take 25 mg by mouth at bedtime. ), Disp: 30 tablet, Rfl: 11 .  Multiple Vitamin (MULTIVITAMIN WITH MINERALS) TABS tablet, Take 1 tablet by mouth daily., Disp: , Rfl:  .  Omega-3 Fatty Acids (FISH OIL PO), Take 2,000 mg by mouth daily. , Disp: , Rfl:  .  Probiotic Product (ACIDOPHILUS SUPER PROBIOTIC PO), Take 1 tablet by mouth daily., Disp: , Rfl:  .  acetaminophen (TYLENOL) 325 MG tablet, Take 650 mg by mouth every 6 (six) hours as needed for mild pain. (Patient not taking: Reported on 11/02/2019), Disp: , Rfl:  .  allopurinol (ZYLOPRIM) 100 MG tablet, Take 1 tablet (100 mg total) by mouth daily. (Patient not taking: Reported on 11/02/2019), Disp: 30 tablet, Rfl: 6 .  ibuprofen (ADVIL) 200 MG tablet, Take 400 mg by mouth every 8 (eight) hours as needed (pain.). (Patient not taking: Reported on 11/02/2019), Disp: , Rfl:   Past Medical History: Past Medical History:  Diagnosis Date   . Bone tumor 11/22/2013  . Cardiomyopathy ([place])    a. EF 30 to 35% by echo in 06/2019 in the setting of severe AI  . Encounter for screening for malignant neoplasm of colon 06/27/2019  . Glenoid labral tear 10/27/2010  . Hyperlipidemia   . Rotator cuff tear, right 10/27/2010  . S/P aortic valve replacement with bioprosthetic valve 08/09/2019   29 mm Edwards Inspiris Resilia stented bovine pericardial tissue valve  . S/P patent foramen ovale closure 08/09/2019  . Shoulder injury 10/27/2010    Tobacco Use: Social History   Tobacco Use  Smoking Status Former Smoker  . Packs/day: 0.25  . Types: Cigarettes  . Quit date: 08/02/2019  . Years since quitting: 0.2  Smokeless Tobacco Never Used  Tobacco Comment   Cutting down < 5 cig / day    Labs: Recent Review Flowsheet Data    Labs for ITP Cardiac and Pulmonary Rehab Latest Ref Rng & Units 08/09/2019 08/09/2019 08/09/2019 08/09/2019 08/09/2019   Cholestrol <200 mg/dL - - - - -   LDLCALC mg/dL (calc) - - - - -   HDL > OR = 40 mg/dL - - - - -   Trlycerides <150 mg/dL - - - - -   Hemoglobin A1c 4.8 - 5.6 % - - - - -   PHART 7.35 - 7.45 - 7.302(L) 7.276(L) 7.336(L) 7.288(L)   PCO2ART 32 - 48  mmHg - 49.0(H) 49.7(H) 42.4 46.5   HCO3 20.0 - 28.0 mmol/L - 24.3 23.2 22.7 22.4   TCO2 22 - 32 mmol/L 26 26 25 24 24    ACIDBASEDEF 0.0 - 2.0 mmol/L - 2.0 4.0(H) 3.0(H) 5.0(H)   O2SAT % - 100.0 96.0 95.0 98.0      Capillary Blood Glucose: Lab Results  Component Value Date   GLUCAP 97 08/12/2019   GLUCAP 83 08/12/2019   GLUCAP 114 (H) 08/11/2019   GLUCAP 112 (H) 08/11/2019   GLUCAP 95 08/11/2019     Exercise Target Goals: Exercise Program Goal: Individual exercise prescription set using results from initial 6 min walk test and THRR while considering  patient's activity barriers and safety.   Exercise Prescription Goal: Starting with aerobic activity 30 plus minutes a day, 3 days per week for initial exercise prescription. Provide home exercise  prescription and guidelines that participant acknowledges understanding prior to discharge.  Activity Barriers & Risk Stratification:  Activity Barriers & Cardiac Risk Stratification - 11/02/19 1446      Activity Barriers & Cardiac Risk Stratification   Activity Barriers Deconditioning    Comments Bilateral shoulder surgery and left hip has been bothering him.    Cardiac Risk Stratification High           6 Minute Walk:  6 Minute Walk    Row Name 11/02/19 1434         6 Minute Walk   Phase Initial     Distance 1300 feet     Walk Time 6 minutes     # of Rest Breaks 0     MPH 2.46     METS 4.05     RPE 9     VO2 Peak 14.18     Symptoms No     Resting HR 81 bpm     Resting BP 110/80     Resting Oxygen Saturation  96 %     Exercise Oxygen Saturation  during 6 min walk 96 %     Max Ex. HR 104 bpm     Max Ex. BP 118/82     2 Minute Post BP 130/80            Oxygen Initial Assessment:   Oxygen Re-Evaluation:   Oxygen Discharge (Final Oxygen Re-Evaluation):   Initial Exercise Prescription:  Initial Exercise Prescription - 11/02/19 1400      Date of Initial Exercise RX and Referring Provider   Date 11/02/19    Referring Provider [name]    Expected Discharge Date 02/02/20      Treadmill   MPH 1.5    Grade 0    Minutes 17    METs 2.14      Recumbant Elliptical   Level 1    RPM 60    Minutes 22    METs --      Prescription Details   Frequency (times per week) 3    Duration Progress to 30 minutes of continuous aerobic without signs/symptoms of physical distress      Intensity   THRR 40-80% of Max Heartrate 67-134    Ratings of Perceived Exertion 11-13    Perceived Dyspnea 0-4      Progression   Progression Continue progressive overload as per policy without signs/symptoms or physical distress.      Resistance Training   Training Prescription Yes    Weight 2    Reps 10-15  Perform Capillary Blood Glucose checks as  needed.  Exercise Prescription Changes:   Exercise Comments:   Exercise Comments    Row Name 11/02/19 1504           Exercise Comments Completed walk test with no signs or symptoms.              Exercise Goals and Review:   Exercise Goals    Row Name 11/02/19 1450             Exercise Goals   Increase Physical Activity Yes       Intervention Provide advice, education, support and counseling about physical activity/exercise needs.;Develop an individualized exercise prescription for aerobic and resistive training based on initial evaluation findings, risk stratification, comorbidities and participant's personal goals.       Expected Outcomes Short Term: Attend rehab on a regular basis to increase amount of physical activity.;Long Term: Add in home exercise to make exercise part of routine and to increase amount of physical activity.;Long Term: Exercising regularly at least 3-5 days a week.       Increase Strength and Stamina Yes       Intervention Provide advice, education, support and counseling about physical activity/exercise needs.;Develop an individualized exercise prescription for aerobic and resistive training based on initial evaluation findings, risk stratification, comorbidities and participant's personal goals.       Expected Outcomes Short Term: Increase workloads from initial exercise prescription for resistance, speed, and METs.;Short Term: Perform resistance training exercises routinely during rehab and add in resistance training at home;Long Term: Improve cardiorespiratory fitness, muscular endurance and strength as measured by increased METs and functional capacity (6MWT)       Able to understand and use rate of perceived exertion (RPE) scale Yes       Intervention Provide education and explanation on how to use RPE scale       Expected Outcomes Short Term: Able to use RPE daily in rehab to express subjective intensity level;Long Term:  Able to use RPE to guide  intensity level when exercising independently       Knowledge and understanding of Target Heart Rate Range (THRR) Yes       Intervention Provide education and explanation of THRR including how the numbers were predicted and where they are located for reference       Expected Outcomes Short Term: Able to state/look up THRR;Short Term: Able to use daily as guideline for intensity in rehab;Long Term: Able to use THRR to govern intensity when exercising independently       Able to check pulse independently Yes       Intervention Provide education and demonstration on how to check pulse in carotid and radial arteries.;Review the importance of being able to check your own pulse for safety during independent exercise       Expected Outcomes Short Term: Able to explain why pulse checking is important during independent exercise;Long Term: Able to check pulse independently and accurately       Understanding of Exercise Prescription Yes       Intervention Provide education, explanation, and written materials on patient's individual exercise prescription       Expected Outcomes Short Term: Able to explain program exercise prescription;Long Term: Able to explain home exercise prescription to exercise independently              Exercise Goals Re-Evaluation :    Discharge Exercise Prescription (Final Exercise Prescription Changes):   Nutrition:  Target Goals: Understanding  of nutrition guidelines, daily intake of sodium 1500mg , cholesterol 200mg , calories 30% from fat and 7% or less from saturated fats, daily to have 5 or more servings of fruits and vegetables.  Biometrics:  Pre Biometrics - 11/02/19 1452      Pre Biometrics   Height 6\' 2"  (1.88 m)    Weight 90.9 kg    Waist Circumference 38 inches    Hip Circumference 40 inches    Waist to Hip Ratio 0.95 %    BMI (Calculated) 25.72    Triceps Skinfold 5 mm    % Body Fat 20.5 %    Grip Strength 39.6 kg    Flexibility 0 in    Single Leg  Stand 2.53 seconds            Nutrition Therapy Plan and Nutrition Goals:  Nutrition Therapy & Goals - 11/02/19 1429      Personal Nutrition Goals   Comments Patient scored a 6 on his medificts assessment score. He says he and his wife try to eat healthy eating fresh fruits and vegetables, no pork or beef. He is not sure if he is interested in meeting with RD due to his work schedule. Will continue to monitor.      Intervention Plan   Intervention Nutrition handout(s) given to patient.           Nutrition Assessments:  Nutrition Assessments - 11/02/19 1430      MEDFICTS Scores   Pre Score 6           Nutrition Goals Re-Evaluation:   Nutrition Goals Discharge (Final Nutrition Goals Re-Evaluation):   Psychosocial: Target Goals: Acknowledge presence or absence of significant depression and/or stress, maximize coping skills, provide positive support system. Participant is able to verbalize types and ability to use techniques and skills needed for reducing stress and depression.  Initial Review & Psychosocial Screening:  Initial Psych Review & Screening - 11/02/19 1439      Initial Review   Current issues with None Identified      Family Dynamics   Good Support System? Yes      Barriers   Psychosocial barriers to participate in program There are no identifiable barriers or psychosocial needs.      Screening Interventions   Interventions Encouraged to exercise;Provide feedback about the scores to participant    Expected Outcomes Short Term goal: Identification and review with participant of any Quality of Life or Depression concerns found by scoring the questionnaire.;Long Term goal: The participant improves quality of Life and PHQ9 Scores as seen by post scores and/or verbalization of changes           Quality of Life Scores:  Quality of Life - 11/02/19 1459      Quality of Life   Select Quality of Life      Quality of Life Scores   Health/Function Pre  23.5 %    Socioeconomic Pre 21.71 %    Psych/Spiritual Pre 17.33 %    Family Pre 25.4 %    GLOBAL Pre 22.29 %          Scores of 19 and below usually indicate a poorer quality of life in these areas.  A difference of  2-3 points is a clinically meaningful difference.  A difference of 2-3 points in the total score of the Quality of Life Index has been associated with significant improvement in overall quality of life, self-image, physical symptoms, and general health in studies assessing change in  quality of life.  PHQ-9: Recent Review Flowsheet Data    Depression screen [facility] 2/9 11/02/2019 10/03/2019 06/27/2019   Decreased Interest 0 0 0   Down, Depressed, Hopeless 0 0 0   PHQ - 2 Score 0 0 0   Altered sleeping 0 3 -   Tired, decreased energy 0 0 -   Change in appetite 0 0 -   Feeling bad or failure about yourself  0 0 -   Trouble concentrating 0 0 -   Moving slowly or fidgety/restless 0 0 -   Suicidal thoughts 0 0 -   PHQ-9 Score 0 3 -   Difficult doing work/chores Not difficult at all Not difficult at all -     Interpretation of Total Score  Total Score Depression Severity:  1-4 = Minimal depression, 5-9 = Mild depression, 10-14 = Moderate depression, 15-19 = Moderately severe depression, 20-27 = Severe depression   Psychosocial Evaluation and Intervention:  Psychosocial Evaluation - 11/02/19 1440      Psychosocial Evaluation & Interventions   Interventions Encouraged to exercise with the program and follow exercise prescription;Stress management education;Relaxation education    Comments Patient has no psychosocial issues identified at his orientation visit. His QOL score was 22.29 and his PHQ-9 score was 0. He has good family support.    Expected Outcomes Patient will have no psychosocial issues identified at discharge.    Continue Psychosocial Services  No Follow up required           Psychosocial Re-Evaluation:   Psychosocial Discharge (Final Psychosocial  Re-Evaluation):   Vocational Rehabilitation: Provide vocational rehab assistance to qualifying candidates.   Vocational Rehab Evaluation & Intervention:  Vocational Rehab - 11/02/19 1431      Vocational Rehab Re-Evaulation   Comments Patient plans to return to full-time employment next week. He does not need vocational rehab.           Education: Education Goals: Education classes will be provided on a weekly basis, covering required topics. Participant will state understanding/return demonstration of topics presented.  Learning Barriers/Preferences:  Learning Barriers/Preferences - 11/02/19 1431      Learning Barriers/Preferences   Learning Barriers None    Learning Preferences Written Material;Skilled Demonstration           Education Topics: Hypertension, Hypertension Reduction -Define heart disease and high blood pressure. Discus how high blood pressure affects the body and ways to reduce high blood pressure.   Exercise and Your Heart -Discuss why it is important to exercise, the FITT principles of exercise, normal and abnormal responses to exercise, and how to exercise safely.   Angina -Discuss definition of angina, causes of angina, treatment of angina, and how to decrease risk of having angina.   Cardiac Medications -Review what the following cardiac medications are used for, how they affect the body, and side effects that may occur when taking the medications.  Medications include Aspirin, Beta blockers, calcium channel blockers, ACE Inhibitors, angiotensin receptor blockers, diuretics, digoxin, and antihyperlipidemics.   Congestive Heart Failure -Discuss the definition of CHF, how to live with CHF, the signs and symptoms of CHF, and how keep track of weight and sodium intake.   Heart Disease and Intimacy -Discus the effect sexual activity has on the heart, how changes occur during intimacy as we age, and safety during sexual activity.   Smoking  Cessation / COPD -Discuss different methods to quit smoking, the health benefits of quitting smoking, and the definition of COPD.   Nutrition I:  Fats -Discuss the types of cholesterol, what cholesterol does to the heart, and how cholesterol levels can be controlled.   Nutrition II: Labels -Discuss the different components of food labels and how to read food label   Heart Parts/Heart Disease and PAD -Discuss the anatomy of the heart, the pathway of blood circulation through the heart, and these are affected by heart disease.   Stress I: Signs and Symptoms -Discuss the causes of stress, how stress may lead to anxiety and depression, and ways to limit stress.   Stress II: Relaxation -Discuss different types of relaxation techniques to limit stress.   Warning Signs of Stroke / TIA -Discuss definition of a stroke, what the signs and symptoms are of a stroke, and how to identify when someone is having stroke.   Knowledge Questionnaire Score:  Knowledge Questionnaire Score - 11/02/19 1431      Knowledge Questionnaire Score   Pre Score 28/28           Core Components/Risk Factors/Patient Goals at Admission:  Personal Goals and Risk Factors at Admission - 11/02/19 1431      Core Components/Risk Factors/Patient Goals on Admission    Weight Management Weight Maintenance    Lipids Yes    Intervention Provide education and support for participant on nutrition & aerobic/resistive exercise along with prescribed medications to achieve LDL 70mg , HDL >40mg .    Expected Outcomes Short Term: Participant states understanding of desired cholesterol values and is compliant with medications prescribed. Participant is following exercise prescription and nutrition guidelines.    Personal Goal Other Yes    Personal Goal Get his heart healthy and be health overall.    Intervention Patient will attend CR 3 days/week and supplement with exercise at home 2 days/week.    Expected Outcomes Patient  will meet both program and personal goals.           Core Components/Risk Factors/Patient Goals Review:    Core Components/Risk Factors/Patient Goals at Discharge (Final Review):    ITP Comments:   Comments: Patient arrived for 1st visit/orientation/education at 1230. Patient was referred to CR by Rozann Lesches due to S/P Aortic Valve Replacement (Z95.2). During orientation advised patient on arrival and appointment times what to wear, what to do before, during and after exercise. Reviewed attendance and class policy. Talked about inclement weather and class consultation policy. Pt is scheduled to return Cardiac Rehab on 11/07/2019 at 9:30. Pt was advised to come to class 15 minutes before class starts. Patient was also given instructions on meeting with the dietician and attending the Family Structure classes. Discussed RPE/Dpysnea scales. Discussed initial THR and how to find their radial and/or carotid pulse. Discussed the initial exercise prescription and how this effects their progress. Pt is eager to get started. Patient participated in warm up stretches followed by light weights and resistance bands. Patient was able to complete 6 minute walk test. Patient was measured for the equipment. Discussed equipment safety with patient. Took patient pre-anthropometric measurements. Patient finished visit at 14:15.

## 2019-11-02 NOTE — Telephone Encounter (Signed)
He would have a similar return to work date from my perspective as that indicated by Dr. Ricard Dillon.  July 12 should be fine.

## 2019-11-05 ENCOUNTER — Ambulatory Visit (INDEPENDENT_AMBULATORY_CARE_PROVIDER_SITE_OTHER): Payer: BC Managed Care – PPO

## 2019-11-05 ENCOUNTER — Other Ambulatory Visit: Payer: Self-pay

## 2019-11-05 ENCOUNTER — Ambulatory Visit (INDEPENDENT_AMBULATORY_CARE_PROVIDER_SITE_OTHER): Payer: BC Managed Care – PPO | Admitting: Podiatry

## 2019-11-05 DIAGNOSIS — M779 Enthesopathy, unspecified: Secondary | ICD-10-CM | POA: Diagnosis not present

## 2019-11-05 DIAGNOSIS — G629 Polyneuropathy, unspecified: Secondary | ICD-10-CM | POA: Diagnosis not present

## 2019-11-05 DIAGNOSIS — M216X9 Other acquired deformities of unspecified foot: Secondary | ICD-10-CM

## 2019-11-05 DIAGNOSIS — M79671 Pain in right foot: Secondary | ICD-10-CM

## 2019-11-05 DIAGNOSIS — M79672 Pain in left foot: Secondary | ICD-10-CM

## 2019-11-05 MED ORDER — GABAPENTIN 300 MG PO CAPS
300.0000 mg | ORAL_CAPSULE | Freq: Every day | ORAL | 0 refills | Status: DC
Start: 2019-11-05 — End: 2019-12-12

## 2019-11-05 MED ORDER — DICLOFENAC SODIUM 1 % EX GEL: 2.0000 g | Freq: Two times a day (BID) | CUTANEOUS | 2 refills | Status: DC | PRN

## 2019-11-05 NOTE — Progress Notes (Signed)
F/O note;  Casted; hug arch flex co-poly, offload cuboid/base 5 RT (marked) 1/8 lift relieve AT insert.

## 2019-11-05 NOTE — Patient Instructions (Signed)

## 2019-11-06 NOTE — Progress Notes (Addendum)
Subjective:   Patient ID: Donald [name], male   DOB: 54 y.o.   MRN: [id number]   HPI 54 year old male presents the office today for concerns of bilateral chronic foot pain which has been ongoing for quite some time.  He describes generalized achiness to his feet as well as discomfort after being on his feet.  He gets pain to the fifth metatarsal base on the right side worse than left.  He will get some mild numbness and tingling to the feet intermittently but not to be the main issue the majority of it is that discomfort to the feet. He has tried over-the-counter inserts without any improvement.   Review of Systems  All other systems reviewed and are negative.  Past Medical History:  Diagnosis Date  . Bone tumor 11/22/2013  . Cardiomyopathy ([place])    a. EF 30 to 35% by echo in 06/2019 in the setting of severe AI  . Encounter for screening for malignant neoplasm of colon 06/27/2019  . Glenoid labral tear 10/27/2010  . Hyperlipidemia   . Rotator cuff tear, right 10/27/2010  . S/P aortic valve replacement with bioprosthetic valve 08/09/2019   29 mm Edwards Inspiris Resilia stented bovine pericardial tissue valve  . S/P patent foramen ovale closure 08/09/2019  . Shoulder injury 10/27/2010    Past Surgical History:  Procedure Laterality Date  . AORTIC VALVE REPLACEMENT N/A 08/09/2019   Procedure: AORTIC VALVE REPLACEMENT (AVR) using Margaretha Sheffield Resilia 29 MM Aortic Valve.;  Surgeon: Rexene Alberts, MD;  Location: [facility];  Service: Open Heart Surgery;  Laterality: N/A;  . BACK SURGERY     multiple back surgery  . Benign tumor     Left shin  . BUBBLE STUDY  07/05/2019   Procedure: BUBBLE STUDY;  Surgeon: Satira Sark, MD;  Location: [facility];  Service: Cardiovascular;;  . HERNIA REPAIR    . REPAIR OF PATENT FORAMEN OVALE N/A 08/09/2019   Procedure: Closure Of Patent Foramen Ovale;  Surgeon: Rexene Alberts, MD;  Location: [facility];  Service: Open Heart Surgery;  Laterality: N/A;  . RIGHT/LEFT  HEART CATH AND CORONARY ANGIOGRAPHY N/A 07/26/2019   Procedure: RIGHT/LEFT HEART CATH AND CORONARY ANGIOGRAPHY;  Surgeon: Burnell Blanks, MD;  Location: [facility];  Service: Cardiovascular;  Laterality: N/A;  . SHOULDER SURGERY Left   . SHOULDER SURGERY Right   . TEE WITHOUT CARDIOVERSION N/A 07/05/2019   Procedure: TRANSESOPHAGEAL ECHOCARDIOGRAM (TEE) WITH PROPOFOL;  Surgeon: Satira Sark, MD;  Location: [facility];  Service: Cardiovascular;  Laterality: N/A;  . TEE WITHOUT CARDIOVERSION N/A 08/09/2019   Procedure: TRANSESOPHAGEAL ECHOCARDIOGRAM (TEE);  Surgeon: Rexene Alberts, MD;  Location: [facility];  Service: Open Heart Surgery;  Laterality: N/A;  . Thumb surgery Left x3     Current Outpatient Medications:  .  acetaminophen (TYLENOL) 325 MG tablet, Take 650 mg by mouth every 6 (six) hours as needed for mild pain. (Patient not taking: Reported on 11/02/2019), Disp: , Rfl:  .  allopurinol (ZYLOPRIM) 100 MG tablet, Take 1 tablet (100 mg total) by mouth daily. (Patient not taking: Reported on 11/02/2019), Disp: 30 tablet, Rfl: 6 .  aspirin EC 325 MG EC tablet, Take 1 tablet (325 mg total) by mouth daily., Disp: 30 tablet, Rfl: 0 .  atorvastatin (LIPITOR) 10 MG tablet, Take 10 mg by mouth every evening. , Disp: , Rfl:  .  Cholecalciferol (VITAMIN D3) 50 MCG (2000 UT) TABS, Take 2,000 Units by mouth daily.,  Disp: , Rfl:  .  Coenzyme Q10 (COQ10) 100 MG CAPS, Take 100 mg by mouth daily., Disp: , Rfl:  .  diclofenac Sodium (VOLTAREN) 1 % GEL, Apply 2 g topically 2 (two) times daily as needed. Rub into affected area of foot 2 times daily as needed, Disp: 100 g, Rfl: 2 .  gabapentin (NEURONTIN) 300 MG capsule, Take 1 capsule (300 mg total) by mouth at bedtime., Disp: 90 capsule, Rfl: 0 .  ibuprofen (ADVIL) 200 MG tablet, Take 400 mg by mouth every 8 (eight) hours as needed (pain.). (Patient not taking: Reported on 11/02/2019), Disp: , Rfl:  .  metoprolol succinate (TOPROL XL) 25 MG 24 hr  tablet, Take 0.5 tablets (12.5 mg total) by mouth daily. (Patient taking differently: Take 25 mg by mouth at bedtime. ), Disp: 30 tablet, Rfl: 11 .  Multiple Vitamin (MULTIVITAMIN WITH MINERALS) TABS tablet, Take 1 tablet by mouth daily., Disp: , Rfl:  .  Omega-3 Fatty Acids (FISH OIL PO), Take 2,000 mg by mouth daily. , Disp: , Rfl:  .  Probiotic Product (ACIDOPHILUS SUPER PROBIOTIC PO), Take 1 tablet by mouth daily., Disp: , Rfl:   Allergies  Allergen Reactions  . Ultram [Tramadol Hcl] Itching    "Tunnel vision" warm feeling all over and wanted to jump out of his skin          Objective:  Physical Exam  General: AAO x3, NAD  Dermatological: Skin is warm, dry and supple bilateral. Nails x 10 are well manicured; remaining integument appears unremarkable at this time. There are no open sores, no preulcerative lesions, no rash or signs of infection present.  Vascular: Dorsalis Pedis artery and Posterior Tibial artery pedal pulses are 2/4 bilateral with immedate capillary fill time.  There is no pain with calf compression, swelling, warmth, erythema.   Neruologic: Sensation decreased with Semmes Weinstein monofilament Musculoskeletal: Cavus foot type is evident.  Tenderness of the fifth metatarsal base on the right side worse than left.  Area of pinpoint tenderness identified.  MMT 5/5.  Flexor, extensor tendons appear to be intact.  No edema, erythema.  Gait: Unassisted, Nonantalgic.       Assessment:   54 year old male with cavus foot type resulting in chronic foot pain, neuropathy     Plan:  -Treatment options discussed including all alternatives, risks, and complications -Etiology of symptoms were discussed -X-rays were obtained and reviewed with the patient.  There is no evidence of acute fracture identified today.  Cavus foot type is evident. -I do think the majority of his pain is structural given his cavus foot type.  Prescribed Voltaren gel and also I think a custom  orthotic will be beneficial.  He was measured for orthotics today by Liliane Channel.  -Also I think that some neuropathy issues could be contribute to his discomfort.  Of discussion with him and start medication for this.  Prescribed gabapentin 300 mg at nighttime discussed side effects medication.  We will titrate up if he can tolerate the medication.  Return in about 4 weeks (around 12/03/2019).  Trula Slade DPM

## 2019-11-07 ENCOUNTER — Other Ambulatory Visit: Payer: Self-pay

## 2019-11-07 ENCOUNTER — Encounter (HOSPITAL_COMMUNITY)
Admission: RE | Admit: 2019-11-07 | Discharge: 2019-11-07 | Disposition: A | Discharge status: Home / Self Care | Payer: BC Managed Care – PPO | Source: Ambulatory Visit | Attending: Cardiology | Admitting: Cardiology

## 2019-11-07 DIAGNOSIS — Z952 Presence of prosthetic heart valve: Secondary | ICD-10-CM

## 2019-11-07 NOTE — Progress Notes (Signed)
Daily Session Note  Patient Details  Name: Donald [name] MRN: [id number] Date of Birth: Apr 13, 1966 Referring Provider:     CARDIAC REHAB PHASE II ORIENTATION from 11/02/2019 in [facility]  Referring Provider [name]      Encounter Date: 11/07/2019  Check In:  Session Check In - 11/07/19 0934      Check-In   Supervising physician immediately available to respond to emergencies See telemetry face sheet for immediately available ER MD    Location [facility]    Staff Present Algis Downs, Exercise Physiologist;Sya Nestler Kris Mouton, MS, ACSM-CEP, Exercise Physiologist;Carlette Wilber Oliphant, RN, Jennye Moccasin, RN, BSN    Virtual Visit No    Medication changes reported     No    Fall or balance concerns reported    No    Tobacco Cessation No Change    Current number of cigarettes/nicotine per day     5    Warm-up and Cool-down Performed on first and last piece of equipment    Resistance Training Performed Yes    VAD Patient? No    PAD/SET Patient? No      Pain Assessment   Currently in Pain? No/denies    Pain Score 0-No pain    Multiple Pain Sites No           Capillary Blood Glucose: No results found for this or any previous visit (from the past 24 hour(s)).    Social History   Tobacco Use  Smoking Status Former Smoker  . Packs/day: 0.25  . Types: Cigarettes  . Quit date: 08/02/2019  . Years since quitting: 0.2  Smokeless Tobacco Never Used  Tobacco Comment   Cutting down < 5 cig / day    Goals Met:  Independence with exercise equipment Exercise tolerated well No report of cardiac concerns or symptoms Strength training completed today  Goals Unmet:  Not Applicable  Comments: checkout time is 1030   Dr. Kathie Dike is Medical Director for [facility] Pulmonary Rehab.

## 2019-11-09 ENCOUNTER — Encounter (HOSPITAL_COMMUNITY)
Admission: RE | Admit: 2019-11-09 | Discharge: 2019-11-09 | Disposition: A | Discharge status: Home / Self Care | Payer: BC Managed Care – PPO | Source: Ambulatory Visit | Attending: Cardiology | Admitting: Cardiology

## 2019-11-09 ENCOUNTER — Other Ambulatory Visit: Payer: Self-pay

## 2019-11-09 DIAGNOSIS — Z952 Presence of prosthetic heart valve: Secondary | ICD-10-CM

## 2019-11-09 NOTE — Progress Notes (Signed)
Daily Session Note  Patient Details  Name: RAJON BISIG MRN: [id number] Date of Birth: May 15, 1965 Referring Provider:     CARDIAC REHAB PHASE II ORIENTATION from 11/02/2019 in [place]  Referring Provider [name]      Encounter Date: 11/09/2019  Check In:  Session Check In - 11/09/19 0951      Check-In   Supervising physician immediately available to respond to emergencies See telemetry face sheet for immediately available MD    Staff Present Algis Downs, Exercise Physiologist;Dalton Kris Mouton, MS, ACSM-CEP, Exercise Physiologist;Carlette Wilber Oliphant, RN, BSN    Virtual Visit No    Medication changes reported     No    Fall or balance concerns reported    No    Tobacco Cessation No Change    Warm-up and Cool-down Performed as group-led instruction    Resistance Training Performed Yes    PAD/SET Patient? No      Pain Assessment   Currently in Pain? No/denies    Pain Score 0-No pain    Multiple Pain Sites No           Capillary Blood Glucose: No results found for this or any previous visit (from the past 24 hour(s)).    Social History   Tobacco Use  Smoking Status Former Smoker  . Packs/day: 0.25  . Types: Cigarettes  . Quit date: 08/02/2019  . Years since quitting: 0.2  Smokeless Tobacco Never Used  Tobacco Comment   Cutting down < 5 cig / day    Goals Met:  Independence with exercise equipment Exercise tolerated well No report of cardiac concerns or symptoms Strength training completed today  Goals Unmet:  Not Applicable  Comments: Check out: 930   Dr. Kathie Dike is Medical Director for [facility] Pulmonary Rehab.

## 2019-11-09 NOTE — Progress Notes (Signed)
Daily Session Note  Patient Details  Name: Donald [name] MRN: [id number] Date of Birth: 1966/03/08 Referring Provider:     CARDIAC REHAB PHASE II ORIENTATION from 11/02/2019 in [place]  Referring Provider [name]      Encounter Date: 11/09/2019  Check In:  Session Check In - 11/09/19 0951      Check-In   Supervising physician immediately available to respond to emergencies See telemetry face sheet for immediately available MD    Staff Present Algis Downs, Exercise Physiologist;Dalton Kris Mouton, MS, ACSM-CEP, Exercise Physiologist;Galilee Pierron Wilber Oliphant, RN, BSN    Virtual Visit No    Medication changes reported     No    Fall or balance concerns reported    No    Tobacco Cessation No Change    Warm-up and Cool-down Performed as group-led instruction    Resistance Training Performed Yes    PAD/SET Patient? No      Pain Assessment   Currently in Pain? No/denies    Pain Score 0-No pain    Multiple Pain Sites No           Capillary Blood Glucose: No results found for this or any previous visit (from the past 24 hour(s)).    Social History   Tobacco Use  Smoking Status Former Smoker  . Packs/day: 0.25  . Types: Cigarettes  . Quit date: 08/02/2019  . Years since quitting: 0.2  Smokeless Tobacco Never Used  Tobacco Comment   Cutting down < 5 cig / day    Goals Met:  Exercise tolerated well Personal goals reviewed No report of cardiac concerns or symptoms  Goals Unmet:  Not Applicable  Comments: Pt returned for his second day of exercise.  Pt returned to work on yesterday.  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  Pt has access to water.  Encouraged pt to pace himself and hydrate well. Pt did well for his first day of exercise. Pt oriented to the routine of the exercise session along with RPE scale and Target Heart Rate.  Pt SR with rare PVC.  We are looking forward to working with him in Cardiac rehab   Dr. Kathie Dike is  Medical Director for [facility] Pulmonary Rehab.

## 2019-11-12 ENCOUNTER — Encounter (HOSPITAL_COMMUNITY)
Admission: RE | Admit: 2019-11-12 | Discharge: 2019-11-12 | Disposition: A | Discharge status: Home / Self Care | Payer: BC Managed Care – PPO | Source: Ambulatory Visit | Attending: Cardiology | Admitting: Cardiology

## 2019-11-12 ENCOUNTER — Other Ambulatory Visit: Payer: Self-pay

## 2019-11-12 DIAGNOSIS — Z952 Presence of prosthetic heart valve: Secondary | ICD-10-CM | POA: Diagnosis not present

## 2019-11-12 NOTE — Progress Notes (Signed)
Daily Session Note  Patient Details  Name: Donald [name] MRN: [id number] Date of Birth: 03-Feb-1966 Referring Provider:     CARDIAC REHAB PHASE II ORIENTATION from 11/02/2019 in [place]  Referring Provider [name]      Encounter Date: 11/12/2019  Check In:  Session Check In - 11/12/19 0930      Check-In   Supervising physician immediately available to respond to emergencies See telemetry face sheet for immediately available MD    Location [facility]    Staff Present Algis Downs, Exercise Physiologist;Dalton Kris Mouton, MS, ACSM-CEP, Exercise Physiologist    Virtual Visit No    Medication changes reported     No    Fall or balance concerns reported    No    Tobacco Cessation No Change    Warm-up and Cool-down Performed as group-led instruction    Resistance Training Performed Yes    VAD Patient? No    PAD/SET Patient? No      Pain Assessment   Currently in Pain? No/denies    Pain Score 0-No pain    Multiple Pain Sites No           Capillary Blood Glucose: No results found for this or any previous visit (from the past 24 hour(s)).    Social History   Tobacco Use  Smoking Status Former Smoker  . Packs/day: 0.25  . Types: Cigarettes  . Quit date: 08/02/2019  . Years since quitting: 0.2  Smokeless Tobacco Never Used  Tobacco Comment   Cutting down < 5 cig / day    Goals Met:  Independence with exercise equipment Exercise tolerated well No report of cardiac concerns or symptoms Strength training completed today  Goals Unmet:  Not Applicable  Comments: Check out: 1030   Dr. Kathie Dike is Medical Director for [facility] Pulmonary Rehab.

## 2019-11-14 ENCOUNTER — Encounter (HOSPITAL_COMMUNITY)
Admission: RE | Admit: 2019-11-14 | Discharge: 2019-11-14 | Disposition: A | Discharge status: Home / Self Care | Payer: BC Managed Care – PPO | Source: Ambulatory Visit | Attending: Cardiology | Admitting: Cardiology

## 2019-11-14 ENCOUNTER — Other Ambulatory Visit: Payer: Self-pay

## 2019-11-14 VITALS — Wt 200.6 lb

## 2019-11-14 DIAGNOSIS — Z952 Presence of prosthetic heart valve: Secondary | ICD-10-CM | POA: Diagnosis not present

## 2019-11-14 NOTE — Progress Notes (Signed)
Daily Session Note  Patient Details  Name: SHAHIR KAREN MRN: [id number] Date of Birth: 03-24-1966 Referring Provider:     CARDIAC REHAB PHASE II ORIENTATION from 11/02/2019 in [place]  Referring Provider [name]      Encounter Date: 11/14/2019  Check In:  Session Check In - 11/14/19 0930      Check-In   Supervising physician immediately available to respond to emergencies See telemetry face sheet for immediately available MD    Location [facility]    Staff Present Algis Downs, Exercise Physiologist;Carlette Wilber Oliphant, RN, Bjorn Loser, MS, ACSM-CEP, Exercise Physiologist    Virtual Visit No    Medication changes reported     No    Fall or balance concerns reported    No    Tobacco Cessation No Change    Warm-up and Cool-down Performed as group-led instruction    Resistance Training Performed Yes    VAD Patient? No    PAD/SET Patient? No      Pain Assessment   Currently in Pain? No/denies    Pain Score 0-No pain    Multiple Pain Sites No           Capillary Blood Glucose: No results found for this or any previous visit (from the past 24 hour(s)).    Social History   Tobacco Use  Smoking Status Former Smoker  . Packs/day: 0.25  . Types: Cigarettes  . Quit date: 08/02/2019  . Years since quitting: 0.2  Smokeless Tobacco Never Used  Tobacco Comment   Cutting down < 5 cig / day    Goals Met:  Independence with exercise equipment Exercise tolerated well No report of cardiac concerns or symptoms Strength training completed today  Goals Unmet:  Not Applicable  Comments: Check out: 1030   Dr. Kathie Dike is Medical Director for [facility] Pulmonary Rehab.

## 2019-11-16 ENCOUNTER — Encounter (HOSPITAL_COMMUNITY)
Admission: RE | Admit: 2019-11-16 | Discharge: 2019-11-16 | Disposition: A | Discharge status: Home / Self Care | Payer: BC Managed Care – PPO | Source: Ambulatory Visit | Attending: Cardiology | Admitting: Cardiology

## 2019-11-16 ENCOUNTER — Ambulatory Visit (INDEPENDENT_AMBULATORY_CARE_PROVIDER_SITE_OTHER): Payer: BC Managed Care – PPO

## 2019-11-16 ENCOUNTER — Other Ambulatory Visit: Payer: Self-pay

## 2019-11-16 ENCOUNTER — Telehealth: Payer: Self-pay | Admitting: Cardiology

## 2019-11-16 VITALS — BP 104/62 | HR 123 | Ht 74.0 in | Wt 199.0 lb

## 2019-11-16 DIAGNOSIS — R9431 Abnormal electrocardiogram [ECG] [EKG]: Secondary | ICD-10-CM

## 2019-11-16 DIAGNOSIS — Z952 Presence of prosthetic heart valve: Secondary | ICD-10-CM

## 2019-11-16 MED ORDER — METOPROLOL SUCCINATE ER 50 MG PO TB24
50.0000 mg | ORAL_TABLET | Freq: Every day | ORAL | 1 refills | Status: DC
Start: 2019-11-16 — End: 2019-12-12

## 2019-11-16 MED ORDER — APIXABAN 5 MG PO TABS
5.0000 mg | ORAL_TABLET | Freq: Two times a day (BID) | ORAL | 6 refills | Status: DC
Start: 2019-11-16 — End: 2019-12-12

## 2019-11-16 NOTE — Addendum Note (Signed)
Addended by: Merlene Laughter on: 11/16/2019 02:23 PM   Modules accepted: Orders

## 2019-11-16 NOTE — Telephone Encounter (Signed)
Patient informed and verbalized understanding of plan. 

## 2019-11-16 NOTE — Progress Notes (Signed)
Pt returned to exercise today.  Pt heart rhythm showed irregular beats ranging from low 100's to 120's.  Pt pulled aside.  Bp 112/60.  Medications and history reviewed.  Pt is s/p AVR from 4/15 non noted history of afib.  Pt denies any chest pain, shortness of breath or palpitations.  EKG strips obtained.  Advised Dr. Harl Bowie of suspected new onset of afib. Dr. Harl Bowie reviewed strips and medical history.  Plan to have pt come to clinic for 12 lead ekg and visit.  Advised pt of the plan and he voiced understanding.  As we ambulated to the clinic, pt remarked that he did notice that the last couple of days starting in Wednesday he felt he was breathing through three mask.  Pt has returned to work in [place] that is 11's and his job entails heavy lifting.  In fact pt went to work for three hours prior to coming to cardiac rehab for exercise.  Advised pt to relay his shortness of breath that started Wednesday.  Will await plan of care to determine when pt may return to exercise. Cherre Huger, BSN Cardiac and Pulmonary Rehab Nurse Navigator  [place]

## 2019-11-16 NOTE — Progress Notes (Signed)
Incomplete Session Note  Patient Details  Name: Donald [name] MRN: [id number] Date of Birth: 04-03-1966 Referring Provider:     CARDIAC REHAB PHASE II ORIENTATION from 11/02/2019 in [place]  Referring Provider [name] did not complete his rehab session.  New onset of afib.  See the previous progress note. Cherre Huger, BSN Cardiac and Pulmonary Rehab Nurse Navigator  [place]

## 2019-11-16 NOTE — Telephone Encounter (Signed)
Informed today by Dr. Harl Bowie that Mr. Donald [name] was noted to be in atrial fibrillation while at cardiac rehabilitation today.  He was sent to the office in [place] where he had an ECG confirming diagnosis.  I personally reviewed today's tracing which showed atrial fibrillation at 123 bpm, right bundle branch block, nonspecific ST-T changes.  Reportedly patient taking Toprol-XL 25 mg daily (previous headaches resolved -see discussion and last office note).  CHA2DS2-VASc score is 2, cardiomyopathy and mild coronary atherosclerosis.  At this point would recommend switching from aspirin daily to Eliquis 5 mg twice daily for stroke prophylaxis, increase Toprol-XL to 50 mg daily.  Please have him come back for a nurse visit next week in [place] to recheck heart rate and ECG.  We may need to consider setting him up for a cardioversion if this does not resolve.  Satira Sark, M.D., F.A.C.C.

## 2019-11-19 ENCOUNTER — Telehealth: Payer: Self-pay | Admitting: Cardiology

## 2019-11-19 ENCOUNTER — Encounter (HOSPITAL_COMMUNITY): Payer: BC Managed Care – PPO

## 2019-11-19 NOTE — Telephone Encounter (Signed)
With patients new diagnosis of A-fib, his employer wants information on what patient can and cannot due.   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

## 2019-11-19 NOTE — Telephone Encounter (Signed)
Pt states his [employer]rich Birks is requesting a work note   Please call [phone]   Thanks renee

## 2019-11-19 NOTE — Telephone Encounter (Signed)
Honestly, this largely depends on how well he tolerates the atrial fibrillation following the recent medication adjustments, and if in fact he goes back to sinus rhythm or needs a cardioversion.  Would go with the suggested modifications for now and this can be further reevaluated at his follow-up.

## 2019-11-19 NOTE — Telephone Encounter (Signed)
Letter created for patient,he will pick up at office

## 2019-11-21 ENCOUNTER — Encounter (HOSPITAL_COMMUNITY): Payer: BC Managed Care – PPO

## 2019-11-21 ENCOUNTER — Telehealth (HOSPITAL_COMMUNITY): Payer: Self-pay | Admitting: *Deleted

## 2019-11-21 NOTE — Telephone Encounter (Signed)
Left message for pt requesting a call back regards to general well being and absence from cardiac rehab on Monday and today.  Await return call. Cherre Huger, BSN Cardiac and Training and development officer

## 2019-11-23 ENCOUNTER — Telehealth (HOSPITAL_COMMUNITY): Payer: Self-pay | Admitting: *Deleted

## 2019-11-23 ENCOUNTER — Encounter (HOSPITAL_COMMUNITY): Payer: BC Managed Care – PPO

## 2019-11-23 NOTE — Telephone Encounter (Signed)
Gerald Stabs returned call from message left.  Pt stated that he was advised by Dr. [name] not to return to cardiac rehab until he completes his follow up appt on 8/4.  Will await the completion of that visit for plan of care. Cherre Huger, BSN Cardiac and Pulmonary Rehab Nurse Navigator  [place]

## 2019-11-26 ENCOUNTER — Encounter (HOSPITAL_COMMUNITY): Payer: BC Managed Care – PPO

## 2019-11-27 ENCOUNTER — Ambulatory Visit: Payer: BC Managed Care – PPO | Admitting: Orthotics

## 2019-11-27 ENCOUNTER — Other Ambulatory Visit: Payer: Self-pay

## 2019-11-27 DIAGNOSIS — M779 Enthesopathy, unspecified: Secondary | ICD-10-CM

## 2019-11-27 DIAGNOSIS — M216X9 Other acquired deformities of unspecified foot: Secondary | ICD-10-CM

## 2019-11-27 NOTE — Progress Notes (Signed)
Patient came in today to pick up custom made foot orthotics.  The goals were accomplished and the patient reported no dissatisfaction with said orthotics.  Patient was advised of breakin period and how to report any issues. 

## 2019-11-28 ENCOUNTER — Ambulatory Visit (INDEPENDENT_AMBULATORY_CARE_PROVIDER_SITE_OTHER): Payer: BC Managed Care – PPO | Admitting: *Deleted

## 2019-11-28 ENCOUNTER — Encounter (HOSPITAL_COMMUNITY): Payer: BC Managed Care – PPO

## 2019-11-28 DIAGNOSIS — I4891 Unspecified atrial fibrillation: Secondary | ICD-10-CM

## 2019-11-28 NOTE — Progress Notes (Addendum)
Cardiac Individual Treatment Plan  Patient Details  Name: Donald [name] MRN: [id number] Date of Birth: 04/07/1966 Referring Provider:     CARDIAC REHAB PHASE II ORIENTATION from 11/02/2019 in [place]  Referring Provider [name]      Initial Encounter Date:    CARDIAC REHAB PHASE II ORIENTATION from 11/02/2019 in [place]  Date 11/02/19      Visit Diagnosis: S/P aortic valve replacement  Patient's Home Medications on Admission:  Current Outpatient Medications:  .  acetaminophen (TYLENOL) 325 MG tablet, Take 650 mg by mouth every 6 (six) hours as needed for mild pain. (Patient not taking: Reported on 11/02/2019), Disp: , Rfl:  .  allopurinol (ZYLOPRIM) 100 MG tablet, Take 1 tablet (100 mg total) by mouth daily. (Patient not taking: Reported on 11/02/2019), Disp: 30 tablet, Rfl: 6 .  apixaban (ELIQUIS) 5 MG TABS tablet, Take 1 tablet (5 mg total) by mouth 2 (two) times daily., Disp: 60 tablet, Rfl: 6 .  atorvastatin (LIPITOR) 10 MG tablet, Take 10 mg by mouth every evening. , Disp: , Rfl:  .  Cholecalciferol (VITAMIN D3) 50 MCG (2000 UT) TABS, Take 2,000 Units by mouth daily., Disp: , Rfl:  .  Coenzyme Q10 (COQ10) 100 MG CAPS, Take 100 mg by mouth daily., Disp: , Rfl:  .  diclofenac Sodium (VOLTAREN) 1 % GEL, Apply 2 g topically 2 (two) times daily as needed. Rub into affected area of foot 2 times daily as needed, Disp: 100 g, Rfl: 2 .  gabapentin (NEURONTIN) 300 MG capsule, Take 1 capsule (300 mg total) by mouth at bedtime., Disp: 90 capsule, Rfl: 0 .  metoprolol succinate (TOPROL XL) 50 MG 24 hr tablet, Take 1 tablet (50 mg total) by mouth daily. Take with or immediately following a meal., Disp: 90 tablet, Rfl: 1 .  Multiple Vitamin (MULTIVITAMIN WITH MINERALS) TABS tablet, Take 1 tablet by mouth daily., Disp: , Rfl:  .  Omega-3 Fatty Acids (FISH OIL PO), Take 2,000 mg by mouth daily. , Disp: , Rfl:  .  Probiotic Product (ACIDOPHILUS SUPER  PROBIOTIC PO), Take 1 tablet by mouth daily., Disp: , Rfl:   Past Medical History: Past Medical History:  Diagnosis Date  . Bone tumor 11/22/2013  . Cardiomyopathy ([place])    a. EF 30 to 35% by echo in 06/2019 in the setting of severe AI  . Encounter for screening for malignant neoplasm of colon 06/27/2019  . Glenoid labral tear 10/27/2010  . Hyperlipidemia   . Rotator cuff tear, right 10/27/2010  . S/P aortic valve replacement with bioprosthetic valve 08/09/2019   29 mm Edwards Inspiris Resilia stented bovine pericardial tissue valve  . S/P patent foramen ovale closure 08/09/2019  . Shoulder injury 10/27/2010    Tobacco Use: Social History   Tobacco Use  Smoking Status Former Smoker  . Packs/day: 0.25  . Types: Cigarettes  . Quit date: 08/02/2019  . Years since quitting: 0.3  Smokeless Tobacco Never Used  Tobacco Comment   Cutting down < 5 cig / day    Labs: Recent Review Flowsheet Data    Labs for ITP Cardiac and Pulmonary Rehab Latest Ref Rng & Units 08/09/2019 08/09/2019 08/09/2019 08/09/2019 08/09/2019   Cholestrol <200 mg/dL - - - - -   LDLCALC mg/dL (calc) - - - - -   HDL > OR = 40 mg/dL - - - - -   Trlycerides <150 mg/dL - - - - -   Hemoglobin A1c  4.8 - 5.6 % - - - - -   PHART 7.35 - 7.45 - 7.302(L) 7.276(L) 7.336(L) 7.288(L)   PCO2ART 32 - 48 mmHg - 49.0(H) 49.7(H) 42.4 46.5   HCO3 20.0 - 28.0 mmol/L - 24.3 23.2 22.7 22.4   TCO2 22 - 32 mmol/L 26 26 25 24 24    ACIDBASEDEF 0.0 - 2.0 mmol/L - 2.0 4.0(H) 3.0(H) 5.0(H)   O2SAT % - 100.0 96.0 95.0 98.0      Capillary Blood Glucose: Lab Results  Component Value Date   GLUCAP 97 08/12/2019   GLUCAP 83 08/12/2019   GLUCAP 114 (H) 08/11/2019   GLUCAP 112 (H) 08/11/2019   GLUCAP 95 08/11/2019     Exercise Target Goals: Exercise Program Goal: Individual exercise prescription set using results from initial 6 min walk test and THRR while considering  patient's activity barriers and safety.   Exercise Prescription  Goal: Starting with aerobic activity 30 plus minutes a day, 3 days per week for initial exercise prescription. Provide home exercise prescription and guidelines that participant acknowledges understanding prior to discharge.  Activity Barriers & Risk Stratification:  Activity Barriers & Cardiac Risk Stratification - 11/02/19 1446      Activity Barriers & Cardiac Risk Stratification   Activity Barriers Deconditioning    Comments Bilateral shoulder surgery and left hip has been bothering him.    Cardiac Risk Stratification High           6 Minute Walk:  6 Minute Walk    Row Name 11/02/19 1434         6 Minute Walk   Phase Initial     Distance 1300 feet     Walk Time 6 minutes     # of Rest Breaks 0     MPH 2.46     METS 4.05     RPE 9     VO2 Peak 14.18     Symptoms No     Resting HR 81 bpm     Resting BP 110/80     Resting Oxygen Saturation  96 %     Exercise Oxygen Saturation  during 6 min walk 96 %     Max Ex. HR 104 bpm     Max Ex. BP 118/82     2 Minute Post BP 130/80            Oxygen Initial Assessment:   Oxygen Re-Evaluation:   Oxygen Discharge (Final Oxygen Re-Evaluation):   Initial Exercise Prescription:  Initial Exercise Prescription - 11/02/19 1400      Date of Initial Exercise RX and Referring Provider   Date 11/02/19    Referring Provider [name]    Expected Discharge Date 02/02/20      Treadmill   MPH 1.5    Grade 0    Minutes 17    METs 2.14      Recumbant Elliptical   Level 1    RPM 60    Minutes 22    METs --      Prescription Details   Frequency (times per week) 3    Duration Progress to 30 minutes of continuous aerobic without signs/symptoms of physical distress      Intensity   THRR 40-80% of Max Heartrate 67-134    Ratings of Perceived Exertion 11-13    Perceived Dyspnea 0-4      Progression   Progression Continue progressive overload as per policy without signs/symptoms or physical distress.      Resistance  Training   Training Prescription Yes    Weight 2    Reps 10-15           Perform Capillary Blood Glucose checks as needed.  Exercise Prescription Changes:  Exercise Prescription Changes    Row Name 11/14/19 1329             Response to Exercise   Blood Pressure (Admit) 124/82       Blood Pressure (Exercise) 140/80       Blood Pressure (Exit) 126/80       Heart Rate (Admit) 85 bpm       Heart Rate (Exercise) 106 bpm       Heart Rate (Exit) 94 bpm       Rating of Perceived Exertion (Exercise) 13       Duration Continue with 30 min of aerobic exercise without signs/symptoms of physical distress.       Intensity THRR unchanged         Progression   Progression Continue to progress workloads to maintain intensity without signs/symptoms of physical distress.         Resistance Training   Training Prescription Yes       Weight 3       Reps 10-15         Treadmill   MPH 2.5       Grade 0       Minutes 22       METs 2.91         Recumbant Elliptical   Level 1       RPM 53       Minutes 17       METs 4.3              Exercise Comments:  Exercise Comments    Row Name 11/02/19 1504           Exercise Comments Completed walk test with no signs or symptoms.              Exercise Goals and Review:  Exercise Goals    Row Name 11/02/19 1450 11/26/19 1428           Exercise Goals   Increase Physical Activity Yes Yes      Intervention Provide advice, education, support and counseling about physical activity/exercise needs.;Develop an individualized exercise prescription for aerobic and resistive training based on initial evaluation findings, risk stratification, comorbidities and participant's personal goals. Provide advice, education, support and counseling about physical activity/exercise needs.;Develop an individualized exercise prescription for aerobic and resistive training based on initial evaluation findings, risk stratification, comorbidities and  participant's personal goals.      Expected Outcomes Short Term: Attend rehab on a regular basis to increase amount of physical activity.;Long Term: Add in home exercise to make exercise part of routine and to increase amount of physical activity.;Long Term: Exercising regularly at least 3-5 days a week. Short Term: Attend rehab on a regular basis to increase amount of physical activity.;Long Term: Add in home exercise to make exercise part of routine and to increase amount of physical activity.;Long Term: Exercising regularly at least 3-5 days a week.      Increase Strength and Stamina Yes Yes      Intervention Provide advice, education, support and counseling about physical activity/exercise needs.;Develop an individualized exercise prescription for aerobic and resistive training based on initial evaluation findings, risk stratification, comorbidities and participant's personal goals. Provide advice, education, support and counseling about physical activity/exercise  needs.;Develop an individualized exercise prescription for aerobic and resistive training based on initial evaluation findings, risk stratification, comorbidities and participant's personal goals.      Expected Outcomes Short Term: Increase workloads from initial exercise prescription for resistance, speed, and METs.;Short Term: Perform resistance training exercises routinely during rehab and add in resistance training at home;Long Term: Improve cardiorespiratory fitness, muscular endurance and strength as measured by increased METs and functional capacity (6MWT) Short Term: Increase workloads from initial exercise prescription for resistance, speed, and METs.;Short Term: Perform resistance training exercises routinely during rehab and add in resistance training at home;Long Term: Improve cardiorespiratory fitness, muscular endurance and strength as measured by increased METs and functional capacity (6MWT)      Able to understand and use rate of  perceived exertion (RPE) scale Yes Yes      Intervention Provide education and explanation on how to use RPE scale Provide education and explanation on how to use RPE scale      Expected Outcomes Short Term: Able to use RPE daily in rehab to express subjective intensity level;Long Term:  Able to use RPE to guide intensity level when exercising independently Short Term: Able to use RPE daily in rehab to express subjective intensity level;Long Term:  Able to use RPE to guide intensity level when exercising independently      Knowledge and understanding of Target Heart Rate Range (THRR) Yes Yes      Intervention Provide education and explanation of THRR including how the numbers were predicted and where they are located for reference Provide education and explanation of THRR including how the numbers were predicted and where they are located for reference      Expected Outcomes Short Term: Able to state/look up THRR;Short Term: Able to use daily as guideline for intensity in rehab;Long Term: Able to use THRR to govern intensity when exercising independently Short Term: Able to state/look up THRR;Short Term: Able to use daily as guideline for intensity in rehab;Long Term: Able to use THRR to govern intensity when exercising independently      Able to check pulse independently Yes Yes      Intervention Provide education and demonstration on how to check pulse in carotid and radial arteries.;Review the importance of being able to check your own pulse for safety during independent exercise Provide education and demonstration on how to check pulse in carotid and radial arteries.;Review the importance of being able to check your own pulse for safety during independent exercise      Expected Outcomes Short Term: Able to explain why pulse checking is important during independent exercise;Long Term: Able to check pulse independently and accurately Short Term: Able to explain why pulse checking is important during  independent exercise;Long Term: Able to check pulse independently and accurately      Understanding of Exercise Prescription Yes Yes      Intervention Provide education, explanation, and written materials on patient's individual exercise prescription Provide education, explanation, and written materials on patient's individual exercise prescription      Expected Outcomes Short Term: Able to explain program exercise prescription;Long Term: Able to explain home exercise prescription to exercise independently Short Term: Able to explain program exercise prescription;Long Term: Able to explain home exercise prescription to exercise independently             Exercise Goals Re-Evaluation :  Exercise Goals Re-Evaluation    [place] Name 11/26/19 1428             Exercise Goal Re-Evaluation  Exercise Goals Review Increase Physical Activity;Increase Strength and Stamina;Able to understand and use rate of perceived exertion (RPE) scale;Knowledge and understanding of Target Heart Rate Range (THRR);Able to check pulse independently;Understanding of Exercise Prescription       Comments Pt has attended 5 exercise sessions. During his last session, he was found to be in newly onset atrial fibrillation. He has been advised to not return to rehab by his MD until his follow up visit on 8/4. Before this, he was progressing well and had a positive outlook. He currently exercises at 4.3 METs on the elliptical. Will continue to monitor and progress as able.       Expected Outcomes Pt will continue to progress towards his goals and begin a home exercise program.               Discharge Exercise Prescription (Final Exercise Prescription Changes):  Exercise Prescription Changes - 11/14/19 1329      Response to Exercise   Blood Pressure (Admit) 124/82    Blood Pressure (Exercise) 140/80    Blood Pressure (Exit) 126/80    Heart Rate (Admit) 85 bpm    Heart Rate (Exercise) 106 bpm    Heart Rate (Exit) 94 bpm     Rating of Perceived Exertion (Exercise) 13    Duration Continue with 30 min of aerobic exercise without signs/symptoms of physical distress.    Intensity THRR unchanged      Progression   Progression Continue to progress workloads to maintain intensity without signs/symptoms of physical distress.      Resistance Training   Training Prescription Yes    Weight 3    Reps 10-15      Treadmill   MPH 2.5    Grade 0    Minutes 22    METs 2.91      Recumbant Elliptical   Level 1    RPM 53    Minutes 17    METs 4.3           Nutrition:  Target Goals: Understanding of nutrition guidelines, daily intake of sodium 1500mg , cholesterol 200mg , calories 30% from fat and 7% or less from saturated fats, daily to have 5 or more servings of fruits and vegetables.  Biometrics:  Pre Biometrics - 11/02/19 1452      Pre Biometrics   Height 6\' 2"  (1.88 m)    Weight 90.9 kg    Waist Circumference 38 inches    Hip Circumference 40 inches    Waist to Hip Ratio 0.95 %    BMI (Calculated) 25.72    Triceps Skinfold 5 mm    % Body Fat 20.5 %    Grip Strength 39.6 kg    Flexibility 0 in    Single Leg Stand 2.53 seconds            Nutrition Therapy Plan and Nutrition Goals:  Nutrition Therapy & Goals - 11/26/19 1420      Personal Nutrition Goals   Comments Patient continues to say he is eating heart healthy eating a lot of fresh fruits and vegetables with no pork or beef. Will continue to monitor.      Intervention Plan   Intervention Nutrition handout(s) given to patient.           Nutrition Assessments:  Nutrition Assessments - 11/02/19 1430      MEDFICTS Scores   Pre Score 6           Nutrition Goals Re-Evaluation:   Nutrition Goals  Discharge (Final Nutrition Goals Re-Evaluation):   Psychosocial: Target Goals: Acknowledge presence or absence of significant depression and/or stress, maximize coping skills, provide positive support system. Participant is able to  verbalize types and ability to use techniques and skills needed for reducing stress and depression.  Initial Review & Psychosocial Screening:  Initial Psych Review & Screening - 11/02/19 1439      Initial Review   Current issues with None Identified      Family Dynamics   Good Support System? Yes      Barriers   Psychosocial barriers to participate in program There are no identifiable barriers or psychosocial needs.      Screening Interventions   Interventions Encouraged to exercise;Provide feedback about the scores to participant    Expected Outcomes Short Term goal: Identification and review with participant of any Quality of Life or Depression concerns found by scoring the questionnaire.;Long Term goal: The participant improves quality of Life and PHQ9 Scores as seen by post scores and/or verbalization of changes           Quality of Life Scores:  Quality of Life - 11/02/19 1459      Quality of Life   Select Quality of Life      Quality of Life Scores   Health/Function Pre 23.5 %    Socioeconomic Pre 21.71 %    Psych/Spiritual Pre 17.33 %    Family Pre 25.4 %    GLOBAL Pre 22.29 %          Scores of 19 and below usually indicate a poorer quality of life in these areas.  A difference of  2-3 points is a clinically meaningful difference.  A difference of 2-3 points in the total score of the Quality of Life Index has been associated with significant improvement in overall quality of life, self-image, physical symptoms, and general health in studies assessing change in quality of life.  PHQ-9: Recent Review Flowsheet Data    Depression screen [facility] 2/9 11/02/2019 10/03/2019 06/27/2019   Decreased Interest 0 0 0   Down, Depressed, Hopeless 0 0 0   PHQ - 2 Score 0 0 0   Altered sleeping 0 3 -   Tired, decreased energy 0 0 -   Change in appetite 0 0 -   Feeling bad or failure about yourself  0 0 -   Trouble concentrating 0 0 -   Moving slowly or fidgety/restless 0 0 -    Suicidal thoughts 0 0 -   PHQ-9 Score 0 3 -   Difficult doing work/chores Not difficult at all Not difficult at all -     Interpretation of Total Score  Total Score Depression Severity:  1-4 = Minimal depression, 5-9 = Mild depression, 10-14 = Moderate depression, 15-19 = Moderately severe depression, 20-27 = Severe depression   Psychosocial Evaluation and Intervention:  Psychosocial Evaluation - 11/02/19 1440      Psychosocial Evaluation & Interventions   Interventions Encouraged to exercise with the program and follow exercise prescription;Stress management education;Relaxation education    Comments Patient has no psychosocial issues identified at his orientation visit. His QOL score was 22.29 and his PHQ-9 score was 0. He has good family support.    Expected Outcomes Patient will have no psychosocial issues identified at discharge.    Continue Psychosocial Services  No Follow up required           Psychosocial Re-Evaluation:  Psychosocial Re-Evaluation    [place] Name 11/26/19 1419  Psychosocial Re-Evaluation   Current issues with None Identified       Comments Patient's initial QOL score was 22.29 and his PHQ-9 score was 0 with no psychosocial issues identified.       Expected Outcomes Patient will have no psychosocial issues identifed at discharge.       Interventions Stress management education;Encouraged to attend Cardiac Rehabilitation for the exercise;Relaxation education       Continue Psychosocial Services  No Follow up required              Psychosocial Discharge (Final Psychosocial Re-Evaluation):  Psychosocial Re-Evaluation - 11/26/19 1419      Psychosocial Re-Evaluation   Current issues with None Identified    Comments Patient's initial QOL score was 22.29 and his PHQ-9 score was 0 with no psychosocial issues identified.    Expected Outcomes Patient will have no psychosocial issues identifed at discharge.    Interventions Stress management  education;Encouraged to attend Cardiac Rehabilitation for the exercise;Relaxation education    Continue Psychosocial Services  No Follow up required           Vocational Rehabilitation: Provide vocational rehab assistance to qualifying candidates.   Vocational Rehab Evaluation & Intervention:  Vocational Rehab - 11/02/19 1431      Vocational Rehab Re-[place]   Comments Patient plans to return to full-time employment next week. He does not need vocational rehab.           Education: Education Goals: Education classes will be provided on a weekly basis, covering required topics. Participant will state understanding/return demonstration of topics presented.  Learning Barriers/Preferences:  Learning Barriers/Preferences - 11/02/19 1431      Learning Barriers/Preferences   Learning Barriers None    Learning Preferences Written Material;Skilled Demonstration           Education Topics: Hypertension, Hypertension Reduction -Define heart disease and high blood pressure. Discus how high blood pressure affects the body and ways to reduce high blood pressure.   Exercise and Your Heart -Discuss why it is important to exercise, the FITT principles of exercise, normal and abnormal responses to exercise, and how to exercise safely.   CARDIAC REHAB PHASE II EXERCISE from 11/07/2019 in [place]  Date 11/07/19  Educator DJ  Instruction Review Code 1- Verbalizes Understanding      Angina -Discuss definition of angina, causes of angina, treatment of angina, and how to decrease risk of having angina.   CARDIAC REHAB PHASE II EXERCISE from 11/14/2019 in [place]  Date 11/14/19  Educator [place]  Instruction Review Code 1- Verbalizes Understanding      Cardiac Medications -Review what the following cardiac medications are used for, how they affect the body, and side effects that may occur when taking the medications.  Medications  include Aspirin, Beta blockers, calcium channel blockers, ACE Inhibitors, angiotensin receptor blockers, diuretics, digoxin, and antihyperlipidemics.   Congestive Heart Failure -Discuss the definition of CHF, how to live with CHF, the signs and symptoms of CHF, and how keep track of weight and sodium intake.   Heart Disease and Intimacy -Discus the effect sexual activity has on the heart, how changes occur during intimacy as we age, and safety during sexual activity.   Smoking Cessation / COPD -Discuss different methods to quit smoking, the health benefits of quitting smoking, and the definition of COPD.   Nutrition I: Fats -Discuss the types of cholesterol, what cholesterol does to the heart, and how cholesterol levels can be controlled.  Nutrition II: Labels -Discuss the different components of food labels and how to read food label   Heart Parts/Heart Disease and PAD -Discuss the anatomy of the heart, the pathway of blood circulation through the heart, and these are affected by heart disease.   Stress I: Signs and Symptoms -Discuss the causes of stress, how stress may lead to anxiety and depression, and ways to limit stress.   Stress II: Relaxation -Discuss different types of relaxation techniques to limit stress.   Warning Signs of Stroke / TIA -Discuss definition of a stroke, what the signs and symptoms are of a stroke, and how to identify when someone is having stroke.   Knowledge Questionnaire Score:  Knowledge Questionnaire Score - 11/02/19 1431      Knowledge Questionnaire Score   Pre Score 28/28           Core Components/Risk Factors/Patient Goals at Admission:  Personal Goals and Risk Factors at Admission - 11/02/19 1431      Core Components/Risk Factors/Patient Goals on Admission    Weight Management Weight Maintenance    Lipids Yes    Intervention Provide education and support for participant on nutrition & aerobic/resistive exercise along with  prescribed medications to achieve LDL 70mg , HDL >40mg .    Expected Outcomes Short Term: Participant states understanding of desired cholesterol values and is compliant with medications prescribed. Participant is following exercise prescription and nutrition guidelines.    Personal Goal Other Yes    Personal Goal Get his heart healthy and be health overall.    Intervention Patient will attend CR 3 days/week and supplement with exercise at home 2 days/week.    Expected Outcomes Patient will meet both program and personal goals.           Core Components/Risk Factors/Patient Goals Review:   Goals and Risk Factor Review    Row Name 11/26/19 1415             Core Components/Risk Factors/Patient Goals Review   Personal Goals Review Weight Management/Obesity  Get heart healthy and strong; get healthier overall.       Review Patient has completed 5 complete sessions maintaining his weight. He was doing well in the program. On his 6 sessions, he was in A-Fib from the onset with a HR of 140-145. He was sent for a ECG whic confirmed A-Fib with rapid ventricular response. He was advised by his cardiologist Dr. [name] not to return to CR until he was seen by cardiology 11/28/19. Will continue to monitor.       Expected Outcomes Patient will continue to attend sessions and complete the program meeting both program and personal goals.              Core Components/Risk Factors/Patient Goals at Discharge (Final Review):   Goals and Risk Factor Review - 11/26/19 1415      Core Components/Risk Factors/Patient Goals Review   Personal Goals Review Weight Management/Obesity   Get heart healthy and strong; get healthier overall.   Review Patient has completed 5 complete sessions maintaining his weight. He was doing well in the program. On his 6 sessions, he was in A-Fib from the onset with a HR of 140-145. He was sent for a ECG whic confirmed A-Fib with rapid ventricular response. He was advised by his  cardiologist Dr. [name] not to return to CR until he was seen by cardiology 11/28/19. Will continue to monitor.    Expected Outcomes Patient will continue to attend sessions and complete  the program meeting both program and personal goals.           ITP Comments:   Comments: ITP REVIEW Pt is making expected progress toward Cardiac Rehab goals after completing 5 sessions. Patient has had recent onset of A-Fib and is being evaluated by his cardiologist before he returns to CR. Will continue to monitor.

## 2019-11-28 NOTE — Progress Notes (Signed)
Pt in office for EKG  

## 2019-11-30 ENCOUNTER — Telehealth (HOSPITAL_COMMUNITY): Payer: Self-pay | Admitting: *Deleted

## 2019-11-30 ENCOUNTER — Encounter (HOSPITAL_COMMUNITY): Payer: BC Managed Care – PPO

## 2019-11-30 NOTE — Telephone Encounter (Signed)
Called and spoke to pt.  Advised him that Dr. [name] had given him the okay to return to exercise at Cardiac rehab.  Pt verbalized understanding and reports feeling better.  Pt has upcoming follow up on 8/18. Cherre Huger, BSN Cardiac and Training and development officer

## 2019-11-30 NOTE — Telephone Encounter (Signed)
-----   Message from Satira Sark, MD sent at 11/30/2019 10:41 AM EDT ----- Regarding: RE: okay to return to Cardiac Rehab/AP Just checked inbox prior to leaving. If he is doing well on medications, he can resume cardiac rehab and likely return to prior work duties pending follow-up. ----- Message ----- From: Rowe Pavy, RN Sent: 11/30/2019   9:58 AM EDT To: Satira Sark, MD, # Subject: RE: okay to return to [place] sorry [place] !!- thought I had attached him to the first message ----- Message ----- From: Erma Heritage, PA-C Sent: 11/30/2019   8:55 AM EDT To: Rowe Pavy, RN Subject: RE: okay to return to [place],   Dr. [name] is out on vacation this week. I would be happy to help but no patient is attached to this message so I am unsure who you are referring to. Can you please resend with the patient attached or include his MRN?  Thanks,  [place]  ----- Message ----- From: Rowe Pavy, RN Sent: 11/30/2019   8:18 AM EDT To: Satira Sark, MD Subject: okay to return to Cardiac Rehab/AP             Dr. [name],  The above pt is holding cardiac rehab until EKG completed in the office on yesterday.  EKG showed SR with PVC.  Plan to continue current medications.  He has a follow up with you scheduled for 8/18.  May Gerald Stabs return to exercise or continue to hold until seen by you?   Thanks so much for your valued input Psychologist, clinical, BSN Cardiac and Pulmonary Rehab Nurse Navigator  [place]

## 2019-12-03 ENCOUNTER — Encounter (HOSPITAL_COMMUNITY): Payer: BC Managed Care – PPO

## 2019-12-05 ENCOUNTER — Encounter (HOSPITAL_COMMUNITY): Payer: BC Managed Care – PPO

## 2019-12-07 ENCOUNTER — Encounter (HOSPITAL_COMMUNITY): Payer: BC Managed Care – PPO

## 2019-12-10 ENCOUNTER — Encounter (HOSPITAL_COMMUNITY)
Admission: RE | Admit: 2019-12-10 | Discharge: 2019-12-10 | Disposition: A | Discharge status: Home / Self Care | Payer: BC Managed Care – PPO | Source: Ambulatory Visit | Attending: Cardiology | Admitting: Cardiology

## 2019-12-10 ENCOUNTER — Other Ambulatory Visit: Payer: Self-pay

## 2019-12-10 DIAGNOSIS — Z952 Presence of prosthetic heart valve: Secondary | ICD-10-CM | POA: Diagnosis not present

## 2019-12-10 NOTE — Progress Notes (Signed)
Daily Session Note  Patient Details  Name: LANGLEY INGALLS MRN: [id number] Date of Birth: [birth year] Referring Provider:     CARDIAC REHAB PHASE II ORIENTATION from 11/02/2019 in [place]  Referring Provider [name]      Encounter Date: 12/10/2019  Check In:  Session Check In - 12/10/19 0930      Check-In   Supervising physician immediately available to respond to emergencies See telemetry face sheet for immediately available MD    Location [facility]    Staff Present Algis Downs, Exercise Physiologist;Dalton Kris Mouton, MS, ACSM-CEP, Exercise Physiologist    Virtual Visit No    Medication changes reported     No    Fall or balance concerns reported    No    Tobacco Cessation No Change    Resistance Training Performed Yes    VAD Patient? No    PAD/SET Patient? No      Pain Assessment   Currently in Pain? No/denies    Pain Score 0-No pain    Multiple Pain Sites No           Capillary Blood Glucose: No results found for this or any previous visit (from the past 24 hour(s)).    Social History   Tobacco Use  Smoking Status Former Smoker  . Packs/day: 0.25  . Types: Cigarettes  . Quit date: 08/02/2019  . Years since quitting: 0.3  Smokeless Tobacco Never Used  Tobacco Comment   Cutting down < 5 cig / day    Goals Met:  Independence with exercise equipment Exercise tolerated well No report of cardiac concerns or symptoms Strength training completed today  Goals Unmet:  Not Applicable  Comments: Check out: 1030   Dr. Kathie Dike is Medical Director for [facility] Pulmonary Re[facility].

## 2019-12-12 ENCOUNTER — Other Ambulatory Visit: Payer: Self-pay

## 2019-12-12 ENCOUNTER — Encounter: Payer: Self-pay | Admitting: Student

## 2019-12-12 ENCOUNTER — Encounter: Payer: Self-pay | Admitting: *Deleted

## 2019-12-12 ENCOUNTER — Ambulatory Visit (INDEPENDENT_AMBULATORY_CARE_PROVIDER_SITE_OTHER): Payer: BC Managed Care – PPO | Admitting: Student

## 2019-12-12 ENCOUNTER — Encounter (HOSPITAL_COMMUNITY)
Admission: RE | Admit: 2019-12-12 | Discharge: 2019-12-12 | Disposition: A | Discharge status: Home / Self Care | Payer: BC Managed Care – PPO | Source: Ambulatory Visit | Attending: Cardiology | Admitting: Cardiology

## 2019-12-12 VITALS — BP 114/60 | HR 92 | Ht 74.0 in | Wt 198.0 lb

## 2019-12-12 DIAGNOSIS — I480 Paroxysmal atrial fibrillation: Secondary | ICD-10-CM | POA: Diagnosis not present

## 2019-12-12 DIAGNOSIS — I351 Nonrheumatic aortic (valve) insufficiency: Secondary | ICD-10-CM

## 2019-12-12 DIAGNOSIS — Z952 Presence of prosthetic heart valve: Secondary | ICD-10-CM | POA: Diagnosis not present

## 2019-12-12 DIAGNOSIS — I429 Cardiomyopathy, unspecified: Secondary | ICD-10-CM | POA: Diagnosis not present

## 2019-12-12 DIAGNOSIS — R519 Headache, unspecified: Secondary | ICD-10-CM

## 2019-12-12 MED ORDER — METOPROLOL SUCCINATE ER 50 MG PO TB24
50.0000 mg | ORAL_TABLET | Freq: Every day | ORAL | 1 refills | Status: DC
Start: 2019-12-12 — End: 2020-05-21

## 2019-12-12 MED ORDER — APIXABAN 5 MG PO TABS
5.0000 mg | ORAL_TABLET | Freq: Two times a day (BID) | ORAL | 6 refills | Status: DC
Start: 2019-12-12 — End: 2020-01-16

## 2019-12-12 NOTE — Progress Notes (Signed)
Daily Session Note  Patient Details  Name: Donald [name] MRN: [id number] Date of Birth: [birth year] Referring Provider:     CARDIAC REHAB PHASE II ORIENTATION from 11/02/2019 in [place]  Referring Provider [name]      Encounter Date: 12/12/2019  Check In:  Session Check In - 12/12/19 1007      Check-In   Supervising physician immediately available to respond to emergencies See telemetry face sheet for immediately available MD    Location [facility]    Staff Present Hoy Register, MS, ACSM-CEP, Exercise Physiologist;Debra Wynetta Emery, RN, BSN    Virtual Visit No    Medication changes reported     No    Fall or balance concerns reported    No    Tobacco Cessation No Change    Warm-up and Cool-down Performed on first and last piece of equipment    Resistance Training Performed Yes    VAD Patient? No    PAD/SET Patient? No      Pain Assessment   Currently in Pain? No/denies    Pain Score 0-No pain    Multiple Pain Sites No           Capillary Blood Glucose: No results found for this or any previous visit (from the past 24 hour(s)).    Social History   Tobacco Use  Smoking Status Former Smoker  . Packs/day: 0.25  . Types: Cigarettes  . Quit date: 08/02/2019  . Years since quitting: 0.3  Smokeless Tobacco Never Used  Tobacco Comment   Cutting down < 5 cig / day    Goals Met:  Independence with exercise equipment Exercise tolerated well No report of cardiac concerns or symptoms Strength training completed today  Goals Unmet:  Not Applicable  Comments: checkout time is 1030   Dr. Kathie Dike is Medical Director for [facility] Pulmonary Rehab.

## 2019-12-12 NOTE — Patient Instructions (Signed)
Medication Instructions:  Your physician recommends that you continue on your current medications as directed. Please refer to the Current Medication list given to you today.  *If you need a refill on your cardiac medications before your next appointment, please call your pharmacy*   Lab Work: NONE   If you have labs (blood work) drawn today and your tests are completely normal, you will receive your results only by: Marland Kitchen MyChart Message (if you have MyChart) OR . A paper copy in the mail If you have any lab test that is abnormal or we need to change your treatment, we will call you to review the results.   Testing/Procedures: NONE    Follow-Up: At [facility], you and your health needs are our priority.  As part of our continuing mission to provide you with exceptional heart care, we have created designated Provider Care Teams.  These Care Teams include your primary Cardiologist (physician) and Advanced Practice Providers (APPs -  Physician Assistants and Nurse Practitioners) who all work together to provide you with the care you need, when you need it.  We recommend signing up for the patient portal called "MyChart".  Sign up information is provided on this After Visit Summary.  MyChart is used to connect with patients for Virtual Visits (Telemedicine).  Patients are able to view lab/test results, encounter notes, upcoming appointments, etc.  Non-urgent messages can be sent to your provider as well.   To learn more about what you can do with MyChart, go to [email or web address].    Your next appointment:   3 month(s)  The format for your next appointment:   In Person  Provider:   Rozann Lesches, MD   Other Instructions Thank you for choosing [facility]!

## 2019-12-12 NOTE — Progress Notes (Signed)
Cardiology Office Note    Date:  12/12/2019   ID:  Donald [name], DOB [birth year], MRN [id number]  PCP:  Donald Mayo, NP  Cardiologist: Donald Lesches, MD    Chief Complaint  Patient presents with  . Follow-up    recent atrial fibrillation    History of Present Illness:    Donald [name] is a 54 y.o. male with past medical history of severe AI (s/p AVR in 07/2019 with  Donald [name] Inspiris Resilia stented bovine pericardial tissue valve), secondary cardiomyopathy (EF 30-35% by echo in 06/2019, improved to 45-50% by repeat imaging in 09/2019) and HLD who presents to the office today for follow-up.   He was last examined by Dr. Domenic [name] in 10/2019 and he denied any recent chest pain or dyspnea on exertion at that time. He did report having headaches and was questioning if that was secondary to Metoprolol. While participatibng in cardiac rehab, he was noted to have atrial fibrillation on 11/16/2019 confirmed by an EKG in the office. Given his CHA2DS2-VASc Score of 2, Toprol-XL was increased to 50 mg daily and ASA was switched to Eliquis 5 mg twice daily. Repeat EKG on 11/28/2019 showed he was back in NSR.   In talking with the patient today, he reports that he did feel dizzy when he was in atrial fibrillation but did not experience specific palpitations. He reports his dizziness resolved once back in normal sinus rhythm. He denies any recent chest pain or dyspnea on exertion. No recent orthopnea, PND or lower extremity edema. He has resumed his usual activities at work and reports walking over 9 miles a day without symptoms.  He reports good compliance with Eliquis 5 mg twice daily. No reports of active bleeding.   Past Medical History:  Diagnosis Date  . Bone tumor 11/22/2013  . Cardiomyopathy (Donald [name])    a. EF 30 to 35% by echo in 06/2019 in the setting of severe AI  . Encounter for screening for malignant neoplasm of colon 06/27/2019  . Glenoid labral tear 10/27/2010  . Hyperlipidemia   .  Rotator cuff tear, right 10/27/2010  . S/P aortic valve replacement with bioprosthetic valve 08/09/2019   29 mm Edwards Inspiris Resilia stented bovine pericardial tissue valve  . S/P patent foramen ovale closure 08/09/2019  . Shoulder injury 10/27/2010    Past Surgical History:  Procedure Laterality Date  . AORTIC VALVE REPLACEMENT N/A 08/09/2019   Procedure: AORTIC VALVE REPLACEMENT (AVR) using Donald [name] Resilia 29 MM Aortic Valve.;  Surgeon: Donald Alberts, MD;  Location: [place];  Service: Open Heart Surgery;  Laterality: N/A;  . BACK SURGERY     multiple back surgery  . Benign tumor     Left shin  . BUBBLE STUDY  07/05/2019   Procedure: BUBBLE STUDY;  Surgeon: Donald Sark, MD;  Location: [facility];  Service: Cardiovascular;;  . HERNIA REPAIR    . REPAIR OF PATENT FORAMEN OVALE N/A 08/09/2019   Procedure: Closure Of Patent Foramen Ovale;  Surgeon: Donald Alberts, MD;  Location: [facility] [place];  Service: Open Heart Surgery;  Laterality: N/A;  . RIGHT/LEFT HEART CATH AND CORONARY ANGIOGRAPHY N/A 07/26/2019   Procedure: RIGHT/LEFT HEART CATH AND CORONARY ANGIOGRAPHY;  Surgeon: Burnell Blanks, MD;  Location: [place];  Service: Cardiovascular;  Laterality: N/A;  . SHOULDER SURGERY Left   . SHOULDER SURGERY Right   . TEE WITHOUT CARDIOVERSION N/A 07/05/2019   Procedure: TRANSESOPHAGEAL ECHOCARDIOGRAM (TEE) WITH PROPOFOL;  Surgeon:  Donald Sark, MD;  Location: [facility];  Service: Cardiovascular;  Laterality: N/A;  . TEE WITHOUT CARDIOVERSION N/A 08/09/2019   Procedure: TRANSESOPHAGEAL ECHOCARDIOGRAM (TEE);  Surgeon: Donald Alberts, MD;  Location: [place];  Service: Open Heart Surgery;  Laterality: N/A;  . Thumb surgery Left x3    Current Medications: Outpatient Medications Prior to Visit  Medication Sig Dispense Refill  . allopurinol (ZYLOPRIM) 100 MG tablet Take 1 tablet (100 mg total) by mouth daily. 30 tablet 6  . atorvastatin (LIPITOR) 10 MG tablet Take 10 mg by  mouth every evening.     . Cholecalciferol (VITAMIN D3) 50 MCG (2000 UT) TABS Take 2,000 Units by mouth daily.    . Coenzyme Q10 (COQ10) 100 MG CAPS Take 100 mg by mouth daily.    . diclofenac Sodium (VOLTAREN) 1 % GEL Apply 2 g topically 2 (two) times daily as needed. Rub into affected area of foot 2 times daily as needed 100 g 2  . Multiple Vitamin (MULTIVITAMIN WITH MINERALS) TABS tablet Take 1 tablet by mouth daily.    . Omega-3 Fatty Acids (FISH OIL PO) Take 2,000 mg by mouth daily.     . Probiotic Product (ACIDOPHILUS SUPER PROBIOTIC PO) Take 1 tablet by mouth daily.    Marland Kitchen apixaban (ELIQUIS) 5 MG TABS tablet Take 1 tablet (5 mg total) by mouth 2 (two) times daily. 60 tablet 6  . metoprolol succinate (TOPROL XL) 50 MG 24 hr tablet Take 1 tablet (50 mg total) by mouth daily. Take with or immediately following a meal. 90 tablet 1  . acetaminophen (TYLENOL) 325 MG tablet Take 650 mg by mouth every 6 (six) hours as needed for mild pain. (Patient not taking: Reported on 11/02/2019)    . gabapentin (NEURONTIN) 300 MG capsule Take 1 capsule (300 mg total) by mouth at bedtime. 90 capsule 0   No facility-administered medications prior to visit.     Allergies:   Ultram [tramadol hcl]   Social History   Socioeconomic History  . Marital status: Married    Spouse name: Donald [name]   . Number of children: 4  . Years of education: Not on file  . Highest education level: Some college, no degree  Occupational History  . Occupation: Nurse, adult: [employer]  Tobacco Use  . Smoking status: Former Smoker    Packs/day: 0.25    Types: Cigarettes    Quit date: 08/02/2019    Years since quitting: 0.3  . Smokeless tobacco: Never Used  . Tobacco comment: Cutting down < 5 cig / day  Vaping Use  . Vaping Use: Never used  Substance and Sexual Activity  . Alcohol use: Yes    Alcohol/week: 12.0 standard drinks    Types: 12 Shots of liquor per week    Comment: on Friday and Saturday  .  Drug use: No  . Sexual activity: Yes    Birth control/protection: Surgical  Other Topics Concern  . Not on file  Social History Narrative   Live Donald [name] wife of 2 years, previously was widowed from first wife from cancer 01/2011      Four children      Enjoy: golf, target shooting       Diet:    Caffeine: 16-20 oz coffee    Water: 6-7 bottles       Wears seat belt    Smoke detectors at home    Does not use phone while driving  Social Determinants of Health   Financial Resource Strain:   . Difficulty of Paying Living Expenses:   Food Insecurity:   . Worried About Charity fundraiser in the Last Year:   . Arboriculturist in the Last Year:   Transportation Needs:   . Film/video editor (Medical):   Marland Kitchen Lack of Transportation (Non-Medical):   Physical Activity:   . Days of Exercise per Week:   . Minutes of Exercise per Session:   Stress:   . Feeling of Stress :   Social Connections:   . Frequency of Communication with Friends and Family:   . Frequency of Social Gatherings with Friends and Family:   . Attends Religious Services:   . Active Member of Clubs or Organizations:   . Attends Archivist Meetings:   Marland Kitchen Marital Status:      Family History:  The patient's family history includes Other in his father.   Review of Systems:   Please see the history of present illness.     General:  No chills, fever, night sweats or weight changes.  Cardiovascular:  No chest pain, dyspnea on exertion, edema, orthopnea, palpitations, paroxysmal nocturnal dyspnea. Dermatological: No rash, lesions/masses Respiratory: No cough, dyspnea Urologic: No hematuria, dysuria Abdominal:   No nausea, vomiting, diarrhea, bright red blood per rectum, melena, or hematemesis Neurologic:  No visual changes, wkns, changes in mental status. Positive for headaches.   All other systems reviewed and are otherwise negative except as noted above.   Physical Exam:    VS:  BP 114/60   Pulse  92   Ht 6\' 2"  (1.88 m)   Wt 198 lb (89.8 kg)   SpO2 97%   BMI 25.42 kg/m    General: Well developed, well nourished,male appearing in no acute distress. Head: Normocephalic, atraumatic. Neck: No carotid bruits. JVD not elevated.  Lungs: Respirations regular and unlabored, without wheezes or rales.  Heart: Regular rate and rhythm. No S3 or S4.  No murmur, no rubs, or gallops appreciated. Abdomen: Appears non-distended. No obvious abdominal masses. Msk:  Strength and tone appear normal for age. No obvious joint deformities or effusions. Extremities: No clubbing or cyanosis. No lower extremity edema.  Distal pedal pulses are 2+ bilaterally. Neuro: Alert and oriented X 3. Moves all extremities spontaneously. No focal deficits noted. Psych:  Responds to questions appropriately with a normal affect. Skin: No rashes or lesions noted  Wt Readings from Last 3 Encounters:  12/12/19 198 lb (89.8 kg)  11/16/19 199 lb (90.3 kg)  11/14/19 200 lb 9.9 oz (91 kg)     Studies/Labs Reviewed:   EKG:  EKG is not ordered today.  EKG from 11/28/2019 is reviewed and shows NSR, HR 81 with PVC's and known RBBB.  Recent Labs: 08/10/2019: Magnesium 2.2 10/03/2019: ALT 17; BUN 14; Creat 0.78; Hemoglobin 14.8; Platelets 207; Potassium 4.9; Sodium 139   Lipid Panel    Component Value Date/Time   CHOL 174 06/28/2019 0833   TRIG 375 (H) 06/28/2019 0833   HDL 53 06/28/2019 0833   CHOLHDL 3.3 06/28/2019 0833   LDLCALC 76 06/28/2019 0833    Additional studies/ records that were reviewed today include:   Cardiac Catheterization: 07/2019  Prox RCA lesion is 20% stenosed.  Dist RCA lesion is 10% stenosed.  3rd Mrg lesion is 10% stenosed.  Prox LAD to Mid LAD lesion is 20% stenosed.  1st Diag lesion is 30% stenosed.   1. Mild non-obstructive CAD 2. Severe  aortic valve insufficiency.   Recommendations: Continue planning for aortic valve repair.  Echocardiogram: 09/2019 IMPRESSIONS    1. Right  ventricular systolic function is normal. The right ventricular  size is normal.  2. Anteroseptal hypokinesis consistent with prior cardiac surgery. . Left  ventricular ejection fraction, by estimation, is 45 to 50%. The left  ventricle has mildly decreased function. The left ventricle demonstrates  regional wall motion abnormalities  (see scoring diagram/findings for description). There is moderate left  ventricular hypertrophy. Left ventricular diastolic parameters are  consistent with Grade I diastolic dysfunction (impaired relaxation).  3. The mitral valve is normal in structure. No evidence of mitral valve  regurgitation. No evidence of mitral stenosis.  4. Edwards Inspiris Resilia stented bovine pericardial tissue valve size  29 mm in AV position. Normal function. . The aortic valve has been  repaired/replaced. Aortic valve regurgitation is not visualized. No aortic  stenosis is present.  5. The inferior vena cava is normal in size with greater than 50%  respiratory variability, suggesting right atrial pressure of 3 mmHg.   Assessment:    1. Severe aortic insufficiency   2. Secondary cardiomyopathy ([place])   3. PAF (paroxysmal atrial fibrillation) ([place])   4. Frequent headaches      Plan:   In order of problems listed above:  1. Severe AI - He is s/p AVR in 07/2019 with Edwards Inspiris Resilia stented bovine pericardial tissue valve. Most recent echocardiogram in 09/2019 showed his aortic valve was functioning normally with no evidence of stenosis or regurgitation. Will continue to follow.   2. Secondary Cardiomyopathy - His EF was previously 30-35% by echo in 06/2019 in the setting of severe AI, improved to 45-50% by repeat imaging in 09/2019.  - He denies any recent orthopnea, PND or lower extremity edema. Appears euvolemic by examination today. - Continue Toprol-XL 50 mg daily.  3. Paroxysmal Atrial Fibrillation - This was diagnosed during a recent session of cardiac  rehab and he has since converted back to normal sinus rhythm and is maintaining normal sinus rhythm by examination today.  Will continue Toprol-XL 50 mg daily for rate control. - He has been started on Eliquis 5 mg twice daily for anticoagulation and denies any evidence of active bleeding. Continue with anticoagulation at this time.  4. Headaches - He does report headaches that originate along the occipital region of his scalp and can last for several minutes to hours. He has a history of migraines but denies any recent auras and reports his current headaches feel differently. Was initially felt this could be secondary to Metoprolol but his headaches actually resolved for several weeks after starting the higher dosing. I did encourage him to review this with his PCP. If symptoms persist, would likely benefit from a Head CT and possible Neurology referral.    Medication Adjustments/Labs and Tests Ordered: Current medicines are reviewed at length with the patient today.  Concerns regarding medicines are outlined above.  Medication changes, Labs and Tests ordered today are listed in the Patient Instructions below. Patient Instructions  Medication Instructions:  Your physician recommends that you continue on your current medications as directed. Please refer to the Current Medication list given to you today.  *If you need a refill on your cardiac medications before your next appointment, please call your pharmacy*   Lab Work: NONE   If you have labs (blood work) drawn today and your tests are completely normal, you will receive your results only by: Marland Kitchen MyChart Message (  if you have MyChart) OR . A paper copy in the mail If you have any lab test that is abnormal or we need to change your treatment, we will call you to review the results.   Testing/Procedures: NONE    Follow-Up: At [facility], you and your health needs are our priority.  As part of our continuing mission to provide you with  exceptional heart care, we have created designated Provider Care Teams.  These Care Teams include your primary Cardiologist (physician) and Advanced Practice Providers (APPs -  Physician Assistants and Nurse Practitioners) who all work together to provide you with the care you need, when you need it.  We recommend signing up for the patient portal called "MyChart".  Sign up information is provided on this After Visit Summary.  MyChart is used to connect with patients for Virtual Visits (Telemedicine).  Patients are able to view lab/test results, encounter notes, upcoming appointments, etc.  Non-urgent messages can be sent to your provider as well.   To learn more about what you can do with MyChart, go to [email or web address].    Your next appointment:   3 month(s)  The format for your next appointment:   In Person  Provider:   Rozann Lesches, MD   Other Instructions Thank you for choosing [facility]!       Signed, Erma Heritage, PA-C  12/12/2019 5:19 PM    [place] [facility] [address] [place], [place] [address] Phone: [phone] Fax: [phone]

## 2019-12-14 ENCOUNTER — Encounter (HOSPITAL_COMMUNITY)
Admission: RE | Admit: 2019-12-14 | Discharge: 2019-12-14 | Disposition: A | Discharge status: Home / Self Care | Payer: BC Managed Care – PPO | Source: Ambulatory Visit | Attending: Cardiology | Admitting: Cardiology

## 2019-12-14 ENCOUNTER — Other Ambulatory Visit: Payer: Self-pay

## 2019-12-14 DIAGNOSIS — Z952 Presence of prosthetic heart valve: Secondary | ICD-10-CM

## 2019-12-14 NOTE — Progress Notes (Signed)
Daily Session Note  Patient Details  Name: Donald [name] MRN: [id number] Date of Birth: 12-31-1965 Referring Provider:     CARDIAC REHAB PHASE II ORIENTATION from 11/02/2019 in [place]  Referring Provider [name]      Encounter Date: 12/14/2019  Check In:  Session Check In - 12/14/19 1010      Check-In   Supervising physician immediately available to respond to emergencies See telemetry face sheet for immediately available MD    Location [facility]    Staff Present Hoy Register, MS, ACSM-CEP, Exercise Physiologist;Debra Wynetta Emery, RN, BSN    Virtual Visit No    Medication changes reported     No    Fall or balance concerns reported    No    Tobacco Cessation No Change    Warm-up and Cool-down Performed as group-led instruction    VAD Patient? No    PAD/SET Patient? No      Pain Assessment   Currently in Pain? No/denies    Pain Score 0-No pain    Multiple Pain Sites No           Capillary Blood Glucose: No results found for this or any previous visit (from the past 24 hour(s)).    Social History   Tobacco Use  Smoking Status Former Smoker  . Packs/day: 0.25  . Types: Cigarettes  . Quit date: 08/02/2019  . Years since quitting: 0.3  Smokeless Tobacco Never Used  Tobacco Comment   Cutting down < 5 cig / day    Goals Met:  Independence with exercise equipment Exercise tolerated well No report of cardiac concerns or symptoms Strength training completed today  Goals Unmet:  Not Applicable  Comments: checkout time is 1030   Dr. Kathie Dike is Medical Director for [facility].

## 2019-12-17 ENCOUNTER — Encounter (HOSPITAL_COMMUNITY)
Admission: RE | Admit: 2019-12-17 | Discharge: 2019-12-17 | Disposition: A | Discharge status: Home / Self Care | Payer: BC Managed Care – PPO | Source: Ambulatory Visit | Attending: Cardiology | Admitting: Cardiology

## 2019-12-17 ENCOUNTER — Other Ambulatory Visit: Payer: Self-pay

## 2019-12-17 VITALS — Wt 198.0 lb

## 2019-12-17 DIAGNOSIS — Z952 Presence of prosthetic heart valve: Secondary | ICD-10-CM | POA: Diagnosis not present

## 2019-12-17 NOTE — Progress Notes (Signed)
Daily Session Note  Patient Details  Name: Donald [name] MRN: [id number] Date of Birth: 15-Mar-1966 Referring Provider:     CARDIAC REHAB PHASE II ORIENTATION from 11/02/2019 in [place]  Referring Provider [name]      Encounter Date: 12/17/2019  Check In:  Session Check In - 12/17/19 3013      Check-In   Supervising physician immediately available to respond to emergencies See telemetry face sheet for immediately available MD    Location [facility]    Staff Present Hoy Register, MS, ACSM-CEP, Exercise Physiologist;Debra Wynetta Emery, RN, BSN    Virtual Visit No    Medication changes reported     No    Fall or balance concerns reported    No    Tobacco Cessation No Change    Warm-up and Cool-down Performed as group-led instruction    Resistance Training Performed Yes    VAD Patient? No    PAD/SET Patient? No      Pain Assessment   Currently in Pain? No/denies    Pain Score 0-No pain    Multiple Pain Sites No           Capillary Blood Glucose: No results found for this or any previous visit (from the past 24 hour(s)).    Social History   Tobacco Use  Smoking Status Former Smoker  . Packs/day: 0.25  . Types: Cigarettes  . Quit date: 08/02/2019  . Years since quitting: 0.3  Smokeless Tobacco Never Used  Tobacco Comment   Cutting down < 5 cig / day    Goals Met:  Independence with exercise equipment Exercise tolerated well No report of cardiac concerns or symptoms Strength training completed today  Goals Unmet:  Not Applicable  Comments: checkout time is 1030   Dr. Kathie Dike is Medical Director for [facility].

## 2019-12-19 ENCOUNTER — Encounter (HOSPITAL_COMMUNITY): Payer: BC Managed Care – PPO

## 2019-12-21 ENCOUNTER — Encounter (HOSPITAL_COMMUNITY): Payer: BC Managed Care – PPO

## 2019-12-24 ENCOUNTER — Other Ambulatory Visit: Payer: Self-pay

## 2019-12-24 ENCOUNTER — Encounter (HOSPITAL_COMMUNITY)
Admission: RE | Admit: 2019-12-24 | Discharge: 2019-12-24 | Disposition: A | Discharge status: Home / Self Care | Payer: BC Managed Care – PPO | Source: Ambulatory Visit | Attending: Cardiology | Admitting: Cardiology

## 2019-12-24 DIAGNOSIS — Z952 Presence of prosthetic heart valve: Secondary | ICD-10-CM

## 2019-12-24 NOTE — Progress Notes (Signed)
Daily Session Note  Patient Details  Name: RAUN ROUTH MRN: [id number] Date of Birth: [birth year] Referring Provider:     CARDIAC REHAB PHASE II ORIENTATION from 11/02/2019 in [place]  Referring Provider [name]      Encounter Date: 12/24/2019  Check In:  Session Check In - 12/24/19 0941      Check-In   Supervising physician immediately available to respond to emergencies See telemetry face sheet for immediately available MD    Location [facility]    Staff Present Hoy Register, MS, ACSM-CEP, Exercise Physiologist;Debra Wynetta Emery, RN, BSN    Virtual Visit No    Medication changes reported     No    Fall or balance concerns reported    No    Tobacco Cessation No Change    Warm-up and Cool-down Performed as group-led instruction    Resistance Training Performed Yes    VAD Patient? No    PAD/SET Patient? No      Pain Assessment   Currently in Pain? No/denies    Pain Score 0-No pain    Multiple Pain Sites No           Capillary Blood Glucose: No results found for this or any previous visit (from the past 24 hour(s)).    Social History   Tobacco Use  Smoking Status Former Smoker  . Packs/day: 0.25  . Types: Cigarettes  . Quit date: 08/02/2019  . Years since quitting: 0.3  Smokeless Tobacco Never Used  Tobacco Comment   Cutting down < 5 cig / day    Goals Met:  Independence with exercise equipment Exercise tolerated well No report of cardiac concerns or symptoms Strength training completed today  Goals Unmet:  N/A  Comments: checkout time is 1030   Dr. Kathie Dike is Medical Director for [facility].

## 2019-12-26 ENCOUNTER — Encounter (HOSPITAL_COMMUNITY)
Admission: RE | Admit: 2019-12-26 | Discharge: 2019-12-26 | Disposition: A | Discharge status: Home / Self Care | Payer: BC Managed Care – PPO | Source: Ambulatory Visit | Attending: Cardiology | Admitting: Cardiology

## 2019-12-26 ENCOUNTER — Other Ambulatory Visit: Payer: Self-pay

## 2019-12-26 DIAGNOSIS — Z952 Presence of prosthetic heart valve: Secondary | ICD-10-CM | POA: Diagnosis not present

## 2019-12-26 NOTE — Progress Notes (Signed)
Cardiac Individual Treatment Plan  Patient Details  Name: Donald [name] MRN: [id number] Date of Birth: 1966-03-12 Referring Provider:     CARDIAC REHAB PHASE II ORIENTATION from 11/02/2019 in [place]  Referring Provider [name]      Initial Encounter Date:    CARDIAC REHAB PHASE II ORIENTATION from 11/02/2019 in [place]  Date 11/02/19      Visit Diagnosis: S/P aortic valve replacement  Patient's Home Medications on Admission:  Current Outpatient Medications:    allopurinol (ZYLOPRIM) 100 MG tablet, Take 1 tablet (100 mg total) by mouth daily., Disp: 30 tablet, Rfl: 6   apixaban (ELIQUIS) 5 MG TABS tablet, Take 1 tablet (5 mg total) by mouth 2 (two) times daily., Disp: 60 tablet, Rfl: 6   atorvastatin (LIPITOR) 10 MG tablet, Take 10 mg by mouth every evening. , Disp: , Rfl:    Cholecalciferol (VITAMIN D3) 50 MCG (2000 UT) TABS, Take 2,000 Units by mouth daily., Disp: , Rfl:    Coenzyme Q10 (COQ10) 100 MG CAPS, Take 100 mg by mouth daily., Disp: , Rfl:    diclofenac Sodium (VOLTAREN) 1 % GEL, Apply 2 g topically 2 (two) times daily as needed. Rub into affected area of foot 2 times daily as needed, Disp: 100 g, Rfl: 2   metoprolol succinate (TOPROL XL) 50 MG 24 hr tablet, Take 1 tablet (50 mg total) by mouth daily. Take with or immediately following a meal., Disp: 90 tablet, Rfl: 1   Multiple Vitamin (MULTIVITAMIN WITH MINERALS) TABS tablet, Take 1 tablet by mouth daily., Disp: , Rfl:    Omega-3 Fatty Acids (FISH OIL PO), Take 2,000 mg by mouth daily. , Disp: , Rfl:    Probiotic Product (ACIDOPHILUS SUPER PROBIOTIC PO), Take 1 tablet by mouth daily., Disp: , Rfl:   Past Medical History: Past Medical History:  Diagnosis Date   Bone tumor 11/22/2013   Cardiomyopathy [facility])    a. EF 30 to 35% by echo in 06/2019 in the setting of severe AI   Encounter for screening for malignant neoplasm of colon 06/27/2019   Glenoid labral  tear 10/27/2010   Hyperlipidemia    Rotator cuff tear, right 10/27/2010   S/P aortic valve replacement with bioprosthetic valve 08/09/2019   29 mm Edwards Inspiris Resilia stented bovine pericardial tissue valve   S/P patent foramen ovale closure 08/09/2019   Shoulder injury 10/27/2010    Tobacco Use: Social History   Tobacco Use  Smoking Status Former Smoker   Packs/day: 0.25   Types: Cigarettes   Quit date: 08/02/2019   Years since quitting: 0.4  Smokeless Tobacco Never Used  Tobacco Comment   Cutting down < 5 cig / day    Labs: Recent Review Flowsheet Data    Labs for ITP Cardiac and Pulmonary Rehab Latest Ref Rng & Units 08/09/2019 08/09/2019 08/09/2019 08/09/2019 08/09/2019   Cholestrol <200 mg/dL - - - - -   LDLCALC mg/dL (calc) - - - - -   HDL > OR = 40 mg/dL - - - - -   Trlycerides <150 mg/dL - - - - -   Hemoglobin A1c 4.8 - 5.6 % - - - - -   PHART 7.35 - 7.45 - 7.302(L) 7.276(L) 7.336(L) 7.288(L)   PCO2ART 32 - 48 mmHg - 49.0(H) 49.7(H) 42.4 46.5   HCO3 20.0 - 28.0 mmol/L - 24.3 23.2 22.7 22.4   TCO2 22 - 32 mmol/L 26 26 25 24 24    ACIDBASEDEF  0.0 - 2.0 mmol/L - 2.0 4.0(H) 3.0(H) 5.0(H)   O2SAT % - 100.0 96.0 95.0 98.0      Capillary Blood Glucose: Lab Results  Component Value Date   GLUCAP 97 08/12/2019   GLUCAP 83 08/12/2019   GLUCAP 114 (H) 08/11/2019   GLUCAP 112 (H) 08/11/2019   GLUCAP 95 08/11/2019     Exercise Target Goals: Exercise Program Goal: Individual exercise prescription set using results from initial 6 min walk test and THRR while considering  patients activity barriers and safety.   Exercise Prescription Goal: Starting with aerobic activity 30 plus minutes a day, 3 days per week for initial exercise prescription. Provide home exercise prescription and guidelines that participant acknowledges understanding prior to discharge.  Activity Barriers & Risk Stratification:  Activity Barriers & Cardiac Risk Stratification - 11/02/19 1446       Activity Barriers & Cardiac Risk Stratification   Activity Barriers Deconditioning    Comments Bilateral shoulder surgery and left hip has been bothering him.    Cardiac Risk Stratification High           6 Minute Walk:  6 Minute Walk    Row Name 11/02/19 1434         6 Minute Walk   Phase Initial     Distance 1300 feet     Walk Time 6 minutes     # of Rest Breaks 0     MPH 2.46     METS 4.05     RPE 9     VO2 Peak 14.18     Symptoms No     Resting HR 81 bpm     Resting BP 110/80     Resting Oxygen Saturation  96 %     Exercise Oxygen Saturation  during 6 min walk 96 %     Max Ex. HR 104 bpm     Max Ex. BP 118/82     2 Minute Post BP 130/80            Oxygen Initial Assessment:   Oxygen Re-Evaluation:   Oxygen Discharge (Final Oxygen Re-Evaluation):   Initial Exercise Prescription:  Initial Exercise Prescription - 11/02/19 1400      Date of Initial Exercise RX and Referring Provider   Date 11/02/19    Referring Provider [name]    Expected Discharge Date 02/02/20      Treadmill   MPH 1.5    Grade 0    Minutes 17    METs 2.14      Recumbant Elliptical   Level 1    RPM 60    Minutes 22    METs --      Prescription Details   Frequency (times per week) 3    Duration Progress to 30 minutes of continuous aerobic without signs/symptoms of physical distress      Intensity   THRR 40-80% of Max Heartrate 67-134    Ratings of Perceived Exertion 11-13    Perceived Dyspnea 0-4      Progression   Progression Continue progressive overload as per policy without signs/symptoms or physical distress.      Resistance Training   Training Prescription Yes    Weight 2    Reps 10-15           Perform Capillary Blood Glucose checks as needed.  Exercise Prescription Changes:   Exercise Prescription Changes    Row Name 11/14/19 1329 12/17/19 1200  Response to Exercise   Blood Pressure (Admit) 124/82 122/70      Blood Pressure  (Exercise) 140/80 134/74      Blood Pressure (Exit) 126/80 112/78      Heart Rate (Admit) 85 bpm 81 bpm      Heart Rate (Exercise) 106 bpm 101 bpm      Heart Rate (Exit) 94 bpm 86 bpm      Rating of Perceived Exertion (Exercise) 13 13      Duration Continue with 30 min of aerobic exercise without signs/symptoms of physical distress. Continue with 30 min of aerobic exercise without signs/symptoms of physical distress.      Intensity THRR unchanged THRR unchanged        Progression   Progression Continue to progress workloads to maintain intensity without signs/symptoms of physical distress. Continue to progress workloads to maintain intensity without signs/symptoms of physical distress.        Resistance Training   Training Prescription Yes Yes      Weight 3 3      Reps 10-15 10-15        Treadmill   MPH 2.5 2.6      Grade 0 3      Minutes 22 22      METs 2.91 4.06        Recumbant Elliptical   Level 1 2      RPM 53 62      Watts -- 89      Minutes 17 17      METs 4.3 4.8             Exercise Comments:   Exercise Comments    Row Name 11/02/19 1504           Exercise Comments Completed walk test with no signs or symptoms.              Exercise Goals and Review:   Exercise Goals    Row Name 11/02/19 1450 11/26/19 1428 12/24/19 1036         Exercise Goals   Increase Physical Activity Yes Yes Yes     Intervention Provide advice, education, support and counseling about physical activity/exercise needs.;Develop an individualized exercise prescription for aerobic and resistive training based on initial evaluation findings, risk stratification, comorbidities and participant's personal goals. Provide advice, education, support and counseling about physical activity/exercise needs.;Develop an individualized exercise prescription for aerobic and resistive training based on initial evaluation findings, risk stratification, comorbidities and participant's personal goals.  Provide advice, education, support and counseling about physical activity/exercise needs.;Develop an individualized exercise prescription for aerobic and resistive training based on initial evaluation findings, risk stratification, comorbidities and participant's personal goals.     Expected Outcomes Short Term: Attend rehab on a regular basis to increase amount of physical activity.;Long Term: Add in home exercise to make exercise part of routine and to increase amount of physical activity.;Long Term: Exercising regularly at least 3-5 days a week. Short Term: Attend rehab on a regular basis to increase amount of physical activity.;Long Term: Add in home exercise to make exercise part of routine and to increase amount of physical activity.;Long Term: Exercising regularly at least 3-5 days a week. Short Term: Attend rehab on a regular basis to increase amount of physical activity.;Long Term: Add in home exercise to make exercise part of routine and to increase amount of physical activity.;Long Term: Exercising regularly at least 3-5 days a week.     Increase Strength and Stamina Yes  Yes Yes     Intervention Provide advice, education, support and counseling about physical activity/exercise needs.;Develop an individualized exercise prescription for aerobic and resistive training based on initial evaluation findings, risk stratification, comorbidities and participant's personal goals. Provide advice, education, support and counseling about physical activity/exercise needs.;Develop an individualized exercise prescription for aerobic and resistive training based on initial evaluation findings, risk stratification, comorbidities and participant's personal goals. Provide advice, education, support and counseling about physical activity/exercise needs.;Develop an individualized exercise prescription for aerobic and resistive training based on initial evaluation findings, risk stratification, comorbidities and  participant's personal goals.     Expected Outcomes Short Term: Increase workloads from initial exercise prescription for resistance, speed, and METs.;Short Term: Perform resistance training exercises routinely during rehab and add in resistance training at home;Long Term: Improve cardiorespiratory fitness, muscular endurance and strength as measured by increased METs and functional capacity (6MWT) Short Term: Increase workloads from initial exercise prescription for resistance, speed, and METs.;Short Term: Perform resistance training exercises routinely during rehab and add in resistance training at home;Long Term: Improve cardiorespiratory fitness, muscular endurance and strength as measured by increased METs and functional capacity (6MWT) Short Term: Increase workloads from initial exercise prescription for resistance, speed, and METs.;Short Term: Perform resistance training exercises routinely during rehab and add in resistance training at home;Long Term: Improve cardiorespiratory fitness, muscular endurance and strength as measured by increased METs and functional capacity (6MWT)     Able to understand and use rate of perceived exertion (RPE) scale Yes Yes Yes     Intervention Provide education and explanation on how to use RPE scale Provide education and explanation on how to use RPE scale Provide education and explanation on how to use RPE scale     Expected Outcomes Short Term: Able to use RPE daily in rehab to express subjective intensity level;Long Term:  Able to use RPE to guide intensity level when exercising independently Short Term: Able to use RPE daily in rehab to express subjective intensity level;Long Term:  Able to use RPE to guide intensity level when exercising independently Short Term: Able to use RPE daily in rehab to express subjective intensity level;Long Term:  Able to use RPE to guide intensity level when exercising independently     Knowledge and understanding of Target Heart Rate  Range (THRR) Yes Yes Yes     Intervention Provide education and explanation of THRR including how the numbers were predicted and where they are located for reference Provide education and explanation of THRR including how the numbers were predicted and where they are located for reference Provide education and explanation of THRR including how the numbers were predicted and where they are located for reference     Expected Outcomes Short Term: Able to state/look up THRR;Short Term: Able to use daily as guideline for intensity in rehab;Long Term: Able to use THRR to govern intensity when exercising independently Short Term: Able to state/look up THRR;Short Term: Able to use daily as guideline for intensity in rehab;Long Term: Able to use THRR to govern intensity when exercising independently Short Term: Able to state/look up THRR;Short Term: Able to use daily as guideline for intensity in rehab;Long Term: Able to use THRR to govern intensity when exercising independently     Able to check pulse independently Yes Yes Yes     Intervention Provide education and demonstration on how to check pulse in carotid and radial arteries.;Review the importance of being able to check your own pulse for safety during independent exercise  Provide education and demonstration on how to check pulse in carotid and radial arteries.;Review the importance of being able to check your own pulse for safety during independent exercise Provide education and demonstration on how to check pulse in carotid and radial arteries.;Review the importance of being able to check your own pulse for safety during independent exercise     Expected Outcomes Short Term: Able to explain why pulse checking is important during independent exercise;Long Term: Able to check pulse independently and accurately Short Term: Able to explain why pulse checking is important during independent exercise;Long Term: Able to check pulse independently and accurately Short  Term: Able to explain why pulse checking is important during independent exercise;Long Term: Able to check pulse independently and accurately     Understanding of Exercise Prescription Yes Yes Yes     Intervention Provide education, explanation, and written materials on patient's individual exercise prescription Provide education, explanation, and written materials on patient's individual exercise prescription Provide education, explanation, and written materials on patient's individual exercise prescription     Expected Outcomes Short Term: Able to explain program exercise prescription;Long Term: Able to explain home exercise prescription to exercise independently Short Term: Able to explain program exercise prescription;Long Term: Able to explain home exercise prescription to exercise independently Short Term: Able to explain program exercise prescription;Long Term: Able to explain home exercise prescription to exercise independently            Exercise Goals Re-Evaluation :  Exercise Goals Re-Evaluation    Row Name 11/26/19 1428 12/24/19 1036           Exercise Goal Re-Evaluation   Exercise Goals Review Increase Physical Activity;Increase Strength and Stamina;Able to understand and use rate of perceived exertion (RPE) scale;Knowledge and understanding of Target Heart Rate Range (THRR);Able to check pulse independently;Understanding of Exercise Prescription Increase Physical Activity;Increase Strength and Stamina;Able to understand and use rate of perceived exertion (RPE) scale;Knowledge and understanding of Target Heart Rate Range (THRR);Able to check pulse independently;Understanding of Exercise Prescription      Comments Pt has attended 5 exercise sessions. During his last session, he was found to be in newly onset atrial fibrillation. He has been advised to not return to rehab by his MD until his follow up visit on 8/4. Before this, he was progressing well and had a positive outlook. He  currently exercises at 4.3 METs on the elliptical. Will continue to monitor and progress as able. Pt has completed 9 exercise sessions. He missed a few weeks due to a new diagnosis of Afib. He has continued to progress well even with his absences. He currently exercises at 4.7 METs on the elliptical. Will continue to monitor and progress as able.      Expected Outcomes Pt will continue to progress towards his goals and begin a home exercise program. Pt will continue to progress towards his goals and begin a home exercise program.              Discharge Exercise Prescription (Final Exercise Prescription Changes):  Exercise Prescription Changes - 12/17/19 1200      Response to Exercise   Blood Pressure (Admit) 122/70    Blood Pressure (Exercise) 134/74    Blood Pressure (Exit) 112/78    Heart Rate (Admit) 81 bpm    Heart Rate (Exercise) 101 bpm    Heart Rate (Exit) 86 bpm    Rating of Perceived Exertion (Exercise) 13    Duration Continue with 30 min of aerobic exercise without signs/symptoms  of physical distress.    Intensity THRR unchanged      Progression   Progression Continue to progress workloads to maintain intensity without signs/symptoms of physical distress.      Resistance Training   Training Prescription Yes    Weight 3    Reps 10-15      Treadmill   MPH 2.6    Grade 3    Minutes 22    METs 4.06      Recumbant Elliptical   Level 2    RPM 62    Watts 89    Minutes 17    METs 4.8           Nutrition:  Target Goals: Understanding of nutrition guidelines, daily intake of sodium 1500mg , cholesterol 200mg , calories 30% from fat and 7% or less from saturated fats, daily to have 5 or more servings of fruits and vegetables.  Biometrics:  Pre Biometrics - 11/02/19 1452      Pre Biometrics   Height 6\' 2"  (1.88 m)    Weight 90.9 kg    Waist Circumference 38 inches    Hip Circumference 40 inches    Waist to Hip Ratio 0.95 %    BMI (Calculated) 25.72     Triceps Skinfold 5 mm    % Body Fat 20.5 %    Grip Strength 39.6 kg    Flexibility 0 in    Single Leg Stand 2.53 seconds            Nutrition Therapy Plan and Nutrition Goals:  Nutrition Therapy & Goals - 12/24/19 1248      Personal Nutrition Goals   Comments Patient continues to say he is eating heart healthy eating a lot of fresh fruits and vegetables with no pork or beef. Will continue to monitor.      Intervention Plan   Intervention Nutrition handout(s) given to patient.           Nutrition Assessments:  Nutrition Assessments - 11/02/19 1430      MEDFICTS Scores   Pre Score 6           Nutrition Goals Re-Evaluation:   Nutrition Goals Discharge (Final Nutrition Goals Re-Evaluation):   Psychosocial: Target Goals: Acknowledge presence or absence of significant depression and/or stress, maximize coping skills, provide positive support system. Participant is able to verbalize types and ability to use techniques and skills needed for reducing stress and depression.  Initial Review & Psychosocial Screening:  Initial Psych Review & Screening - 11/02/19 1439      Initial Review   Current issues with None Identified      Family Dynamics   Good Support System? Yes      Barriers   Psychosocial barriers to participate in program There are no identifiable barriers or psychosocial needs.      Screening Interventions   Interventions Encouraged to exercise;Provide feedback about the scores to participant    Expected Outcomes Short Term goal: Identification and review with participant of any Quality of Life or Depression concerns found by scoring the questionnaire.;Long Term goal: The participant improves quality of Life and PHQ9 Scores as seen by post scores and/or verbalization of changes           Quality of Life Scores:  Quality of Life - 11/02/19 1459      Quality of Life   Select Quality of Life      Quality of Life Scores   Health/Function Pre 23.5 %  Socioeconomic Pre 21.71 %    Psych/Spiritual Pre 17.33 %    Family Pre 25.4 %    GLOBAL Pre 22.29 %          Scores of 19 and below usually indicate a poorer quality of life in these areas.  A difference of  2-3 points is a clinically meaningful difference.  A difference of 2-3 points in the total score of the Quality of Life Index has been associated with significant improvement in overall quality of life, self-image, physical symptoms, and general health in studies assessing change in quality of life.  PHQ-9: Recent Review Flowsheet Data    Depression screen [facility] 2/9 11/02/2019 10/03/2019 06/27/2019   Decreased Interest 0 0 0   Down, Depressed, Hopeless 0 0 0   PHQ - 2 Score 0 0 0   Altered sleeping 0 3 -   Tired, decreased energy 0 0 -   Change in appetite 0 0 -   Feeling bad or failure about yourself  0 0 -   Trouble concentrating 0 0 -   Moving slowly or fidgety/restless 0 0 -   Suicidal thoughts 0 0 -   PHQ-9 Score 0 3 -   Difficult doing work/chores Not difficult at all Not difficult at all -     Interpretation of Total Score  Total Score Depression Severity:  1-4 = Minimal depression, 5-9 = Mild depression, 10-14 = Moderate depression, 15-19 = Moderately severe depression, 20-27 = Severe depression   Psychosocial Evaluation and Intervention:  Psychosocial Evaluation - 11/02/19 1440      Psychosocial Evaluation & Interventions   Interventions Encouraged to exercise with the program and follow exercise prescription;Stress management education;Relaxation education    Comments Patient has no psychosocial issues identified at his orientation visit. His QOL score was 22.29 and his PHQ-9 score was 0. He has good family support.    Expected Outcomes Patient will have no psychosocial issues identified at discharge.    Continue Psychosocial Services  No Follow up required           Psychosocial Re-Evaluation:  Psychosocial Re-Evaluation    [place] Name 11/26/19 1419 12/24/19 1248            Psychosocial Re-Evaluation   Current issues with None Identified None Identified      Comments Patient's initial QOL score was 22.29 and his PHQ-9 score was 0 with no psychosocial issues identified. Patient continues to have no psychosocial issues identified. Will continue to monitor.      Expected Outcomes Patient will have no psychosocial issues identifed at discharge. Patient will have no psychosocial issues identifed at discharge.      Interventions Stress management education;Encouraged to attend Cardiac Rehabilitation for the exercise;Relaxation education Stress management education;Encouraged to attend Cardiac Rehabilitation for the exercise;Relaxation education      Continue Psychosocial Services  No Follow up required No Follow up required             Psychosocial Discharge (Final Psychosocial Re-Evaluation):  Psychosocial Re-Evaluation - 12/24/19 1248      Psychosocial Re-Evaluation   Current issues with None Identified    Comments Patient continues to have no psychosocial issues identified. Will continue to monitor.    Expected Outcomes Patient will have no psychosocial issues identifed at discharge.    Interventions Stress management education;Encouraged to attend Cardiac Rehabilitation for the exercise;Relaxation education    Continue Psychosocial Services  No Follow up required  Vocational Rehabilitation: Provide vocational rehab assistance to qualifying candidates.   Vocational Rehab Evaluation & Intervention:  Vocational Rehab - 11/02/19 1431      Vocational Rehab Re-[place]   Comments Patient plans to return to full-time employment next week. He does not need vocational rehab.           Education: Education Goals: Education classes will be provided on a weekly basis, covering required topics. Participant will state understanding/return demonstration of topics presented.  Learning Barriers/Preferences:  Learning Barriers/Preferences  - 11/02/19 1431      Learning Barriers/Preferences   Learning Barriers None    Learning Preferences Written Material;Skilled Demonstration           Education Topics: Hypertension, Hypertension Reduction -Define heart disease and high blood pressure. Discus how high blood pressure affects the body and ways to reduce high blood pressure.   Exercise and Your Heart -Discuss why it is important to exercise, the FITT principles of exercise, normal and abnormal responses to exercise, and how to exercise safely.   CARDIAC REHAB PHASE II EXERCISE from 12/12/2019 in [place]  Date 11/07/19  Educator DJ  Instruction Review Code 1- Verbalizes Understanding      Angina -Discuss definition of angina, causes of angina, treatment of angina, and how to decrease risk of having angina.   CARDIAC REHAB PHASE II EXERCISE from 12/12/2019 in [place]  Date 11/14/19  Educator [place]  Instruction Review Code 1- Verbalizes Understanding      Cardiac Medications -Review what the following cardiac medications are used for, how they affect the body, and side effects that may occur when taking the medications.  Medications include Aspirin, Beta blockers, calcium channel blockers, ACE Inhibitors, angiotensin receptor blockers, diuretics, digoxin, and antihyperlipidemics.   Congestive Heart Failure -Discuss the definition of CHF, how to live with CHF, the signs and symptoms of CHF, and how keep track of weight and sodium intake.   Heart Disease and Intimacy -Discus the effect sexual activity has on the heart, how changes occur during intimacy as we age, and safety during sexual activity.   Smoking Cessation / COPD -Discuss different methods to quit smoking, the health benefits of quitting smoking, and the definition of COPD.   CARDIAC REHAB PHASE II EXERCISE from 12/12/2019 in [place]  Date 12/12/19  Educator DF  Instruction  Review Code 2- Demonstrated Understanding      Nutrition I: Fats -Discuss the types of cholesterol, what cholesterol does to the heart, and how cholesterol levels can be controlled.   Nutrition II: Labels -Discuss the different components of food labels and how to read food label   Heart Parts/Heart Disease and PAD -Discuss the anatomy of the heart, the pathway of blood circulation through the heart, and these are affected by heart disease.   Stress I: Signs and Symptoms -Discuss the causes of stress, how stress may lead to anxiety and depression, and ways to limit stress.   Stress II: Relaxation -Discuss different types of relaxation techniques to limit stress.   Warning Signs of Stroke / TIA -Discuss definition of a stroke, what the signs and symptoms are of a stroke, and how to identify when someone is having stroke.   Knowledge Questionnaire Score:  Knowledge Questionnaire Score - 11/02/19 1431      Knowledge Questionnaire Score   Pre Score 28/28           Core Components/Risk Factors/Patient Goals at Admission:  Personal Goals and  Risk Factors at Admission - 11/02/19 1431      Core Components/Risk Factors/Patient Goals on Admission    Weight Management Weight Maintenance    Lipids Yes    Intervention Provide education and support for participant on nutrition & aerobic/resistive exercise along with prescribed medications to achieve LDL 70mg , HDL >40mg .    Expected Outcomes Short Term: Participant states understanding of desired cholesterol values and is compliant with medications prescribed. Participant is following exercise prescription and nutrition guidelines.    Personal Goal Other Yes    Personal Goal Get his heart healthy and be health overall.    Intervention Patient will attend CR 3 days/week and supplement with exercise at home 2 days/week.    Expected Outcomes Patient will meet both program and personal goals.           Core Components/Risk  Factors/Patient Goals Review:   Goals and Risk Factor Review    Row Name 11/26/19 1415 12/24/19 1249           Core Components/Risk Factors/Patient Goals Review   Personal Goals Review Weight Management/Obesity  Get heart healthy and strong; get healthier overall. Weight Management/Obesity  Get heart healthy and strong. Get healthy overall.      Review Patient has completed 5 complete sessions maintaining his weight. He was doing well in the program. On his 6 sessions, he was in A-Fib from the onset with a HR of 140-145. He was sent for a ECG whic confirmed A-Fib with rapid ventricular response. He was advised by his cardiologist Dr. [name] not to return to CR until he was seen by cardiology 11/28/19. Will continue to monitor. Patient has completed 10 sessions losing 1 lb since last 30 day review. His attendance has not been consistent which has impeded his progress in the program. He continues to be in NSR with controlled blood pressure readings. He says he is feeling stronger and is able to work full-time without difficulty. Hopefully his attendance will improve and become more consistent. Will continue to monitor for progress.      Expected Outcomes Patient will continue to attend sessions and complete the program meeting both program and personal goals. Patient will continue to attend sessions and complete the program meeting both program and personal goals.             Core Components/Risk Factors/Patient Goals at Discharge (Final Review):   Goals and Risk Factor Review - 12/24/19 1249      Core Components/Risk Factors/Patient Goals Review   Personal Goals Review Weight Management/Obesity   Get heart healthy and strong. Get healthy overall.   Review Patient has completed 10 sessions losing 1 lb since last 30 day review. His attendance has not been consistent which has impeded his progress in the program. He continues to be in NSR with controlled blood pressure readings. He says he is  feeling stronger and is able to work full-time without difficulty. Hopefully his attendance will improve and become more consistent. Will continue to monitor for progress.    Expected Outcomes Patient will continue to attend sessions and complete the program meeting both program and personal goals.           ITP Comments:   Comments: ITP REVIEW Pt is making expected progress toward Cardiac Rehab goals after completing 10 sessions. Recommend continued exercise, life style modification, education, and increased stamina and strength.

## 2019-12-26 NOTE — Progress Notes (Signed)
Daily Session Note  Patient Details  Name: EDYN QAZI MRN: [id number] Date of Birth: [birth year] Referring Provider:     CARDIAC REHAB PHASE II ORIENTATION from 11/02/2019 in [place]  Referring Provider [name]      Encounter Date: 12/26/2019  Check In:  Session Check In - 12/26/19 0936      Check-In   Supervising physician immediately available to respond to emergencies See telemetry face sheet for immediately available MD    Location [facility]    Staff Present Hoy Register, MS, ACSM-CEP, Exercise Physiologist;Debra Wynetta Emery, RN, BSN    Virtual Visit No    Medication changes reported     No    Fall or balance concerns reported    No    Tobacco Cessation No Change    Warm-up and Cool-down Performed as group-led instruction    Resistance Training Performed Yes    VAD Patient? No    PAD/SET Patient? No      Pain Assessment   Currently in Pain? No/denies    Pain Score 0-No pain    Multiple Pain Sites No           Capillary Blood Glucose: No results found for this or any previous visit (from the past 24 hour(s)).    Social History   Tobacco Use  Smoking Status Former Smoker  . Packs/day: 0.25  . Types: Cigarettes  . Quit date: 08/02/2019  . Years since quitting: 0.4  Smokeless Tobacco Never Used  Tobacco Comment   Cutting down < 5 cig / day    Goals Met:  Independence with exercise equipment Exercise tolerated well No report of cardiac concerns or symptoms Strength training completed today  Goals Unmet:  Not Applicable  Comments: checkout time is 1030   Dr. Kathie Dike is Medical Director for [facility].

## 2019-12-28 ENCOUNTER — Other Ambulatory Visit: Payer: Self-pay

## 2019-12-28 ENCOUNTER — Encounter (HOSPITAL_COMMUNITY)
Admission: RE | Admit: 2019-12-28 | Discharge: 2019-12-28 | Disposition: A | Discharge status: Home / Self Care | Payer: BC Managed Care – PPO | Source: Ambulatory Visit | Attending: Cardiology | Admitting: Cardiology

## 2019-12-28 DIAGNOSIS — Z952 Presence of prosthetic heart valve: Secondary | ICD-10-CM

## 2019-12-28 NOTE — Progress Notes (Signed)
Daily Session Note  Patient Details  Name: Donald [name] MRN: [id number] Date of Birth: [birth year] Referring Provider:     CARDIAC REHAB PHASE II ORIENTATION from 11/02/2019 in [place]  Referring Provider [name]      Encounter Date: 12/28/2019  Check In:  Session Check In - 12/28/19 0956      Check-In   Supervising physician immediately available to respond to emergencies See telemetry face sheet for immediately available MD    Location [facility]    Staff Present Hoy Register, MS, ACSM-CEP, Exercise Physiologist;Debra Wynetta Emery, RN, BSN    Virtual Visit No    Medication changes reported     No    Fall or balance concerns reported    No    Tobacco Cessation No Change    Warm-up and Cool-down Performed as group-led instruction    Resistance Training Performed Yes    VAD Patient? No    PAD/SET Patient? No      Pain Assessment   Currently in Pain? No/denies    Pain Score 0-No pain    Multiple Pain Sites No           Capillary Blood Glucose: No results found for this or any previous visit (from the past 24 hour(s)).    Social History   Tobacco Use  Smoking Status Former Smoker  . Packs/day: 0.25  . Types: Cigarettes  . Quit date: 08/02/2019  . Years since quitting: 0.4  Smokeless Tobacco Never Used  Tobacco Comment   Cutting down < 5 cig / day    Goals Met:  Independence with exercise equipment Exercise tolerated well No report of cardiac concerns or symptoms Strength training completed today  Goals Unmet:  Not Applicable  Comments: checkout time is 1030   Dr. Kathie Dike is Medical Director for [facility].

## 2019-12-31 ENCOUNTER — Encounter (HOSPITAL_COMMUNITY): Payer: BC Managed Care – PPO

## 2020-01-02 ENCOUNTER — Encounter (HOSPITAL_COMMUNITY)
Admission: RE | Admit: 2020-01-02 | Discharge: 2020-01-02 | Disposition: A | Discharge status: Home / Self Care | Payer: BC Managed Care – PPO | Source: Ambulatory Visit | Attending: Cardiology | Admitting: Cardiology

## 2020-01-02 ENCOUNTER — Other Ambulatory Visit: Payer: Self-pay

## 2020-01-02 ENCOUNTER — Telehealth: Payer: Self-pay | Admitting: Cardiology

## 2020-01-02 VITALS — Wt 199.3 lb

## 2020-01-02 DIAGNOSIS — Z952 Presence of prosthetic heart valve: Secondary | ICD-10-CM | POA: Diagnosis not present

## 2020-01-02 MED ORDER — AMOXICILLIN 500 MG PO TABS: 2000.0000 mg | ORAL_TABLET | ORAL | 3 refills | Status: DC

## 2020-01-02 NOTE — Telephone Encounter (Signed)
Called to notify pt. No answer. Left msg to call back.  

## 2020-01-02 NOTE — Telephone Encounter (Signed)
Yes.  He should take amoxicillin 2000 mg 30 minutes to 1 hour prior to dental procedure.  Please provide a prescription.

## 2020-01-02 NOTE — Telephone Encounter (Signed)
     Does patient needs premed for oral surgery? He is having routine cleaning 01/03/20 - had aortic valve replacement back in 07/2019    [facility] [address]

## 2020-01-02 NOTE — Progress Notes (Signed)
Daily Session Note  Patient Details  Name: Donald [name] MRN: [id number] Date of Birth: 1966-03-19 Referring Provider:     CARDIAC REHAB PHASE II ORIENTATION from 11/02/2019 in [place]  Referring Provider [name]      Encounter Date: 01/02/2020  Check In:  Session Check In - 01/02/20 0936      Check-In   Supervising physician immediately available to respond to emergencies See telemetry face sheet for immediately available MD    Location [facility]    Staff Present Hoy Register, MS, ACSM-CEP, Exercise Physiologist;Carlette Wilber Oliphant, RN, BSN    Virtual Visit No    Medication changes reported     No    Fall or balance concerns reported    No    Tobacco Cessation No Change    Warm-up and Cool-down Performed as group-led instruction    Resistance Training Performed Yes    VAD Patient? No    PAD/SET Patient? No      Pain Assessment   Currently in Pain? No/denies           Capillary Blood Glucose: No results found for this or any previous visit (from the past 24 hour(s)).   Exercise Prescription Changes - 01/02/20 1300      Response to Exercise   Blood Pressure (Admit) 106/74    Blood Pressure (Exercise) 120/80    Blood Pressure (Exit) 106/76    Heart Rate (Admit) 90 bpm    Heart Rate (Exercise) 103 bpm    Heart Rate (Exit) 87 bpm    Rating of Perceived Exertion (Exercise) 13    Duration Continue with 30 min of aerobic exercise without signs/symptoms of physical distress.    Intensity THRR unchanged      Progression   Progression Continue to progress workloads to maintain intensity without signs/symptoms of physical distress.      Resistance Training   Training Prescription Yes    Weight 3 lbs    Reps 10-15      Treadmill   MPH 2.7    Grade 4    Minutes 22    METs 4.54      Recumbant Elliptical   Level 2    RPM 65    Watts 91    Minutes 17    METs 4.9           Social History   Tobacco Use  Smoking  Status Former Smoker  . Packs/day: 0.25  . Types: Cigarettes  . Quit date: 08/02/2019  . Years since quitting: 0.4  Smokeless Tobacco Never Used  Tobacco Comment   Cutting down < 5 cig / day    Goals Met:  Independence with exercise equipment Exercise tolerated well No report of cardiac concerns or symptoms Strength training completed today  Goals Unmet:  Not Applicable  Comments: checkout time is 1030   Dr. Kathie Dike is Medical Director for [facility] Pulmonary Re[facility].

## 2020-01-02 NOTE — Telephone Encounter (Signed)
Pt notified and voiced understanding 

## 2020-01-04 ENCOUNTER — Encounter (HOSPITAL_COMMUNITY)
Admission: RE | Admit: 2020-01-04 | Discharge: 2020-01-04 | Disposition: A | Discharge status: Home / Self Care | Payer: BC Managed Care – PPO | Source: Ambulatory Visit | Attending: Cardiology | Admitting: Cardiology

## 2020-01-04 ENCOUNTER — Other Ambulatory Visit: Payer: Self-pay

## 2020-01-04 DIAGNOSIS — Z952 Presence of prosthetic heart valve: Secondary | ICD-10-CM | POA: Diagnosis not present

## 2020-01-04 NOTE — Progress Notes (Signed)
Daily Session Note  Patient Details  Name: KAIN MILOSEVIC MRN: [id number] Date of Birth: [birth year] Referring Provider:     CARDIAC REHAB PHASE II ORIENTATION from 11/02/2019 in [place]  Referring Provider [name]      Encounter Date: 01/04/2020  Check In:  Session Check In - 01/04/20 0934      Check-In   Supervising physician immediately available to respond to emergencies See telemetry face sheet for immediately available MD    Location [facility]    Staff Present Hoy Register, MS, ACSM-CEP, Exercise Physiologist;Carlette Wilber Oliphant, RN, BSN    Virtual Visit No    Medication changes reported     No    Fall or balance concerns reported    No    Tobacco Cessation No Change    Warm-up and Cool-down Performed as group-led instruction    Resistance Training Performed Yes    VAD Patient? No    PAD/SET Patient? No      Pain Assessment   Currently in Pain? No/denies    Pain Score 0-No pain           Capillary Blood Glucose: No results found for this or any previous visit (from the past 24 hour(s)).    Social History   Tobacco Use  Smoking Status Former Smoker  . Packs/day: 0.25  . Types: Cigarettes  . Quit date: 08/02/2019  . Years since quitting: 0.4  Smokeless Tobacco Never Used  Tobacco Comment   Cutting down < 5 cig / day    Goals Met:  Independence with exercise equipment Exercise tolerated well No report of cardiac concerns or symptoms Strength training completed today  Goals Unmet:  Not Applicable  Comments: checkout time is 1030    Dr. Kathie Dike is Medical Director for [facility] Pul[facility].

## 2020-01-07 ENCOUNTER — Encounter (HOSPITAL_COMMUNITY)
Admission: RE | Admit: 2020-01-07 | Discharge: 2020-01-07 | Disposition: A | Discharge status: Home / Self Care | Payer: BC Managed Care – PPO | Source: Ambulatory Visit | Attending: Cardiology | Admitting: Cardiology

## 2020-01-07 ENCOUNTER — Other Ambulatory Visit: Payer: Self-pay

## 2020-01-07 DIAGNOSIS — Z952 Presence of prosthetic heart valve: Secondary | ICD-10-CM

## 2020-01-07 NOTE — Progress Notes (Signed)
Daily Session Note  Patient Details  Name: Donald [name] MRN: [id number] Date of Birth: 05-Apr-1966 Referring Provider:     CARDIAC REHAB PHASE II ORIENTATION from 11/02/2019 in [place]  Referring Provider [name]      Encounter Date: 01/07/2020  Check In:  Session Check In - 01/07/20 0939      Check-In   Supervising physician immediately available to respond to emergencies See telemetry face sheet for immediately available MD    Location [facility]    Staff Present Aundra Dubin, RN, Bjorn Loser, MS, ACSM-CEP, Exercise Physiologist    Virtual Visit No    Medication changes reported     No    Fall or balance concerns reported    No    Tobacco Cessation No Change    Current number of cigarettes/nicotine per day     5    Warm-up and Cool-down Performed as group-led instruction    Resistance Training Performed Yes    VAD Patient? No    PAD/SET Patient? No      Pain Assessment   Currently in Pain? No/denies    Pain Score 0-No pain    Multiple Pain Sites No           Capillary Blood Glucose: No results found for this or any previous visit (from the past 24 hour(s)).    Social History   Tobacco Use  Smoking Status Former Smoker  . Packs/day: 0.25  . Types: Cigarettes  . Quit date: 08/02/2019  . Years since quitting: 0.4  Smokeless Tobacco Never Used  Tobacco Comment   Cutting down < 5 cig / day    Goals Met:  Independence with exercise equipment Exercise tolerated well No report of cardiac concerns or symptoms Strength training completed today  Goals Unmet:  Not Applicable  Comments: checkout time is 1030   Dr. Kathie Dike is Medical Director for [facility] Pulmonary Rehab.

## 2020-01-09 ENCOUNTER — Encounter (HOSPITAL_COMMUNITY)
Admission: RE | Admit: 2020-01-09 | Discharge: 2020-01-09 | Disposition: A | Discharge status: Home / Self Care | Payer: BC Managed Care – PPO | Source: Ambulatory Visit | Attending: Cardiology | Admitting: Cardiology

## 2020-01-09 ENCOUNTER — Other Ambulatory Visit: Payer: Self-pay

## 2020-01-09 DIAGNOSIS — Z952 Presence of prosthetic heart valve: Secondary | ICD-10-CM | POA: Diagnosis not present

## 2020-01-09 NOTE — Progress Notes (Signed)
Daily Session Note  Patient Details  Name: Donald [name] MRN: [id number] Date of Birth: 05-May-1965 Referring Provider:     CARDIAC REHAB PHASE II ORIENTATION from 11/02/2019 in [place]  Referring Provider [name]      Encounter Date: 01/09/2020  Check In:  Session Check In - 01/09/20 0939      Check-In   Supervising physician immediately available to respond to emergencies See telemetry face sheet for immediately available MD    Location [facility]    Staff Present Aundra Dubin, RN, Bjorn Loser, MS, ACSM-CEP, Exercise Physiologist    Virtual Visit No    Medication changes reported     No    Fall or balance concerns reported    No    Tobacco Cessation No Change    Warm-up and Cool-down Performed as group-led instruction    Resistance Training Performed Yes    VAD Patient? No    PAD/SET Patient? No      Pain Assessment   Currently in Pain? No/denies    Pain Score 0-No pain    Multiple Pain Sites No           Capillary Blood Glucose: No results found for this or any previous visit (from the past 24 hour(s)).    Social History   Tobacco Use  Smoking Status Former Smoker  . Packs/day: 0.25  . Types: Cigarettes  . Quit date: 08/02/2019  . Years since quitting: 0.4  Smokeless Tobacco Never Used  Tobacco Comment   Cutting down < 5 cig / day    Goals Met:  Independence with exercise equipment Exercise tolerated well No report of cardiac concerns or symptoms Strength training completed today  Goals Unmet:  Not Applicable  Comments: checkout time is 1030   Dr. Kathie Dike is Medical Director for [facility] Pul[facility] Re[facility].

## 2020-01-11 ENCOUNTER — Encounter (HOSPITAL_COMMUNITY)
Admission: RE | Admit: 2020-01-11 | Discharge: 2020-01-11 | Disposition: A | Discharge status: Home / Self Care | Payer: BC Managed Care – PPO | Source: Ambulatory Visit | Attending: Cardiology | Admitting: Cardiology

## 2020-01-11 ENCOUNTER — Other Ambulatory Visit: Payer: Self-pay

## 2020-01-11 DIAGNOSIS — Z952 Presence of prosthetic heart valve: Secondary | ICD-10-CM

## 2020-01-11 NOTE — Progress Notes (Signed)
Daily Session Note  Patient Details  Name: Donald [name] MRN: [id number] Date of Birth: 29-Jan-1966 Referring Provider:     CARDIAC REHAB PHASE II ORIENTATION from 11/02/2019 in [place]  Referring Provider [name]      Encounter Date: 01/11/2020  Check In:  Session Check In - 01/11/20 0932      Check-In   Supervising physician immediately available to respond to emergencies See telemetry face sheet for immediately available MD    Location [facility]    Staff Present Ramon Dredge, RN, Madelaine Bhat, RN, BSN    Virtual Visit No    Medication changes reported     No    Fall or balance concerns reported    No    Tobacco Cessation No Change    Warm-up and Cool-down Performed as group-led instruction    Resistance Training Performed Yes    VAD Patient? No    PAD/SET Patient? No      Pain Assessment   Currently in Pain? No/denies    Pain Score 0-No pain    Multiple Pain Sites No           Capillary Blood Glucose: No results found for this or any previous visit (from the past 24 hour(s)).    Social History   Tobacco Use  Smoking Status Former Smoker  . Packs/day: 0.25  . Types: Cigarettes  . Quit date: 08/02/2019  . Years since quitting: 0.4  Smokeless Tobacco Never Used  Tobacco Comment   Cutting down < 5 cig / day    Goals Met:  Independence with exercise equipment Exercise tolerated well No report of cardiac concerns or symptoms Strength training completed today  Goals Unmet:  Not Applicable  Comments: [id number]   Dr. Kathie Dike is Medical Director for [facility] Pul[facility] Re[facility].

## 2020-01-14 ENCOUNTER — Encounter (HOSPITAL_COMMUNITY): Payer: BC Managed Care – PPO

## 2020-01-16 ENCOUNTER — Other Ambulatory Visit: Payer: Self-pay | Admitting: Student

## 2020-01-16 ENCOUNTER — Other Ambulatory Visit: Payer: Self-pay

## 2020-01-16 ENCOUNTER — Encounter (HOSPITAL_COMMUNITY)
Admission: RE | Admit: 2020-01-16 | Discharge: 2020-01-16 | Disposition: A | Discharge status: Home / Self Care | Payer: BC Managed Care – PPO | Source: Ambulatory Visit | Attending: Cardiology | Admitting: Cardiology

## 2020-01-16 VITALS — Wt 200.4 lb

## 2020-01-16 DIAGNOSIS — Z952 Presence of prosthetic heart valve: Secondary | ICD-10-CM | POA: Diagnosis not present

## 2020-01-16 MED ORDER — APIXABAN 5 MG PO TABS
5.0000 mg | ORAL_TABLET | Freq: Two times a day (BID) | ORAL | 6 refills | Status: DC
Start: 2020-01-16 — End: 2020-05-21

## 2020-01-16 NOTE — Telephone Encounter (Signed)
Please call in [facility] to [facility]      Thanks renee

## 2020-01-16 NOTE — Progress Notes (Signed)
Daily Session Note  Patient Details  Name: Donald [name] MRN: [id number] Date of Birth: 10/23/1965 Referring Provider:     CARDIAC REHAB PHASE II ORIENTATION from 11/02/2019 in [place]  Referring Provider [name]      Encounter Date: 01/16/2020  Check In:  Session Check In - 01/16/20 0957      Check-In   Supervising physician immediately available to respond to emergencies See telemetry face sheet for immediately available MD    Location [facility]    Staff Present Hoy Register, MS, ACSM-CEP, Exercise Physiologist;Debra Wynetta Emery, RN, BSN    Virtual Visit No    Medication changes reported     No    Fall or balance concerns reported    No    Tobacco Cessation No Change    Warm-up and Cool-down Performed as group-led instruction    Resistance Training Performed Yes    VAD Patient? No    PAD/SET Patient? No      Pain Assessment   Currently in Pain? No/denies    Pain Score 0-No pain    Multiple Pain Sites No           Capillary Blood Glucose: No results found for this or any previous visit (from the past 24 hour(s)).   Exercise Prescription Changes - 01/16/20 0900      Home Exercise Plan   Plans to continue exercise at Home (comment)    Frequency Add 2 additional days to program exercise sessions.    Initial Home Exercises Provided 01/16/20           Social History   Tobacco Use  Smoking Status Former Smoker  . Packs/day: 0.25  . Types: Cigarettes  . Quit date: 08/02/2019  . Years since quitting: 0.4  Smokeless Tobacco Never Used  Tobacco Comment   Cutting down < 5 cig / day    Goals Met:  Independence with exercise equipment Exercise tolerated well No report of cardiac concerns or symptoms Strength training completed today  Goals Unmet:  Not Applicable  Comments: checkout time is 1030   Dr. Kathie Dike is Medical Director for [facility].

## 2020-01-16 NOTE — Telephone Encounter (Signed)
Refill complete 

## 2020-01-16 NOTE — Progress Notes (Signed)
I have reviewed a Home Exercise Prescription with Donald [name] . Donald [name] is currently exercising at home.  The patient was advised to walk 2-4 days a week for 30-45 minutes.  Donald [name] and I discussed how to progress their exercise prescription.  The patient stated that their goals were to get stronger and to increase his speed on the treadmill.  The patient stated that they understand the exercise prescription.  We reviewed exercise guidelines, target heart rate during exercise, RPE Scale, weather conditions, NTG use, endpoints for exercise, warmup and cool down.  Patient is encouraged to come to me with any questions. I will continue to follow up with the patient to assist them with progression and safety.

## 2020-01-18 ENCOUNTER — Encounter (HOSPITAL_COMMUNITY)
Admission: RE | Admit: 2020-01-18 | Discharge: 2020-01-18 | Disposition: A | Discharge status: Home / Self Care | Payer: BC Managed Care – PPO | Source: Ambulatory Visit | Attending: Cardiology | Admitting: Cardiology

## 2020-01-18 ENCOUNTER — Other Ambulatory Visit: Payer: Self-pay

## 2020-01-18 DIAGNOSIS — Z952 Presence of prosthetic heart valve: Secondary | ICD-10-CM

## 2020-01-18 NOTE — Progress Notes (Signed)
Daily Session Note  Patient Details  Name: Donald [name] MRN: [id number] Date of Birth: 22-Apr-1966 Referring Provider:     CARDIAC REHAB PHASE II ORIENTATION from 11/02/2019 in [place]  Referring Provider [name]      Encounter Date: 01/18/2020  Check In:  Session Check In - 01/18/20 0931      Check-In   Supervising physician immediately available to respond to emergencies See telemetry face sheet for immediately available MD    Location [facility]    Staff Present Ramon Dredge, RN, Madelaine Bhat, RN, BSN    Medication changes reported     No    Fall or balance concerns reported    No    Tobacco Cessation No Change    Warm-up and Cool-down Performed as group-led instruction    Resistance Training Performed Yes    VAD Patient? No    PAD/SET Patient? No      Pain Assessment   Currently in Pain? No/denies           Capillary Blood Glucose: No results found for this or any previous visit (from the past 24 hour(s)).    Social History   Tobacco Use  Smoking Status Former Smoker  . Packs/day: 0.25  . Types: Cigarettes  . Quit date: 08/02/2019  . Years since quitting: 0.4  Smokeless Tobacco Never Used  Tobacco Comment   Cutting down < 5 cig / day    Goals Met:  Independence with exercise equipment Exercise tolerated well No report of cardiac concerns or symptoms Strength training completed today  Goals Unmet:  Not Applicable  Comments: [id number]   Dr. Kathie Dike is Medical Director for [facility] Pulmonary Rehab.

## 2020-01-21 ENCOUNTER — Other Ambulatory Visit: Payer: Self-pay

## 2020-01-21 ENCOUNTER — Encounter (HOSPITAL_COMMUNITY)
Admission: RE | Admit: 2020-01-21 | Discharge: 2020-01-21 | Disposition: A | Discharge status: Home / Self Care | Payer: BC Managed Care – PPO | Source: Ambulatory Visit | Attending: Cardiology | Admitting: Cardiology

## 2020-01-21 DIAGNOSIS — Z952 Presence of prosthetic heart valve: Secondary | ICD-10-CM

## 2020-01-21 NOTE — Progress Notes (Signed)
Daily Session Note  Patient Details  Name: Donald [name] MRN: [id number] Date of Birth: Feb 03, 1966 Referring Provider:     CARDIAC REHAB PHASE II ORIENTATION from 11/02/2019 in [place]  Referring Provider [name]      Encounter Date: 01/21/2020  Check In:  Session Check In - 01/21/20 0945      Check-In   Supervising physician immediately available to respond to emergencies See telemetry face sheet for immediately available MD    Location [facility]    Staff Present Hoy Register, MS, ACSM-CEP, Exercise Physiologist;Debra Wynetta Emery, RN, BSN    Virtual Visit No    Medication changes reported     No    Fall or balance concerns reported    No    Tobacco Cessation No Change    Warm-up and Cool-down Performed as group-led instruction    Resistance Training Performed Yes    VAD Patient? No    PAD/SET Patient? No      Pain Assessment   Currently in Pain? No/denies    Pain Score 0-No pain    Multiple Pain Sites No           Capillary Blood Glucose: No results found for this or any previous visit (from the past 24 hour(s)).    Social History   Tobacco Use  Smoking Status Former Smoker  . Packs/day: 0.25  . Types: Cigarettes  . Quit date: 08/02/2019  . Years since quitting: 0.4  Smokeless Tobacco Never Used  Tobacco Comment   Cutting down < 5 cig / day    Goals Met:  Independence with exercise equipment Exercise tolerated well No report of cardiac concerns or symptoms Strength training completed today  Goals Unmet:  Not Applicable  Comments: checkout time is 1030   Dr. Kathie Dike is Medical Director for [facility] Pulmonary Rehab.

## 2020-01-23 ENCOUNTER — Encounter (HOSPITAL_COMMUNITY)
Admission: RE | Admit: 2020-01-23 | Discharge: 2020-01-23 | Disposition: A | Discharge status: Home / Self Care | Payer: BC Managed Care – PPO | Source: Ambulatory Visit | Attending: Cardiology | Admitting: Cardiology

## 2020-01-23 ENCOUNTER — Other Ambulatory Visit: Payer: Self-pay

## 2020-01-23 DIAGNOSIS — Z952 Presence of prosthetic heart valve: Secondary | ICD-10-CM | POA: Diagnosis not present

## 2020-01-23 NOTE — Progress Notes (Signed)
Daily Session Note  Patient Details  Name: Donald [name] MRN: [id number] Date of Birth: 27-Sep-1965 Referring Provider:     CARDIAC REHAB PHASE II ORIENTATION from 11/02/2019 in [name]  Referring Provider Donald [name]      Encounter Date: 01/23/2020  Check In:  Session Check In - 01/23/20 0930      Check-In   Supervising physician immediately available to respond to emergencies See telemetry face sheet for immediately available MD    Location [facility]    Staff Present Geanie Cooley, Kermit Balo, RN, Bjorn Loser, MS, ACSM-CEP, Exercise Physiologist    Virtual Visit No    Medication changes reported     No    Fall or balance concerns reported    No    Tobacco Cessation No Change    Warm-up and Cool-down Performed as group-led instruction    Resistance Training Performed Yes    VAD Patient? No    PAD/SET Patient? No      Pain Assessment   Currently in Pain? No/denies    Pain Score 0-No pain    Multiple Pain Sites No           Capillary Blood Glucose: No results found for this or any previous visit (from the past 24 hour(s)).    Social History   Tobacco Use  Smoking Status Former Smoker  . Packs/day: 0.25  . Types: Cigarettes  . Quit date: 08/02/2019  . Years since quitting: 0.4  Smokeless Tobacco Never Used  Tobacco Comment   Cutting down < 5 cig / day    Goals Met:  Independence with exercise equipment Exercise tolerated well No report of cardiac concerns or symptoms Strength training completed today  Goals Unmet:  Not Applicable  Comments: check out @ 10:30   Dr. Kathie Dike is Medical Director for [facility].

## 2020-01-23 NOTE — Progress Notes (Signed)
Cardiac Individual Treatment Plan  Patient Details  Name: Donald [name] MRN: [id number] Date of Birth: [birth year] Referring Provider:     CARDIAC REHAB PHASE II ORIENTATION from 11/02/2019 in [facility]  Referring Provider [name]      Initial Encounter Date:    CARDIAC REHAB PHASE II ORIENTATION from 11/02/2019 in [facility]  Date 11/02/19      Visit Diagnosis: S/P aortic valve replacement  Patient's Home Medications on Admission:  Current Outpatient Medications:  .  allopurinol (ZYLOPRIM) 100 MG tablet, Take 1 tablet (100 mg total) by mouth daily., Disp: 30 tablet, Rfl: 6 .  amoxicillin (AMOXIL) 500 MG tablet, Take 4 tablets (2,000 mg total) by mouth as directed. Take 30 minutes to 1 hour prior to dental procedure, Disp: 4 tablet, Rfl: 3 .  apixaban (ELIQUIS) 5 MG TABS tablet, Take 1 tablet (5 mg total) by mouth 2 (two) times daily., Disp: 60 tablet, Rfl: 6 .  atorvastatin (LIPITOR) 10 MG tablet, Take 10 mg by mouth every evening. , Disp: , Rfl:  .  Cholecalciferol (VITAMIN D3) 50 MCG (2000 UT) TABS, Take 2,000 Units by mouth daily., Disp: , Rfl:  .  Coenzyme Q10 (COQ10) 100 MG CAPS, Take 100 mg by mouth daily., Disp: , Rfl:  .  diclofenac Sodium (VOLTAREN) 1 % GEL, Apply 2 g topically 2 (two) times daily as needed. Rub into affected area of foot 2 times daily as needed, Disp: 100 g, Rfl: 2 .  metoprolol succinate (TOPROL XL) 50 MG 24 hr tablet, Take 1 tablet (50 mg total) by mouth daily. Take with or immediately following a meal., Disp: 90 tablet, Rfl: 1 .  Multiple Vitamin (MULTIVITAMIN WITH MINERALS) TABS tablet, Take 1 tablet by mouth daily., Disp: , Rfl:  .  Omega-3 Fatty Acids (FISH OIL PO), Take 2,000 mg by mouth daily. , Disp: , Rfl:  .  Probiotic Product (ACIDOPHILUS SUPER PROBIOTIC PO), Take 1 tablet by mouth daily., Disp: , Rfl:   Past Medical History: Past Medical History:  Diagnosis Date  . Bone tumor 11/22/2013  .  Cardiomyopathy ([place])    a. EF 30 to 35% by echo in 06/2019 in the setting of severe AI  . Encounter for screening for malignant neoplasm of colon 06/27/2019  . Glenoid labral tear 10/27/2010  . Hyperlipidemia   . Rotator cuff tear, right 10/27/2010  . S/P aortic valve replacement with bioprosthetic valve 08/09/2019   29 mm Edwards Inspiris Resilia stented bovine pericardial tissue valve  . S/P patent foramen ovale closure 08/09/2019  . Shoulder injury 10/27/2010    Tobacco Use: Social History   Tobacco Use  Smoking Status Former Smoker  . Packs/day: 0.25  . Types: Cigarettes  . Quit date: 08/02/2019  . Years since quitting: 0.4  Smokeless Tobacco Never Used  Tobacco Comment   Cutting down < 5 cig / day    Labs: Recent Review Flowsheet Data    Labs for ITP Cardiac and Pulmonary Rehab Latest Ref Rng & Units 08/09/2019 08/09/2019 08/09/2019 08/09/2019 08/09/2019   Cholestrol <200 mg/dL - - - - -   LDLCALC mg/dL (calc) - - - - -   HDL > OR = 40 mg/dL - - - - -   Trlycerides <150 mg/dL - - - - -   Hemoglobin A1c 4.8 - 5.6 % - - - - -   PHART 7.35 - 7.45 - 7.302(L) 7.276(L) 7.336(L) 7.288(L)   PCO2ART 32 - 48 mmHg -  49.0(H) 49.7(H) 42.4 46.5   HCO3 20.0 - 28.0 mmol/L - 24.3 23.2 22.7 22.4   TCO2 22 - 32 mmol/L 26 26 25 24 24    ACIDBASEDEF 0.0 - 2.0 mmol/L - 2.0 4.0(H) 3.0(H) 5.0(H)   O2SAT % - 100.0 96.0 95.0 98.0      Capillary Blood Glucose: Lab Results  Component Value Date   GLUCAP 97 08/12/2019   GLUCAP 83 08/12/2019   GLUCAP 114 (H) 08/11/2019   GLUCAP 112 (H) 08/11/2019   GLUCAP 95 08/11/2019     Exercise Target Goals: Exercise Program Goal: Individual exercise prescription set using results from initial 6 min walk test and THRR while considering  patient's activity barriers and safety.   Exercise Prescription Goal: Starting with aerobic activity 30 plus minutes a day, 3 days per week for initial exercise prescription. Provide home exercise prescription and guidelines that  participant acknowledges understanding prior to discharge.  Activity Barriers & Risk Stratification:  Activity Barriers & Cardiac Risk Stratification - 11/02/19 1446      Activity Barriers & Cardiac Risk Stratification   Activity Barriers Deconditioning    Comments Bilateral shoulder surgery and left hip has been bothering him.    Cardiac Risk Stratification High           6 Minute Walk:  6 Minute Walk    Row Name 11/02/19 1434         6 Minute Walk   Phase Initial     Distance 1300 feet     Walk Time 6 minutes     # of Rest Breaks 0     MPH 2.46     METS 4.05     RPE 9     VO2 Peak 14.18     Symptoms No     Resting HR 81 bpm     Resting BP 110/80     Resting Oxygen Saturation  96 %     Exercise Oxygen Saturation  during 6 min walk 96 %     Max Ex. HR 104 bpm     Max Ex. BP 118/82     2 Minute Post BP 130/80            Oxygen Initial Assessment:   Oxygen Re-Evaluation:   Oxygen Discharge (Final Oxygen Re-Evaluation):   Initial Exercise Prescription:  Initial Exercise Prescription - 11/02/19 1400      Date of Initial Exercise RX and Referring Provider   Date 11/02/19    Referring Provider [name]    Expected Discharge Date 02/02/20      Treadmill   MPH 1.5    Grade 0    Minutes 17    METs 2.14      Recumbant Elliptical   Level 1    RPM 60    Minutes 22    METs --      Prescription Details   Frequency (times per week) 3    Duration Progress to 30 minutes of continuous aerobic without signs/symptoms of physical distress      Intensity   THRR 40-80% of Max Heartrate 67-134    Ratings of Perceived Exertion 11-13    Perceived Dyspnea 0-4      Progression   Progression Continue progressive overload as per policy without signs/symptoms or physical distress.      Resistance Training   Training Prescription Yes    Weight 2    Reps 10-15           Perform Capillary  Blood Glucose checks as needed.  Exercise Prescription Changes:    Exercise Prescription Changes    Row Name 11/14/19 1329 12/17/19 1200 01/02/20 1300 01/16/20 0900 01/16/20 1200     Response to Exercise   Blood Pressure (Admit) 124/82 122/70 106/74 -- 140/90   Blood Pressure (Exercise) 140/80 134/74 120/80 -- 140/84   Blood Pressure (Exit) 126/80 112/78 106/76 -- 112/80   Heart Rate (Admit) 85 bpm 81 bpm 90 bpm -- 88 bpm   Heart Rate (Exercise) 106 bpm 101 bpm 103 bpm -- 110 bpm   Heart Rate (Exit) 94 bpm 86 bpm 87 bpm -- 97 bpm   Rating of Perceived Exertion (Exercise) 13 13 13  -- 14   Duration Continue with 30 min of aerobic exercise without signs/symptoms of physical distress. Continue with 30 min of aerobic exercise without signs/symptoms of physical distress. Continue with 30 min of aerobic exercise without signs/symptoms of physical distress. -- Continue with 30 min of aerobic exercise without signs/symptoms of physical distress.   Intensity THRR unchanged THRR unchanged THRR unchanged -- THRR unchanged     Progression   Progression Continue to progress workloads to maintain intensity without signs/symptoms of physical distress. Continue to progress workloads to maintain intensity without signs/symptoms of physical distress. Continue to progress workloads to maintain intensity without signs/symptoms of physical distress. -- Continue to progress workloads to maintain intensity without signs/symptoms of physical distress.     Resistance Training   Training Prescription Yes Yes Yes -- Yes   Weight 3 3 3  lbs -- 4 lbs   Reps 10-15 10-15 10-15 -- 10-15     Treadmill   MPH 2.5 2.6 2.7 -- 2.8   Grade 0 3 4 -- 5   Minutes 22 22 22  -- 22   METs 2.91 4.06 4.54 -- 5.05     Recumbant Elliptical   Level 1 2 2  -- 3   RPM 53 62 65 -- 67   Watts -- 89 91 -- 97   Minutes 17 17 17  -- 17   METs 4.3 4.8 4.9 -- 5.5     Home Exercise Plan   Plans to continue exercise at -- -- -- Home (comment) --   Frequency -- -- -- Add 2 additional days to program exercise  sessions. --   Initial Home Exercises Provided -- -- -- 01/16/20 --          Exercise Comments:   Exercise Comments    Row Name 11/02/19 1504 01/16/20 0958         Exercise Comments Completed walk test with no signs or symptoms. Home exercise reviewed.             Exercise Goals and Review:   Exercise Goals    Row Name 11/02/19 1450 11/26/19 1428 12/24/19 1036 01/21/20 1245       Exercise Goals   Increase Physical Activity Yes Yes Yes Yes    Intervention Provide advice, education, support and counseling about physical activity/exercise needs.;Develop an individualized exercise prescription for aerobic and resistive training based on initial evaluation findings, risk stratification, comorbidities and participant's personal goals. Provide advice, education, support and counseling about physical activity/exercise needs.;Develop an individualized exercise prescription for aerobic and resistive training based on initial evaluation findings, risk stratification, comorbidities and participant's personal goals. Provide advice, education, support and counseling about physical activity/exercise needs.;Develop an individualized exercise prescription for aerobic and resistive training based on initial evaluation findings, risk stratification, comorbidities and participant's personal goals. Provide advice, education,  support and counseling about physical activity/exercise needs.;Develop an individualized exercise prescription for aerobic and resistive training based on initial evaluation findings, risk stratification, comorbidities and participant's personal goals.    Expected Outcomes Short Term: Attend rehab on a regular basis to increase amount of physical activity.;Long Term: Add in home exercise to make exercise part of routine and to increase amount of physical activity.;Long Term: Exercising regularly at least 3-5 days a week. Short Term: Attend rehab on a regular basis to increase amount of  physical activity.;Long Term: Add in home exercise to make exercise part of routine and to increase amount of physical activity.;Long Term: Exercising regularly at least 3-5 days a week. Short Term: Attend rehab on a regular basis to increase amount of physical activity.;Long Term: Add in home exercise to make exercise part of routine and to increase amount of physical activity.;Long Term: Exercising regularly at least 3-5 days a week. Short Term: Attend rehab on a regular basis to increase amount of physical activity.;Long Term: Add in home exercise to make exercise part of routine and to increase amount of physical activity.;Long Term: Exercising regularly at least 3-5 days a week.    Increase Strength and Stamina Yes Yes Yes Yes    Intervention Provide advice, education, support and counseling about physical activity/exercise needs.;Develop an individualized exercise prescription for aerobic and resistive training based on initial evaluation findings, risk stratification, comorbidities and participant's personal goals. Provide advice, education, support and counseling about physical activity/exercise needs.;Develop an individualized exercise prescription for aerobic and resistive training based on initial evaluation findings, risk stratification, comorbidities and participant's personal goals. Provide advice, education, support and counseling about physical activity/exercise needs.;Develop an individualized exercise prescription for aerobic and resistive training based on initial evaluation findings, risk stratification, comorbidities and participant's personal goals. Provide advice, education, support and counseling about physical activity/exercise needs.;Develop an individualized exercise prescription for aerobic and resistive training based on initial evaluation findings, risk stratification, comorbidities and participant's personal goals.    Expected Outcomes Short Term: Increase workloads from initial  exercise prescription for resistance, speed, and METs.;Short Term: Perform resistance training exercises routinely during rehab and add in resistance training at home;Long Term: Improve cardiorespiratory fitness, muscular endurance and strength as measured by increased METs and functional capacity (6MWT) Short Term: Increase workloads from initial exercise prescription for resistance, speed, and METs.;Short Term: Perform resistance training exercises routinely during rehab and add in resistance training at home;Long Term: Improve cardiorespiratory fitness, muscular endurance and strength as measured by increased METs and functional capacity (6MWT) Short Term: Increase workloads from initial exercise prescription for resistance, speed, and METs.;Short Term: Perform resistance training exercises routinely during rehab and add in resistance training at home;Long Term: Improve cardiorespiratory fitness, muscular endurance and strength as measured by increased METs and functional capacity (6MWT) Short Term: Increase workloads from initial exercise prescription for resistance, speed, and METs.;Short Term: Perform resistance training exercises routinely during rehab and add in resistance training at home;Long Term: Improve cardiorespiratory fitness, muscular endurance and strength as measured by increased METs and functional capacity (6MWT)    Able to understand and use rate of perceived exertion (RPE) scale Yes Yes Yes Yes    Intervention Provide education and explanation on how to use RPE scale Provide education and explanation on how to use RPE scale Provide education and explanation on how to use RPE scale Provide education and explanation on how to use RPE scale    Expected Outcomes Short Term: Able to use RPE daily in rehab to  express subjective intensity level;Long Term:  Able to use RPE to guide intensity level when exercising independently Short Term: Able to use RPE daily in rehab to express subjective  intensity level;Long Term:  Able to use RPE to guide intensity level when exercising independently Short Term: Able to use RPE daily in rehab to express subjective intensity level;Long Term:  Able to use RPE to guide intensity level when exercising independently Short Term: Able to use RPE daily in rehab to express subjective intensity level;Long Term:  Able to use RPE to guide intensity level when exercising independently    Knowledge and understanding of Target Heart Rate Range (THRR) Yes Yes Yes Yes    Intervention Provide education and explanation of THRR including how the numbers were predicted and where they are located for reference Provide education and explanation of THRR including how the numbers were predicted and where they are located for reference Provide education and explanation of THRR including how the numbers were predicted and where they are located for reference Provide education and explanation of THRR including how the numbers were predicted and where they are located for reference    Expected Outcomes Short Term: Able to state/look up THRR;Short Term: Able to use daily as guideline for intensity in rehab;Long Term: Able to use THRR to govern intensity when exercising independently Short Term: Able to state/look up THRR;Short Term: Able to use daily as guideline for intensity in rehab;Long Term: Able to use THRR to govern intensity when exercising independently Short Term: Able to state/look up THRR;Short Term: Able to use daily as guideline for intensity in rehab;Long Term: Able to use THRR to govern intensity when exercising independently Short Term: Able to state/look up THRR;Short Term: Able to use daily as guideline for intensity in rehab;Long Term: Able to use THRR to govern intensity when exercising independently    Able to check pulse independently Yes Yes Yes Yes    Intervention Provide education and demonstration on how to check pulse in carotid and radial arteries.;Review the  importance of being able to check your own pulse for safety during independent exercise Provide education and demonstration on how to check pulse in carotid and radial arteries.;Review the importance of being able to check your own pulse for safety during independent exercise Provide education and demonstration on how to check pulse in carotid and radial arteries.;Review the importance of being able to check your own pulse for safety during independent exercise Provide education and demonstration on how to check pulse in carotid and radial arteries.;Review the importance of being able to check your own pulse for safety during independent exercise    Expected Outcomes Short Term: Able to explain why pulse checking is important during independent exercise;Long Term: Able to check pulse independently and accurately Short Term: Able to explain why pulse checking is important during independent exercise;Long Term: Able to check pulse independently and accurately Short Term: Able to explain why pulse checking is important during independent exercise;Long Term: Able to check pulse independently and accurately Short Term: Able to explain why pulse checking is important during independent exercise;Long Term: Able to check pulse independently and accurately    Understanding of Exercise Prescription Yes Yes Yes Yes    Intervention Provide education, explanation, and written materials on patient's individual exercise prescription Provide education, explanation, and written materials on patient's individual exercise prescription Provide education, explanation, and written materials on patient's individual exercise prescription Provide education, explanation, and written materials on patient's individual exercise prescription  Expected Outcomes Short Term: Able to explain program exercise prescription;Long Term: Able to explain home exercise prescription to exercise independently Short Term: Able to explain program exercise  prescription;Long Term: Able to explain home exercise prescription to exercise independently Short Term: Able to explain program exercise prescription;Long Term: Able to explain home exercise prescription to exercise independently Short Term: Able to explain program exercise prescription;Long Term: Able to explain home exercise prescription to exercise independently           Exercise Goals Re-Evaluation :  Exercise Goals Re-Evaluation    Row Name 11/26/19 1428 12/24/19 1036 01/21/20 1245         Exercise Goal Re-Evaluation   Exercise Goals Review Increase Physical Activity;Increase Strength and Stamina;Able to understand and use rate of perceived exertion (RPE) scale;Knowledge and understanding of Target Heart Rate Range (THRR);Able to check pulse independently;Understanding of Exercise Prescription Increase Physical Activity;Increase Strength and Stamina;Able to understand and use rate of perceived exertion (RPE) scale;Knowledge and understanding of Target Heart Rate Range (THRR);Able to check pulse independently;Understanding of Exercise Prescription Increase Physical Activity;Increase Strength and Stamina;Able to understand and use rate of perceived exertion (RPE) scale;Knowledge and understanding of Target Heart Rate Range (THRR);Able to check pulse independently;Understanding of Exercise Prescription     Comments Pt has attended 5 exercise sessions. During his last session, he was found to be in newly onset atrial fibrillation. He has been advised to not return to rehab by his MD until his follow up visit on 8/4. Before this, he was progressing well and had a positive outlook. He currently exercises at 4.3 METs on the elliptical. Will continue to monitor and progress as able. Pt has completed 9 exercise sessions. He missed a few weeks due to a new diagnosis of Afib. He has continued to progress well even with his absences. He currently exercises at 4.7 METs on the elliptical. Will continue to  monitor and progress as able. Pt has completed 19 exercise sessions. His attendance has been excellent since being placed on medications for his Afib. He continues to progress and is currently exercising at 5.2 METs on the stepper. Will continue to monitor and progress as able.     Expected Outcomes Pt will continue to progress towards his goals and begin a home exercise program. Pt will continue to progress towards his goals and begin a home exercise program. Through exercise at rehab and by engaging in a home exercise plan, the patient will be able to reach their goals.             Discharge Exercise Prescription (Final Exercise Prescription Changes):  Exercise Prescription Changes - 01/16/20 1200      Response to Exercise   Blood Pressure (Admit) 140/90    Blood Pressure (Exercise) 140/84    Blood Pressure (Exit) 112/80    Heart Rate (Admit) 88 bpm    Heart Rate (Exercise) 110 bpm    Heart Rate (Exit) 97 bpm    Rating of Perceived Exertion (Exercise) 14    Duration Continue with 30 min of aerobic exercise without signs/symptoms of physical distress.    Intensity THRR unchanged      Progression   Progression Continue to progress workloads to maintain intensity without signs/symptoms of physical distress.      Resistance Training   Training Prescription Yes    Weight 4 lbs    Reps 10-15      Treadmill   MPH 2.8    Grade 5    Minutes 22  METs 5.05      Recumbant Elliptical   Level 3    RPM 67    Watts 97    Minutes 17    METs 5.5           Nutrition:  Target Goals: Understanding of nutrition guidelines, daily intake of sodium 1500mg , cholesterol 200mg , calories 30% from fat and 7% or less from saturated fats, daily to have 5 or more servings of fruits and vegetables.  Biometrics:  Pre Biometrics - 11/02/19 1452      Pre Biometrics   Height 6\' 2"  (1.88 m)    Weight 90.9 kg    Waist Circumference 38 inches    Hip Circumference 40 inches    Waist to Hip  Ratio 0.95 %    BMI (Calculated) 25.72    Triceps Skinfold 5 mm    % Body Fat 20.5 %    Grip Strength 39.6 kg    Flexibility 0 in    Single Leg Stand 2.53 seconds            Nutrition Therapy Plan and Nutrition Goals:  Nutrition Therapy & Goals - 01/21/20 1316      Personal Nutrition Goals   Comments Patient continues to say he is eating heart healthy eating a lot of fresh fruits and vegetables with no pork or beef. Will continue to monitor.      Intervention Plan   Intervention Nutrition handout(s) given to patient.           Nutrition Assessments:  Nutrition Assessments - 11/02/19 1430      MEDFICTS Scores   Pre Score 6           Nutrition Goals Re-Evaluation:   Nutrition Goals Discharge (Final Nutrition Goals Re-Evaluation):   Psychosocial: Target Goals: Acknowledge presence or absence of significant depression and/or stress, maximize coping skills, provide positive support system. Participant is able to verbalize types and ability to use techniques and skills needed for reducing stress and depression.  Initial Review & Psychosocial Screening:  Initial Psych Review & Screening - 11/02/19 1439      Initial Review   Current issues with None Identified      Family Dynamics   Good Support System? Yes      Barriers   Psychosocial barriers to participate in program There are no identifiable barriers or psychosocial needs.      Screening Interventions   Interventions Encouraged to exercise;Provide feedback about the scores to participant    Expected Outcomes Short Term goal: Identification and review with participant of any Quality of Life or Depression concerns found by scoring the questionnaire.;Long Term goal: The participant improves quality of Life and PHQ9 Scores as seen by post scores and/or verbalization of changes           Quality of Life Scores:  Quality of Life - 11/02/19 1459      Quality of Life   Select Quality of Life      Quality of  Life Scores   Health/Function Pre 23.5 %    Socioeconomic Pre 21.71 %    Psych/Spiritual Pre 17.33 %    Family Pre 25.4 %    GLOBAL Pre 22.29 %          Scores of 19 and below usually indicate a poorer quality of life in these areas.  A difference of  2-3 points is a clinically meaningful difference.  A difference of 2-3 points in the total score of the Quality  of Life Index has been associated with significant improvement in overall quality of life, self-image, physical symptoms, and general health in studies assessing change in quality of life.  PHQ-9: Recent Review Flowsheet Data    Depression screen [facility] 2/9 11/02/2019 10/03/2019 06/27/2019   Decreased Interest 0 0 0   Down, Depressed, Hopeless 0 0 0   PHQ - 2 Score 0 0 0   Altered sleeping 0 3 -   Tired, decreased energy 0 0 -   Change in appetite 0 0 -   Feeling bad or failure about yourself  0 0 -   Trouble concentrating 0 0 -   Moving slowly or fidgety/restless 0 0 -   Suicidal thoughts 0 0 -   PHQ-9 Score 0 3 -   Difficult doing work/chores Not difficult at all Not difficult at all -     Interpretation of Total Score  Total Score Depression Severity:  1-4 = Minimal depression, 5-9 = Mild depression, 10-14 = Moderate depression, 15-19 = Moderately severe depression, 20-27 = Severe depression   Psychosocial Evaluation and Intervention:  Psychosocial Evaluation - 11/02/19 1440      Psychosocial Evaluation & Interventions   Interventions Encouraged to exercise with the program and follow exercise prescription;Stress management education;Relaxation education    Comments Patient has no psychosocial issues identified at his orientation visit. His QOL score was 22.29 and his PHQ-9 score was 0. He has good family support.    Expected Outcomes Patient will have no psychosocial issues identified at discharge.    Continue Psychosocial Services  No Follow up required           Psychosocial Re-Evaluation:  Psychosocial  Re-Evaluation    [place] Name 11/26/19 1419 12/24/19 1248 01/21/20 1318         Psychosocial Re-Evaluation   Current issues with None Identified None Identified --     Comments Patient's initial QOL score was 22.29 and his PHQ-9 score was 0 with no psychosocial issues identified. Patient continues to have no psychosocial issues identified. Will continue to monitor. Patient continues to have no psychosocial issues identified. Will continue to monitor.     Expected Outcomes Patient will have no psychosocial issues identifed at discharge. Patient will have no psychosocial issues identifed at discharge. Patient will have no psychosocial issues identifed at discharge.     Interventions Stress management education;Encouraged to attend Cardiac Rehabilitation for the exercise;Relaxation education Stress management education;Encouraged to attend Cardiac Rehabilitation for the exercise;Relaxation education Stress management education;Encouraged to attend Cardiac Rehabilitation for the exercise;Relaxation education     Continue Psychosocial Services  No Follow up required No Follow up required No Follow up required            Psychosocial Discharge (Final Psychosocial Re-Evaluation):  Psychosocial Re-Evaluation - 01/21/20 1318      Psychosocial Re-Evaluation   Comments Patient continues to have no psychosocial issues identified. Will continue to monitor.    Expected Outcomes Patient will have no psychosocial issues identifed at discharge.    Interventions Stress management education;Encouraged to attend Cardiac Rehabilitation for the exercise;Relaxation education    Continue Psychosocial Services  No Follow up required           Vocational Rehabilitation: Provide vocational rehab assistance to qualifying candidates.   Vocational Rehab Evaluation & Intervention:  Vocational Rehab - 11/02/19 1431      Vocational Rehab Re-[facility]   Comments Patient plans to return to full-time employment next  week. He does not need vocational  rehab.           Education: Education Goals: Education classes will be provided on a weekly basis, covering required topics. Participant will state understanding/return demonstration of topics presented.  Learning Barriers/Preferences:  Learning Barriers/Preferences - 11/02/19 1431      Learning Barriers/Preferences   Learning Barriers None    Learning Preferences Written Material;Skilled Demonstration           Education Topics: Hypertension, Hypertension Reduction -Define heart disease and high blood pressure. Discus how high blood pressure affects the body and ways to reduce high blood pressure.   Exercise and Your Heart -Discuss why it is important to exercise, the FITT principles of exercise, normal and abnormal responses to exercise, and how to exercise safely.   CARDIAC REHAB PHASE II EXERCISE from 01/23/2020 in [place]  Date 11/07/19  Educator DJ  Instruction Review Code 1- Verbalizes Understanding      Angina -Discuss definition of angina, causes of angina, treatment of angina, and how to decrease risk of having angina.   CARDIAC REHAB PHASE II EXERCISE from 01/23/2020 in [place]  Date 11/14/19  Educator [place]  Instruction Review Code 1- Verbalizes Understanding      Cardiac Medications -Review what the following cardiac medications are used for, how they affect the body, and side effects that may occur when taking the medications.  Medications include Aspirin, Beta blockers, calcium channel blockers, ACE Inhibitors, angiotensin receptor blockers, diuretics, digoxin, and antihyperlipidemics.   Congestive Heart Failure -Discuss the definition of CHF, how to live with CHF, the signs and symptoms of CHF, and how keep track of weight and sodium intake.   Heart Disease and Intimacy -Discus the effect sexual activity has on the heart, how changes occur during intimacy as we age, and  safety during sexual activity.   Smoking Cessation / COPD -Discuss different methods to quit smoking, the health benefits of quitting smoking, and the definition of COPD.   CARDIAC REHAB PHASE II EXERCISE from 01/23/2020 in [place]  Date 12/12/19  Educator DF  Instruction Review Code 2- Demonstrated Understanding      Nutrition I: Fats -Discuss the types of cholesterol, what cholesterol does to the heart, and how cholesterol levels can be controlled.   Nutrition II: Labels -Discuss the different components of food labels and how to read food label   CARDIAC REHAB PHASE II EXERCISE from 01/23/2020 in [place]  Date 12/26/19  Educator DF  Instruction Review Code 1- Verbalizes Understanding      Heart Parts/Heart Disease and PAD -Discuss the anatomy of the heart, the pathway of blood circulation through the heart, and these are affected by heart disease.   Stress I: Signs and Symptoms -Discuss the causes of stress, how stress may lead to anxiety and depression, and ways to limit stress.   CARDIAC REHAB PHASE II EXERCISE from 01/23/2020 in [place]  Date 01/09/20  Educator DF  Instruction Review Code 2- Demonstrated Understanding      Stress II: Relaxation -Discuss different types of relaxation techniques to limit stress.   CARDIAC REHAB PHASE II EXERCISE from 01/23/2020 in [place]  Date 01/16/20  Educator DF  Instruction Review Code 2- Demonstrated Understanding      Warning Signs of Stroke / TIA -Discuss definition of a stroke, what the signs and symptoms are of a stroke, and how to identify when someone is having stroke.   CARDIAC  REHAB PHASE II EXERCISE from 01/23/2020 in [place]  Date 01/23/20  Educator DJ  Instruction Review Code 1- Verbalizes Understanding      Knowledge Questionnaire Score:  Knowledge Questionnaire Score - 11/02/19  1431      Knowledge Questionnaire Score   Pre Score 28/28           Core Components/Risk Factors/Patient Goals at Admission:  Personal Goals and Risk Factors at Admission - 11/02/19 1431      Core Components/Risk Factors/Patient Goals on Admission    Weight Management Weight Maintenance    Lipids Yes    Intervention Provide education and support for participant on nutrition & aerobic/resistive exercise along with prescribed medications to achieve LDL 70mg , HDL >40mg .    Expected Outcomes Short Term: Participant states understanding of desired cholesterol values and is compliant with medications prescribed. Participant is following exercise prescription and nutrition guidelines.    Personal Goal Other Yes    Personal Goal Get his heart healthy and be health overall.    Intervention Patient will attend CR 3 days/week and supplement with exercise at home 2 days/week.    Expected Outcomes Patient will meet both program and personal goals.           Core Components/Risk Factors/Patient Goals Review:   Goals and Risk Factor Review    Row Name 11/26/19 1415 12/24/19 1249 01/21/20 1316         Core Components/Risk Factors/Patient Goals Review   Personal Goals Review Weight Management/Obesity  Get heart healthy and strong; get healthier overall. Weight Management/Obesity  Get heart healthy and strong. Get healthy overall. Weight Management/Obesity  Get heart healthy and strong; get healthier overall.     Review Patient has completed 5 complete sessions maintaining his weight. He was doing well in the program. On his 6 sessions, he was in A-Fib from the onset with a HR of 140-145. He was sent for a ECG whic confirmed A-Fib with rapid ventricular response. He was advised by his cardiologist Dr. [name] not to return to CR until he was seen by cardiology 11/28/19. Will continue to monitor. Patient has completed 10 sessions losing 1 lb since last 30 day review. His attendance has not been  consistent which has impeded his progress in the program. He continues to be in NSR with controlled blood pressure readings. He says he is feeling stronger and is able to work full-time without difficulty. Hopefully his attendance will improve and become more consistent. Will continue to monitor for progress. Patient has completed 20 sessions losing 3 lbs since last 30 day review. He continues to do well in the program with consistent attendance this review period. He continues in NSR with controlled blood pressure and continues to work full-time without difficulty. He continues to report feeling stronger and better overall. Will continue to montior for progress.     Expected Outcomes Patient will continue to attend sessions and complete the program meeting both program and personal goals. Patient will continue to attend sessions and complete the program meeting both program and personal goals. Patient will continue to attend sessions and complete the program meeting both program and personal goals.            Core Components/Risk Factors/Patient Goals at Discharge (Final Review):   Goals and Risk Factor Review - 01/21/20 1316      Core Components/Risk Factors/Patient Goals Review   Personal Goals Review Weight Management/Obesity   Get heart healthy and strong; get healthier overall.  Review Patient has completed 20 sessions losing 3 lbs since last 30 day review. He continues to do well in the program with consistent attendance this review period. He continues in NSR with controlled blood pressure and continues to work full-time without difficulty. He continues to report feeling stronger and better overall. Will continue to montior for progress.    Expected Outcomes Patient will continue to attend sessions and complete the program meeting both program and personal goals.           ITP Comments:   Comments: ITP REVIEW Pt is making expected progress toward Cardiac Rehab goals after completing  19 sessions. Recommend continued exercise, life style modification, education, and increased stamina and strength.

## 2020-01-25 ENCOUNTER — Encounter (HOSPITAL_COMMUNITY)
Admission: RE | Admit: 2020-01-25 | Discharge: 2020-01-25 | Disposition: A | Discharge status: Home / Self Care | Payer: BC Managed Care – PPO | Source: Ambulatory Visit | Attending: Cardiology | Admitting: Cardiology

## 2020-01-25 ENCOUNTER — Other Ambulatory Visit: Payer: Self-pay

## 2020-01-25 DIAGNOSIS — Z952 Presence of prosthetic heart valve: Secondary | ICD-10-CM | POA: Insufficient documentation

## 2020-01-25 NOTE — Progress Notes (Signed)
Daily Session Note  Patient Details  Name: Donald [name] MRN: [id number] Date of Birth: 1966-02-17 Referring Provider:     CARDIAC REHAB PHASE II ORIENTATION from 11/02/2019 in [place]  Referring Provider [name]      Encounter Date: 01/25/2020  Check In:  Session Check In - 01/25/20 4621      Check-In   Staff Present Ramon Dredge, RN, MHA;Debra Wynetta Emery, RN, BSN    Virtual Visit No    Medication changes reported     No    Fall or balance concerns reported    No    Tobacco Cessation No Change    Warm-up and Cool-down Performed as group-led instruction    Resistance Training Performed Yes    VAD Patient? No    PAD/SET Patient? No      Pain Assessment   Currently in Pain? No/denies    Pain Score 0-No pain    Multiple Pain Sites No           Capillary Blood Glucose: No results found for this or any previous visit (from the past 24 hour(s)).    Social History   Tobacco Use  Smoking Status Former Smoker  . Packs/day: 0.25  . Types: Cigarettes  . Quit date: 08/02/2019  . Years since quitting: 0.4  Smokeless Tobacco Never Used  Tobacco Comment   Cutting down < 5 cig / day    Goals Met:  Independence with exercise equipment Exercise tolerated well No report of cardiac concerns or symptoms Strength training completed today  Goals Unmet:  Not Applicable  Comments: [phone]   Dr. Kathie Dike is Medical Director for [facility] Pulmonary Re[facility].

## 2020-01-28 ENCOUNTER — Encounter (HOSPITAL_COMMUNITY)
Admission: RE | Admit: 2020-01-28 | Discharge: 2020-01-28 | Disposition: A | Discharge status: Home / Self Care | Payer: BC Managed Care – PPO | Source: Ambulatory Visit | Attending: Cardiology | Admitting: Cardiology

## 2020-01-28 ENCOUNTER — Other Ambulatory Visit: Payer: Self-pay

## 2020-01-28 DIAGNOSIS — Z952 Presence of prosthetic heart valve: Secondary | ICD-10-CM | POA: Diagnosis not present

## 2020-01-28 NOTE — Progress Notes (Signed)
Daily Session Note  Patient Details  Name: Donald [name] MRN: [id number] Date of Birth: [birth year] Referring Provider:     CARDIAC REHAB PHASE II ORIENTATION from 11/02/2019 in [place]  Referring Provider [name]      Encounter Date: 01/28/2020  Check In:  Session Check In - 01/28/20 9145      Check-In   Supervising physician immediately available to respond to emergencies See telemetry face sheet for immediately available MD    Location [facility]    Staff Present Hoy Register, MS, ACSM-CEP, Exercise Physiologist;Debra Wynetta Emery, RN, BSN    Virtual Visit No    Medication changes reported     No    Fall or balance concerns reported    No    Tobacco Cessation No Change    Warm-up and Cool-down Performed as group-led instruction    Resistance Training Performed Yes    VAD Patient? No    PAD/SET Patient? No      Pain Assessment   Currently in Pain? No/denies    Pain Score 0-No pain    Multiple Pain Sites No           Capillary Blood Glucose: No results found for this or any previous visit (from the past 24 hour(s)).    Social History   Tobacco Use  Smoking Status Former Smoker  . Packs/day: 0.25  . Types: Cigarettes  . Quit date: 08/02/2019  . Years since quitting: 0.4  Smokeless Tobacco Never Used  Tobacco Comment   Cutting down < 5 cig / day    Goals Met:  Independence with exercise equipment Exercise tolerated well No report of cardiac concerns or symptoms Strength training completed today  Goals Unmet:  Not Applicable  Comments: checkout time is 1030   Dr. Kathie Dike is Medical Director for [facility] Pulmonary Re[facility].

## 2020-01-30 ENCOUNTER — Encounter (HOSPITAL_COMMUNITY)
Admission: RE | Admit: 2020-01-30 | Discharge: 2020-01-30 | Disposition: A | Discharge status: Home / Self Care | Payer: BC Managed Care – PPO | Source: Ambulatory Visit | Attending: Cardiology | Admitting: Cardiology

## 2020-01-30 ENCOUNTER — Other Ambulatory Visit: Payer: Self-pay

## 2020-01-30 VITALS — Wt 200.0 lb

## 2020-01-30 DIAGNOSIS — Z952 Presence of prosthetic heart valve: Secondary | ICD-10-CM | POA: Diagnosis not present

## 2020-01-30 NOTE — Progress Notes (Signed)
Daily Session Note  Patient Details  Name: MAXIMILIANO CROMARTIE MRN: [id number] Date of Birth: 1965-07-19 Referring Provider:     CARDIAC REHAB PHASE II ORIENTATION from 11/02/2019 in [place]  Referring Provider [name]      Encounter Date: 01/30/2020  Check In:  Session Check In - 01/30/20 0071      Check-In   Supervising physician immediately available to respond to emergencies See telemetry face sheet for immediately available MD    Location [facility]    Staff Present Hoy Register, MS, ACSM-CEP, Exercise Physiologist;Debra Wynetta Emery, RN, BSN    Virtual Visit No    Medication changes reported     No    Fall or balance concerns reported    No    Tobacco Cessation No Change    Warm-up and Cool-down Performed as group-led instruction    Resistance Training Performed Yes    VAD Patient? No    PAD/SET Patient? No      Pain Assessment   Currently in Pain? No/denies    Pain Score 0-No pain    Multiple Pain Sites No           Capillary Blood Glucose: No results found for this or any previous visit (from the past 24 hour(s)).    Social History   Tobacco Use  Smoking Status Former Smoker  . Packs/day: 0.25  . Types: Cigarettes  . Quit date: 08/02/2019  . Years since quitting: 0.4  Smokeless Tobacco Never Used  Tobacco Comment   Cutting down < 5 cig / day    Goals Met:  Independence with exercise equipment Exercise tolerated well No report of cardiac concerns or symptoms Strength training completed today  Goals Unmet:  Not Applicable  Comments: checkout time is 1030   Dr. Kathie Dike is Medical Director for [facility].

## 2020-02-01 ENCOUNTER — Encounter (HOSPITAL_COMMUNITY)
Admission: RE | Admit: 2020-02-01 | Discharge: 2020-02-01 | Disposition: A | Discharge status: Home / Self Care | Payer: BC Managed Care – PPO | Source: Ambulatory Visit | Attending: Cardiology | Admitting: Cardiology

## 2020-02-01 ENCOUNTER — Other Ambulatory Visit: Payer: Self-pay

## 2020-02-01 VITALS — Ht 72.0 in | Wt 199.7 lb

## 2020-02-01 DIAGNOSIS — Z952 Presence of prosthetic heart valve: Secondary | ICD-10-CM | POA: Diagnosis not present

## 2020-02-01 NOTE — Progress Notes (Signed)
Daily Session Note  Patient Details  Name: Donald [name] MRN: [id number] Date of Birth: 08/02/1965 Referring Provider:     CARDIAC REHAB PHASE II ORIENTATION from 11/02/2019 in [place] [facility]  Referring Provider [place]      Encounter Date: 02/01/2020  Check In:  Session Check In - 02/01/20 1000      Check-In   Supervising physician immediately available to respond to emergencies See telemetry face sheet for immediately available MD    Location [facility]    Staff Present Dalton Fletcher, MS, ACSM-CEP, Exercise Physiologist;Debra Johnson, RN, BSN    Virtual Visit No    Medication changes reported     No    Fall or balance concerns reported    No    Tobacco Cessation No Change    Warm-up and Cool-down Performed as group-led instruction    Resistance Training Performed Yes    VAD Patient? No    PAD/SET Patient? No      Pain Assessment   Currently in Pain? No/denies    Pain Score 0-No pain    Multiple Pain Sites No           Capillary Blood Glucose: No results found for this or any previous visit (from the past 24 hour(s)).    Social History   Tobacco Use  Smoking Status Former Smoker  . Packs/day: 0.25  . Types: Cigarettes  . Quit date: 08/02/2019  . Years since quitting: 0.5  Smokeless Tobacco Never Used  Tobacco Comment   Cutting down < 5 cig / day    Goals Met:  Independence with exercise equipment Exercise tolerated well No report of cardiac concerns or symptoms Strength training completed today  Goals Unmet:  Not Applicable  Comments: checkout time is 1030   Dr. Jehanzeb Memon is Medical Director for [facility]. 

## 2020-02-04 ENCOUNTER — Other Ambulatory Visit: Payer: Self-pay

## 2020-02-04 ENCOUNTER — Encounter (HOSPITAL_COMMUNITY)
Admission: RE | Admit: 2020-02-04 | Discharge: 2020-02-04 | Disposition: A | Discharge status: Home / Self Care | Payer: BC Managed Care – PPO | Source: Ambulatory Visit | Attending: Cardiology | Admitting: Cardiology

## 2020-02-04 ENCOUNTER — Telehealth: Payer: Self-pay | Admitting: Cardiology

## 2020-02-04 DIAGNOSIS — Z952 Presence of prosthetic heart valve: Secondary | ICD-10-CM

## 2020-02-04 NOTE — Telephone Encounter (Signed)
New message    Patient needs work note lifting all restrictions , will come back to pick up

## 2020-02-04 NOTE — Telephone Encounter (Signed)
I will forward to B.Ahmed Prima, PA-C who wrote the most recent work note.

## 2020-02-04 NOTE — Progress Notes (Signed)
Daily Session Note  Patient Details  Name: AHMANI PREHN MRN: [id number] Date of Birth: February 19, 1966 Referring Provider:     CARDIAC REHAB PHASE II ORIENTATION from 11/02/2019 in [place]  Referring Provider [name]      Encounter Date: 02/04/2020  Check In:  Session Check In - 02/04/20 0958      Check-In   Supervising physician immediately available to respond to emergencies See telemetry face sheet for immediately available MD    Location [facility]    Staff Present Hoy Register, MS, ACSM-CEP, Exercise Physiologist;Debra Wynetta Emery, RN, BSN    Virtual Visit No    Medication changes reported     No    Fall or balance concerns reported    No    Tobacco Cessation No Change    Warm-up and Cool-down Performed as group-led instruction    Resistance Training Performed Yes    VAD Patient? No    PAD/SET Patient? No      Pain Assessment   Currently in Pain? No/denies    Pain Score 0-No pain    Multiple Pain Sites No           Capillary Blood Glucose: No results found for this or any previous visit (from the past 24 hour(s)).    Social History   Tobacco Use  Smoking Status Former Smoker  . Packs/day: 0.25  . Types: Cigarettes  . Quit date: 08/02/2019  . Years since quitting: 0.5  Smokeless Tobacco Never Used  Tobacco Comment   Cutting down < 5 cig / day    Goals Met:  Independence with exercise equipment Exercise tolerated well No report of cardiac concerns or symptoms Strength training completed today  Goals Unmet:  Not Applicable  Comments: checkout time 1030   Dr. Kathie Dike is Medical Director for [facility] Pul[facility] Re[facility].

## 2020-02-04 NOTE — Telephone Encounter (Signed)
    He is now 6 months out from [facility]. Can provide work note he can return with no restrictions in place from a cardiac perspective.   Signed, Erma Heritage, PA-C 02/04/2020, 7:24 PM Pager: [phone]

## 2020-02-04 NOTE — Progress Notes (Addendum)
Discharge Progress Report  Patient Details  Name: Donald [name] MRN: [id number] Date of Birth: Mar 16, 1966 Referring Provider:     CARDIAC REHAB PHASE II ORIENTATION from 11/02/2019 in [place]  Referring Provider [name]       Number of Visits: 26  Reason for Discharge:  Patient reached a stable level of exercise. Patient independent in their exercise. Patient has met program and personal goals.  Smoking History:  Social History   Tobacco Use  Smoking Status Former Smoker  . Packs/day: 0.25  . Types: Cigarettes  . Quit date: 08/02/2019  . Years since quitting: 0.5  Smokeless Tobacco Never Used  Tobacco Comment   Cutting down < 5 cig / day    Diagnosis:  S/P aortic valve replacement  ADL UCSD:   Initial Exercise Prescription:  Initial Exercise Prescription - 11/02/19 1400      Date of Initial Exercise RX and Referring Provider   Date 11/02/19    Referring Provider [name]    Expected Discharge Date 02/02/20      Treadmill   MPH 1.5    Grade 0    Minutes 17    METs 2.14      Recumbant Elliptical   Level 1    RPM 60    Minutes 22    METs --      Prescription Details   Frequency (times per week) 3    Duration Progress to 30 minutes of continuous aerobic without signs/symptoms of physical distress      Intensity   THRR 40-80% of Max Heartrate 67-134    Ratings of Perceived Exertion 11-13    Perceived Dyspnea 0-4      Progression   Progression Continue progressive overload as per policy without signs/symptoms or physical distress.      Resistance Training   Training Prescription Yes    Weight 2    Reps 10-15           Discharge Exercise Prescription (Final Exercise Prescription Changes):  Exercise Prescription Changes - 01/30/20 1200      Response to Exercise   Blood Pressure (Admit) 122/78    Blood Pressure (Exercise) 142/86    Blood Pressure (Exit) 124/78    Heart Rate (Admit) 98 bpm    Heart Rate (Exercise)  117 bpm    Heart Rate (Exit) 107 bpm    Rating of Perceived Exertion (Exercise) 14    Duration Continue with 30 min of aerobic exercise without signs/symptoms of physical distress.    Intensity THRR unchanged      Progression   Progression Continue to progress workloads to maintain intensity without signs/symptoms of physical distress.      Resistance Training   Training Prescription Yes    Weight 4 lbs    Reps 10-15    Time 10 Minutes      Treadmill   MPH 2.8    Grade 7    Minutes 22    METs 5.82      Recumbant Elliptical   Level 4    RPM 69    Minutes 17    METs 4.5           Functional Capacity:  6 Minute Walk    Row Name 11/02/19 1434 02/01/20 1039       6 Minute Walk   Phase Initial Discharge    Distance 1300 feet 1675 feet    Distance % Change -- 28.85 %    Distance Feet Change --  375 ft    Walk Time 6 minutes 6 minutes    # of Rest Breaks 0 0    MPH 2.46 3.17    METS 4.05 4.85    RPE 9 9    VO2 Peak 14.18 16.98    Symptoms No No    Resting HR 81 bpm 91 bpm    Resting BP 110/80 120/70    Resting Oxygen Saturation  96 % 97 %    Exercise Oxygen Saturation  during 6 min walk 96 % 97 %    Max Ex. HR 104 bpm 101 bpm    Max Ex. BP 118/82 140/72    2 Minute Post BP 130/80 112/78           Psychological, QOL, Others - Outcomes: PHQ 2/9: Depression screen [facility] 2/9 01/31/2020 11/02/2019 10/03/2019 06/27/2019  Decreased Interest 0 0 0 0  Down, Depressed, Hopeless 0 0 0 0  PHQ - 2 Score 0 0 0 0  Altered sleeping 1 0 3 -  Tired, decreased energy 3 0 0 -  Change in appetite 0 0 0 -  Feeling bad or failure about yourself  0 0 0 -  Trouble concentrating 0 0 0 -  Moving slowly or fidgety/restless 0 0 0 -  Suicidal thoughts 0 0 0 -  PHQ-9 Score 4 0 3 -  Difficult doing work/chores Not difficult at all Not difficult at all Not difficult at all -    Quality of Life:  Quality of Life - 01/31/20 0825      Quality of Life   Select Quality of Life      Quality  of Life Scores   Health/Function Pre 23.5 %    Health/Function Post 25.17 %    Health/Function % Change 7.11 %    Socioeconomic Pre 21.71 %    Socioeconomic Post 20.07 %    Socioeconomic % Change  -7.55 %    Psych/Spiritual Pre 17.33 %    Psych/Spiritual Post 20.75 %    Psych/Spiritual % Change 19.73 %    Family Pre 25.4 %    Family Post 19.2 %    Family % Change -24.41 %    GLOBAL Pre 22.29 %    GLOBAL Post 22.38 %    GLOBAL % Change 0.4 %           Personal Goals: Goals established at orientation with interventions provided to work toward goal.  Personal Goals and Risk Factors at Admission - 11/02/19 1431      Core Components/Risk Factors/Patient Goals on Admission    Weight Management Weight Maintenance    Lipids Yes    Intervention Provide education and support for participant on nutrition & aerobic/resistive exercise along with prescribed medications to achieve LDL <75m, HDL >437m    Expected Outcomes Short Term: Participant states understanding of desired cholesterol values and is compliant with medications prescribed. Participant is following exercise prescription and nutrition guidelines.    Personal Goal Other Yes    Personal Goal Get his heart healthy and be health overall.    Intervention Patient will attend CR 3 days/week and supplement with exercise at home 2 days/week.    Expected Outcomes Patient will meet both program and personal goals.            Personal Goals Discharge:  Goals and Risk Factor Review    Row Name 11/26/19 1415 12/24/19 1249 01/21/20 1316 02/04/20 1546       Core Components/Risk  Factors/Patient Goals Review   Personal Goals Review Weight Management/Obesity  Get heart healthy and strong; get healthier overall. Weight Management/Obesity  Get heart healthy and strong. Get healthy overall. Weight Management/Obesity  Get heart healthy and strong; get healthier overall. Weight Management/Obesity  Get heart healthy and strong; get healthier  overall.    Review Patient has completed 5 complete sessions maintaining his weight. He was doing well in the program. On his 6 sessions, he was in A-Fib from the onset with a HR of 140-145. He was sent for a ECG whic confirmed A-Fib with rapid ventricular response. He was advised by his cardiologist Dr. [name] not to return to CR until he was seen by cardiology 11/28/19. Will continue to monitor. Patient has completed 10 sessions losing 1 lb since last 30 day review. His attendance has not been consistent which has impeded his progress in the program. He continues to be in NSR with controlled blood pressure readings. He says he is feeling stronger and is able to work full-time without difficulty. Hopefully his attendance will improve and become more consistent. Will continue to monitor for progress. Patient has completed 20 sessions losing 3 lbs since last 30 day review. He continues to do well in the program with consistent attendance this review period. He continues in NSR with controlled blood pressure and continues to work full-time without difficulty. He continues to report feeling stronger and better overall. Will continue to montior for progress. Patient completed the program with 26 sessions maintaining his weight throughout the program. His exit measurements improved in grip strength and flexibility. His exit walk test imprvoved by 28.85%. He is working fulltime without difficulty. He has done very well in the program meeting both his personal and program goals. He reports feeling "absolutely" better than when he started the program. he also says he the program sparked and interest to set and acheive higher goals from himself and has helped give him confidence and a desire to push himself to reclaim and better his life. He says he has a clearer understanding of his heart and his medications. He is able to fo more outdoors with his toddler and easing physical restrictions at work. He plans to join a gym to  continue exercise. CR will f/u with phone call.    Expected Outcomes Patient will continue to attend sessions and complete the program meeting both program and personal goals. Patient will continue to attend sessions and complete the program meeting both program and personal goals. Patient will continue to attend sessions and complete the program meeting both program and personal goals. Patient will continue to exercise at a local gym and continue to achieve his goals.           Exercise Goals and Review:  Exercise Goals    Row Name 11/02/19 1450 11/26/19 1428 12/24/19 1036 01/21/20 1245       Exercise Goals   Increase Physical Activity Yes Yes Yes Yes    Intervention Provide advice, education, support and counseling about physical activity/exercise needs.;Develop an individualized exercise prescription for aerobic and resistive training based on initial evaluation findings, risk stratification, comorbidities and participant's personal goals. Provide advice, education, support and counseling about physical activity/exercise needs.;Develop an individualized exercise prescription for aerobic and resistive training based on initial evaluation findings, risk stratification, comorbidities and participant's personal goals. Provide advice, education, support and counseling about physical activity/exercise needs.;Develop an individualized exercise prescription for aerobic and resistive training based on initial evaluation findings, risk stratification, comorbidities and  participant's personal goals. Provide advice, education, support and counseling about physical activity/exercise needs.;Develop an individualized exercise prescription for aerobic and resistive training based on initial evaluation findings, risk stratification, comorbidities and participant's personal goals.    Expected Outcomes Short Term: Attend rehab on a regular basis to increase amount of physical activity.;Long Term: Add in home exercise  to make exercise part of routine and to increase amount of physical activity.;Long Term: Exercising regularly at least 3-5 days a week. Short Term: Attend rehab on a regular basis to increase amount of physical activity.;Long Term: Add in home exercise to make exercise part of routine and to increase amount of physical activity.;Long Term: Exercising regularly at least 3-5 days a week. Short Term: Attend rehab on a regular basis to increase amount of physical activity.;Long Term: Add in home exercise to make exercise part of routine and to increase amount of physical activity.;Long Term: Exercising regularly at least 3-5 days a week. Short Term: Attend rehab on a regular basis to increase amount of physical activity.;Long Term: Add in home exercise to make exercise part of routine and to increase amount of physical activity.;Long Term: Exercising regularly at least 3-5 days a week.    Increase Strength and Stamina Yes Yes Yes Yes    Intervention Provide advice, education, support and counseling about physical activity/exercise needs.;Develop an individualized exercise prescription for aerobic and resistive training based on initial evaluation findings, risk stratification, comorbidities and participant's personal goals. Provide advice, education, support and counseling about physical activity/exercise needs.;Develop an individualized exercise prescription for aerobic and resistive training based on initial evaluation findings, risk stratification, comorbidities and participant's personal goals. Provide advice, education, support and counseling about physical activity/exercise needs.;Develop an individualized exercise prescription for aerobic and resistive training based on initial evaluation findings, risk stratification, comorbidities and participant's personal goals. Provide advice, education, support and counseling about physical activity/exercise needs.;Develop an individualized exercise prescription for  aerobic and resistive training based on initial evaluation findings, risk stratification, comorbidities and participant's personal goals.    Expected Outcomes Short Term: Increase workloads from initial exercise prescription for resistance, speed, and METs.;Short Term: Perform resistance training exercises routinely during rehab and add in resistance training at home;Long Term: Improve cardiorespiratory fitness, muscular endurance and strength as measured by increased METs and functional capacity (6MWT) Short Term: Increase workloads from initial exercise prescription for resistance, speed, and METs.;Short Term: Perform resistance training exercises routinely during rehab and add in resistance training at home;Long Term: Improve cardiorespiratory fitness, muscular endurance and strength as measured by increased METs and functional capacity (6MWT) Short Term: Increase workloads from initial exercise prescription for resistance, speed, and METs.;Short Term: Perform resistance training exercises routinely during rehab and add in resistance training at home;Long Term: Improve cardiorespiratory fitness, muscular endurance and strength as measured by increased METs and functional capacity (6MWT) Short Term: Increase workloads from initial exercise prescription for resistance, speed, and METs.;Short Term: Perform resistance training exercises routinely during rehab and add in resistance training at home;Long Term: Improve cardiorespiratory fitness, muscular endurance and strength as measured by increased METs and functional capacity (6MWT)    Able to understand and use rate of perceived exertion (RPE) scale Yes Yes Yes Yes    Intervention Provide education and explanation on how to use RPE scale Provide education and explanation on how to use RPE scale Provide education and explanation on how to use RPE scale Provide education and explanation on how to use RPE scale    Expected Outcomes Short Term: Able to  use RPE  daily in rehab to express subjective intensity level;Long Term:  Able to use RPE to guide intensity level when exercising independently Short Term: Able to use RPE daily in rehab to express subjective intensity level;Long Term:  Able to use RPE to guide intensity level when exercising independently Short Term: Able to use RPE daily in rehab to express subjective intensity level;Long Term:  Able to use RPE to guide intensity level when exercising independently Short Term: Able to use RPE daily in rehab to express subjective intensity level;Long Term:  Able to use RPE to guide intensity level when exercising independently    Knowledge and understanding of Target Heart Rate Range (THRR) Yes Yes Yes Yes    Intervention Provide education and explanation of THRR including how the numbers were predicted and where they are located for reference Provide education and explanation of THRR including how the numbers were predicted and where they are located for reference Provide education and explanation of THRR including how the numbers were predicted and where they are located for reference Provide education and explanation of THRR including how the numbers were predicted and where they are located for reference    Expected Outcomes Short Term: Able to state/look up THRR;Short Term: Able to use daily as guideline for intensity in rehab;Long Term: Able to use THRR to govern intensity when exercising independently Short Term: Able to state/look up THRR;Short Term: Able to use daily as guideline for intensity in rehab;Long Term: Able to use THRR to govern intensity when exercising independently Short Term: Able to state/look up THRR;Short Term: Able to use daily as guideline for intensity in rehab;Long Term: Able to use THRR to govern intensity when exercising independently Short Term: Able to state/look up THRR;Short Term: Able to use daily as guideline for intensity in rehab;Long Term: Able to use THRR to govern intensity  when exercising independently    Able to check pulse independently Yes Yes Yes Yes    Intervention Provide education and demonstration on how to check pulse in carotid and radial arteries.;Review the importance of being able to check your own pulse for safety during independent exercise Provide education and demonstration on how to check pulse in carotid and radial arteries.;Review the importance of being able to check your own pulse for safety during independent exercise Provide education and demonstration on how to check pulse in carotid and radial arteries.;Review the importance of being able to check your own pulse for safety during independent exercise Provide education and demonstration on how to check pulse in carotid and radial arteries.;Review the importance of being able to check your own pulse for safety during independent exercise    Expected Outcomes Short Term: Able to explain why pulse checking is important during independent exercise;Long Term: Able to check pulse independently and accurately Short Term: Able to explain why pulse checking is important during independent exercise;Long Term: Able to check pulse independently and accurately Short Term: Able to explain why pulse checking is important during independent exercise;Long Term: Able to check pulse independently and accurately Short Term: Able to explain why pulse checking is important during independent exercise;Long Term: Able to check pulse independently and accurately    Understanding of Exercise Prescription Yes Yes Yes Yes    Intervention Provide education, explanation, and written materials on patient's individual exercise prescription Provide education, explanation, and written materials on patient's individual exercise prescription Provide education, explanation, and written materials on patient's individual exercise prescription Provide education, explanation, and written materials on patient's  individual exercise prescription      Expected Outcomes Short Term: Able to explain program exercise prescription;Long Term: Able to explain home exercise prescription to exercise independently Short Term: Able to explain program exercise prescription;Long Term: Able to explain home exercise prescription to exercise independently Short Term: Able to explain program exercise prescription;Long Term: Able to explain home exercise prescription to exercise independently Short Term: Able to explain program exercise prescription;Long Term: Able to explain home exercise prescription to exercise independently           Exercise Goals Re-Evaluation:  Exercise Goals Re-Evaluation    Row Name 11/26/19 1428 12/24/19 1036 01/21/20 1245         Exercise Goal Re-Evaluation   Exercise Goals Review Increase Physical Activity;Increase Strength and Stamina;Able to understand and use rate of perceived exertion (RPE) scale;Knowledge and understanding of Target Heart Rate Range (THRR);Able to check pulse independently;Understanding of Exercise Prescription Increase Physical Activity;Increase Strength and Stamina;Able to understand and use rate of perceived exertion (RPE) scale;Knowledge and understanding of Target Heart Rate Range (THRR);Able to check pulse independently;Understanding of Exercise Prescription Increase Physical Activity;Increase Strength and Stamina;Able to understand and use rate of perceived exertion (RPE) scale;Knowledge and understanding of Target Heart Rate Range (THRR);Able to check pulse independently;Understanding of Exercise Prescription     Comments Pt has attended 5 exercise sessions. During his last session, he was found to be in newly onset atrial fibrillation. He has been advised to not return to rehab by his MD until his follow up visit on 8/4. Before this, he was progressing well and had a positive outlook. He currently exercises at 4.3 METs on the elliptical. Will continue to monitor and progress as able. Pt has completed 9  exercise sessions. He missed a few weeks due to a new diagnosis of Afib. He has continued to progress well even with his absences. He currently exercises at 4.7 METs on the elliptical. Will continue to monitor and progress as able. Pt has completed 19 exercise sessions. His attendance has been excellent since being placed on medications for his Afib. He continues to progress and is currently exercising at 5.2 METs on the stepper. Will continue to monitor and progress as able.     Expected Outcomes Pt will continue to progress towards his goals and begin a home exercise program. Pt will continue to progress towards his goals and begin a home exercise program. Through exercise at rehab and by engaging in a home exercise plan, the patient will be able to reach their goals.            Nutrition & Weight - Outcomes:  Pre Biometrics - 11/02/19 1452      Pre Biometrics   Height 6' 2"  (1.88 m)    Weight 90.9 kg    Waist Circumference 38 inches    Hip Circumference 40 inches    Waist to Hip Ratio 0.95 %    BMI (Calculated) 25.72    Triceps Skinfold 5 mm    % Body Fat 20.5 %    Grip Strength 39.6 kg    Flexibility 0 in    Single Leg Stand 2.53 seconds           Post Biometrics - 02/01/20 1041       Post  Biometrics   Height 6' (1.829 m)    Weight 90.6 kg    Waist Circumference 39 inches    Hip Circumference 40.5 inches    Waist to Hip Ratio 0.96 %  BMI (Calculated) 27.08    Triceps Skinfold 7 mm    % Body Fat 22.9 %    Grip Strength 40.7 kg    Flexibility 25.5 in    Single Leg Stand 3.4 seconds           Nutrition:  Nutrition Therapy & Goals - 02/01/20 1035      Personal Nutrition Goals   Comments Patient's exit medificts score increased but still therapeutic at 20. He says he has learned to eat healthier and pay attention to what he is eating.           Nutrition Discharge:  Nutrition Assessments - 02/01/20 1034      MEDFICTS Scores   Pre Score 6    Post Score 20     Score Difference 14           Education Questionnaire Score:  Knowledge Questionnaire Score - 01/31/20 0829      Knowledge Questionnaire Score   Post Score 27/28          Patient graduated from [place] today on 02/04/20 after completing 26  sessions. They achieved LTG of 30 minutes of aerobic exercise at Max Met level of 4.8.  All patients vitals are WNL. Discharge instruction has been reviewed in detail and patient stated an understanding of material given. Patient plans to exercise at a local gym. Cardiac Rehab staff will make f/u call. Patient had no complaints of any abnormal S/S or pain on their exit visit.   Goals reviewed with patient; copy given to patient.

## 2020-02-05 ENCOUNTER — Encounter: Payer: Self-pay | Admitting: *Deleted

## 2020-02-05 NOTE — Telephone Encounter (Signed)
Pt notified that letter is available for pick up in the office.

## 2020-02-20 ENCOUNTER — Ambulatory Visit (INDEPENDENT_AMBULATORY_CARE_PROVIDER_SITE_OTHER): Payer: BC Managed Care – PPO | Admitting: Family Medicine

## 2020-02-20 ENCOUNTER — Encounter: Payer: Self-pay | Admitting: Family Medicine

## 2020-02-20 ENCOUNTER — Other Ambulatory Visit: Payer: Self-pay

## 2020-02-20 ENCOUNTER — Other Ambulatory Visit (INDEPENDENT_AMBULATORY_CARE_PROVIDER_SITE_OTHER): Payer: Self-pay

## 2020-02-20 VITALS — BP 124/78 | HR 79 | Ht 74.0 in | Wt 199.0 lb

## 2020-02-20 DIAGNOSIS — Z23 Encounter for immunization: Secondary | ICD-10-CM

## 2020-02-20 DIAGNOSIS — M25552 Pain in left hip: Secondary | ICD-10-CM | POA: Diagnosis not present

## 2020-02-20 DIAGNOSIS — Z720 Tobacco use: Secondary | ICD-10-CM

## 2020-02-20 DIAGNOSIS — Z789 Other specified health status: Secondary | ICD-10-CM

## 2020-02-20 DIAGNOSIS — E559 Vitamin D deficiency, unspecified: Secondary | ICD-10-CM

## 2020-02-20 DIAGNOSIS — Z952 Presence of prosthetic heart valve: Secondary | ICD-10-CM

## 2020-02-20 DIAGNOSIS — Z7289 Other problems related to lifestyle: Secondary | ICD-10-CM

## 2020-02-20 DIAGNOSIS — E663 Overweight: Secondary | ICD-10-CM | POA: Diagnosis not present

## 2020-02-20 DIAGNOSIS — I10 Essential (primary) hypertension: Secondary | ICD-10-CM

## 2020-02-20 DIAGNOSIS — Z0001 Encounter for general adult medical examination with abnormal findings: Secondary | ICD-10-CM | POA: Diagnosis not present

## 2020-02-20 DIAGNOSIS — E785 Hyperlipidemia, unspecified: Secondary | ICD-10-CM

## 2020-02-20 DIAGNOSIS — Z125 Encounter for screening for malignant neoplasm of prostate: Secondary | ICD-10-CM

## 2020-02-20 MED ORDER — KETOROLAC TROMETHAMINE 60 MG/2ML IM SOLN
60.0000 mg | Freq: Once | INTRAMUSCULAR | Status: AC
  Administered 2020-02-20: 60 mg via INTRAMUSCULAR

## 2020-02-20 MED ORDER — METHYLPREDNISOLONE ACETATE 80 MG/ML IJ SUSP
80.0000 mg | Freq: Once | INTRAMUSCULAR | Status: AC
  Administered 2020-02-20: 80 mg via INTRAMUSCULAR

## 2020-02-20 MED ORDER — PREDNISONE 10 MG (21) PO TBPK: ORAL_TABLET | ORAL | 0 refills | Status: DC

## 2020-02-20 NOTE — Assessment & Plan Note (Signed)
Injections today for pain, pred dose pk, if not improved referral will be made

## 2020-02-20 NOTE — Assessment & Plan Note (Signed)

## 2020-02-20 NOTE — Assessment & Plan Note (Signed)
Unchanged  Donald [name] is educated about the importance of exercise daily to help with weight management. A minumum of 30 minutes daily is recommended. Additionally, importance of healthy food choices  with portion control discussed.   Wt Readings from Last 3 Encounters:  02/20/20 199 lb (90.3 kg)  02/01/20 199 lb 11.8 oz (90.6 kg)  01/30/20 199 lb 15.3 oz (90.7 kg)

## 2020-02-20 NOTE — Patient Instructions (Signed)
  HAPPY FALL!  I appreciate the opportunity to provide you with care for your health and wellness. Today we discussed: overall health   Follow up: 6 months   Labs -fasting No referrals today  Injections for hip pain and pred dose pk  Call if not better.  Have a great Holiday Season :)  Please continue to practice social distancing to keep you, your family, and our community safe.  If you must go out, please wear a mask and practice good handwashing.  It was a pleasure to see you and I look forward to continuing to work together on your health and well-being. Please do not hesitate to call the office if you need care or have questions about your care.  Have a wonderful day and week. With Gratitude, Cherly Beach, DNP, AGNP-BC  HEALTH MAINTENANCE RECOMMENDATIONS:  It is recommended that you get at least 30 minutes of aerobic exercise at least 5 days/week (for weight loss, you may need as much as 60-90 minutes). This can be any activity that gets your heart rate up. This can be divided in 10-15 minute intervals if needed, but try and build up your endurance at least once a week.  Weight bearing exercise is also recommended twice weekly.  Eat a healthy diet with lots of vegetables, fruits and fiber.  "Colorful" foods have a lot of vitamins (ie green vegetables, tomatoes, red peppers, etc).  Limit sweet tea, regular sodas and alcoholic beverages, all of which has a lot of calories and sugar.  Up to 2 alcoholic drinks daily may be beneficial for men (unless trying to lose weight, watch sugars).  Drink a lot of water.  Sunscreen of at least SPF 30 should be used on all sun-exposed parts of the skin when outside between the hours of 10 am and 4 pm (not just when at beach or pool, but even with exercise, golf, tennis, and yard work!)  Use a sunscreen that says "broad spectrum" so it covers both UVA and UVB rays, and make sure to reapply every 1-2 hours.  Remember to change the batteries in  your smoke detectors when changing your clock times in the spring and fall.  Use your seat belt every time you are in a car, and please drive safely and not be distracted with cell phones and texting while driving.

## 2020-02-20 NOTE — Assessment & Plan Note (Signed)
Doing well, is followed closely by Cards.

## 2020-02-20 NOTE — Progress Notes (Signed)
Health Maintenance reviewed -  Immunization History  Administered Date(s) Administered  . Influenza,inj,Quad PF,6+ Mos 02/20/2020  . Influenza-Unspecified 03/12/2019  . Moderna SARS-COVID-2 Vaccination 08/01/2019, 08/29/2019  . Tdap 06/10/2011   Last colonoscopy: check on referral  Last PSA: Ordered today  Dentist: Cleaning was 3 weeks back  Ophtho: No in a while, does have readers and night time glasses Exercise: daily- 13,000 steps  Smoker: smoking 5 cigarettes daily  Alcohol Use: 2 glasses whiskey 4-5 days of week  Other doctors caring for patient include:  Patient Care Team: Perlie Mayo, NP as PCP - General (Family Medicine) Satira Sark, MD as PCP - Cardiology (Cardiology)  End of Life Discussion:  Patient does not have a living will and medical power of attorney  Subjective:   HPI  Donald [name] is a 54 y.o. male who presents for annual visit and follow-up on chronic medical conditions.  He has the following concerns: Left hip pain that has been going on for over a week. Denies injury. It is sharp in the front aspect of the pelvic joint.  It worsens after sitting for long periods of time and trying to get up.Marland Kitchen He reports no relief from OTC-Tylenol and heat and rest. Limited OTC use due to AVR. Willing to have pain injections today and referral if no improvement. History of joint trouble.   Reports sleeping well. Denies trouble chewing or swallowing. Denies bladder or bowel habits. No blood urine or stool. Denies memory changes. Denies falls or injuries. Denies skin or hearing. Some night vision trouble.  Flu shot today.  Fasting labs today.  Review Of Systems  Review of Systems  Constitutional: Negative.   HENT: Negative.   Eyes: Negative.   Respiratory: Negative.   Cardiovascular: Negative.   Gastrointestinal: Negative.   Endocrine: Negative.   Genitourinary: Negative.   Musculoskeletal: Negative.   Skin: Negative.   Neurological: Negative.    Psychiatric/Behavioral: Negative.     Objective:   PHYSICAL EXAM:  BP 124/78 (BP Location: Left Arm, Patient Position: Sitting, Cuff Size: Normal)   Pulse 79   Ht 6\' 2"  (1.88 m)   Wt 199 lb (90.3 kg)   SpO2 97%   BMI 25.55 kg/m   Physical Exam Vitals and nursing note reviewed.  Constitutional:      Appearance: Normal appearance. He is well-developed, well-groomed and overweight.  HENT:     Head: Normocephalic and atraumatic.     Right Ear: Tympanic membrane, ear canal and external ear normal.     Left Ear: Tympanic membrane, ear canal and external ear normal.     Nose: Nose normal.     Mouth/Throat:     Mouth: Mucous membranes are moist.     Pharynx: Oropharynx is clear.  Eyes:     General:        Right eye: No discharge.        Left eye: No discharge.     Extraocular Movements: Extraocular movements intact.     Conjunctiva/sclera: Conjunctivae normal.     Pupils: Pupils are equal, round, and reactive to light.  Cardiovascular:     Rate and Rhythm: Normal rate and regular rhythm.     Pulses: Normal pulses.     Heart sounds: Normal heart sounds.  Pulmonary:     Effort: Pulmonary effort is normal.     Breath sounds: Normal breath sounds.  Abdominal:     General: Bowel sounds are normal. There is no distension.  Palpations: Abdomen is soft.  Musculoskeletal:        General: Normal range of motion.     Cervical back: Normal range of motion and neck supple.     Right lower leg: No edema.     Left lower leg: No edema.  Skin:    General: Skin is warm and dry.     Capillary Refill: Capillary refill takes less than 2 seconds.  Neurological:     General: No focal deficit present.     Mental Status: He is alert and oriented to person, place, and time.     Cranial Nerves: Cranial nerves are intact.     Sensory: Sensation is intact.     Motor: Motor function is intact.     Coordination: Coordination is intact.     Gait: Gait is intact.     Deep Tendon Reflexes:  Reflexes are normal and symmetric.  Psychiatric:        Attention and Perception: Attention normal.        Mood and Affect: Mood and affect normal.        Speech: Speech normal.        Behavior: Behavior normal. Behavior is cooperative.        Thought Content: Thought content normal.        Cognition and Memory: Cognition and memory normal.        Judgment: Judgment normal.    Depression Screening  Depression screen [facility] 2/9 02/20/2020 02/20/2020 01/31/2020 11/02/2019 10/03/2019  Decreased Interest 0 0 0 0 0  Down, Depressed, Hopeless 0 0 0 0 0  PHQ - 2 Score 0 0 0 0 0  Altered sleeping 0 0 1 0 3  Tired, decreased energy 0 0 3 0 0  Change in appetite 0 0 0 0 0  Feeling bad or failure about yourself  0 0 0 0 0  Trouble concentrating 0 0 0 0 0  Moving slowly or fidgety/restless 0 0 0 0 0  Suicidal thoughts 0 0 0 0 0  PHQ-9 Score 0 0 4 0 3  Difficult doing work/chores Not difficult at all Not difficult at all Not difficult at all Not difficult at all Not difficult at all     Falls  Fall Risk  02/20/2020 11/02/2019 10/03/2019 06/27/2019  Falls in the past year? 1 0 0 0  Number falls in past yr: 0 - 0 0  Injury with Fall? 1 - 0 0  Risk for fall due to : Impaired balance/gait No Fall Risks No Fall Risks -  Follow up Falls evaluation completed Falls evaluation completed Falls evaluation completed -    Assessment & Plan:   1. Annual visit for general adult medical examination with abnormal findings   2. Hyperlipidemia, unspecified hyperlipidemia type   3. Essential hypertension   4. Overweight (BMI 25.0-29.9)   5. Nicotine abuse   6. Alcohol use   7. Left hip pain   8. S/P AVR (aortic valve replacement)   9. Encounter for screening for malignant neoplasm of prostate   10. Vitamin D deficiency   11. Need for immunization against influenza     Tests ordered Orders Placed This Encounter  Procedures  . Flu Vaccine QUAD 36+ mos IM  . CBC  . Comprehensive metabolic panel  . Lipid  panel  . Hemoglobin A1c  . TSH  . VITAMIN D 25 Hydroxy (Vit-D Deficiency, Fractures)  . PSA     Plan: Please see assessment and plan  per problem list above.   Meds ordered this encounter  Medications  . ketorolac (TORADOL) injection 60 mg  . methylPREDNISolone acetate (DEPO-MEDROL) injection 80 mg  . predniSONE (STERAPRED UNI-PAK 21 TAB) 10 MG (21) TBPK tablet    Sig: Take as directed    Dispense:  21 tablet    Refill:  0    Order Specific Question:   Supervising Provider    Answer:   Fayrene Helper [[id number]]    I have personally reviewed: The patient's medical and social history Their use of alcohol, tobacco or illicit drugs Their current medications and supplements The patient's functional ability including ADLs,fall risks, home safety risks, cognitive, and hearing and visual impairment Diet and physical activities Evidence for depression or mood disorders  The patient's weight, height, BMI, and visual acuity have been recorded in the chart.  I have made referrals, counseling, and provided education to the patient based on review of the above and I have provided the patient with a written personalized care plan for preventive services.    Note: This dictation was prepared with Dragon dictation along with smaller phrase technology. Similar sounding words can be transcribed inadequately or may not be corrected upon review. Any transcriptional errors that result from this process are unintentional.      Perlie Mayo, NP   02/20/2020

## 2020-02-20 NOTE — Assessment & Plan Note (Signed)
Discussed PSA screening (risks/benefits), recommended at least 30 minutes of aerobic activity at least 5 days/week; proper sunscreen use reviewed; healthy diet and alcohol recommendations (less than or equal to 2 drinks/day) reviewed; regular seatbelt use; changing batteries in smoke detectors. Immunization recommendations discussed.  Colonoscopy recommendations reviewed. ° °

## 2020-02-20 NOTE — Assessment & Plan Note (Signed)
Heart healthy diet is encouraged.  Reduction in alcohol is encouraged.  We will get updated labs and adjust medications as needed.

## 2020-02-20 NOTE — Assessment & Plan Note (Signed)
Controlled, continue current medications. Followed by Cards as well. DASH diet, exercise, reduction in smoking and alcohol are encouraged.

## 2020-02-20 NOTE — Assessment & Plan Note (Signed)
Continues to drink 1-2 drinks a night of whiskey. I have encouraged him to reduce and avoid if able.

## 2020-02-20 NOTE — Assessment & Plan Note (Signed)
Asked about quitting: confirms they are currently smokes 5 cigarettes daily Advise to quit smoking: Educated about QUITTING to reduce the risk of cancer, cardio and cerebrovascular disease. Assess willingness: Unwilling to quit at this time, but is working on cutting back. Assist with counseling and pharmacotherapy: Counseled for 5 minutes and literature provided. Arrange for follow up not quitting follow up in 3 months and continue to offer help.

## 2020-03-13 ENCOUNTER — Ambulatory Visit: Payer: BC Managed Care – PPO | Admitting: Student

## 2020-03-16 ENCOUNTER — Encounter: Payer: Self-pay | Admitting: Emergency Medicine

## 2020-03-16 ENCOUNTER — Ambulatory Visit
Admission: EM | Admit: 2020-03-16 | Discharge: 2020-03-16 | Disposition: A | Discharge status: Home / Self Care | Payer: BC Managed Care – PPO | Attending: Emergency Medicine | Admitting: Emergency Medicine

## 2020-03-16 DIAGNOSIS — T300 Burn of unspecified body region, unspecified degree: Secondary | ICD-10-CM | POA: Diagnosis not present

## 2020-03-16 MED ORDER — CEPHALEXIN 500 MG PO CAPS: 500.0000 mg | ORAL_CAPSULE | Freq: Four times a day (QID) | ORAL | 0 refills | Status: DC

## 2020-03-16 MED ORDER — SILVER SULFADIAZINE 1 % EX CREA: 1.0000 "application " | TOPICAL_CREAM | Freq: Every day | CUTANEOUS | 0 refills | Status: DC

## 2020-03-16 NOTE — Discharge Instructions (Addendum)
Silvadene was prescribed/take as directed Keflex will be prescribed/take as directed Follow with PCP Return or go to ED if you develop any new or worsening of symptoms
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
## 2020-03-21 ENCOUNTER — Other Ambulatory Visit: Payer: Self-pay | Admitting: Family Medicine

## 2020-03-21 DIAGNOSIS — M109 Gout, unspecified: Secondary | ICD-10-CM

## 2020-03-27 ENCOUNTER — Other Ambulatory Visit: Payer: Self-pay

## 2020-03-27 ENCOUNTER — Encounter: Payer: Self-pay | Admitting: Family Medicine

## 2020-03-27 ENCOUNTER — Ambulatory Visit (INDEPENDENT_AMBULATORY_CARE_PROVIDER_SITE_OTHER): Payer: BC Managed Care – PPO | Admitting: Family Medicine

## 2020-03-27 VITALS — BP 152/80 | HR 112 | Temp 98.7°F | Ht 74.0 in | Wt 199.0 lb

## 2020-03-27 DIAGNOSIS — R252 Cramp and spasm: Secondary | ICD-10-CM | POA: Diagnosis not present

## 2020-03-27 DIAGNOSIS — M25552 Pain in left hip: Secondary | ICD-10-CM | POA: Diagnosis not present

## 2020-03-27 MED ORDER — METHYLPREDNISOLONE ACETATE 80 MG/ML IJ SUSP
80.0000 mg | Freq: Once | INTRAMUSCULAR | Status: AC
  Administered 2020-03-27: 80 mg via INTRAMUSCULAR

## 2020-03-27 NOTE — Patient Instructions (Signed)
  I appreciate the opportunity to provide you with care for your health and wellness. Today we discussed: left hip pain  Follow up:  As scheduled or as needed  No labs  Referrals today: Ortho  Steroid injection for pain  Magnesium rub for muscle cramps  Please continue to practice social distancing to keep you, your family, and our community safe.  If you must go out, please wear a mask and practice good handwashing.  It was a pleasure to see you and I look forward to continuing to work together on your health and well-being. Please do not hesitate to call the office if you need care or have questions about your care.  Have a wonderful day. With Gratitude, Cherly Beach, DNP, AGNP-BC

## 2020-03-27 NOTE — Assessment & Plan Note (Signed)
Encouraged to try magnesium muscle rub. Not to take the oral unless he spoke with me again about this due to blood thinner and being a heart disease patient.  Patient acknowledged agreement and understanding of the plan.

## 2020-03-27 NOTE — Progress Notes (Signed)
Subjective:  Patient ID: Donald [name], male    DOB: 1965/10/20  Age: 54 y.o. MRN: [id number]  CC:  Chief Complaint  Patient presents with  . Hip Pain    ongoing L hip pain, x4 weeks, worsening over the past few days  . Knee Pain    L knee is now hurting x several days      HPI  HPI Donald [name] is a 54 year old male who presents for an acute visit for on going left hip pain.  Left hip pain that has been going on for over a month. He has his annual CPE with me in Oct and was treated with prednisone injection and dose pk, reports some relief, but it came back after awhile. Denies injury. It is sharp in the front aspect of the pelvic joint.  It worsens after sitting for long periods of time and trying to get up.Marland Kitchen He reports no relief from OTC-Tylenol and heat and rest. Limited OTC use due to AVR. History of joint trouble- Dr Aline Brochure has helped care for his shoulders.   Pain score today in office is rated 6/10- after sitting for a minute or so. At its worse the pain is a level 8-9/10.  It has started to disturb sleep -wakes him up a few times- gets cramps in the leg while sleeping -cramping is in the hamstring. Limiting movement and trouble with stairs.  He reports some isolated random muscle cramps at night in the same leg.   Today patient denies signs and symptoms of COVID 19 infection including fever, chills, cough, shortness of breath, and headache. Past Medical, Surgical, Social History, Allergies, and Medications have been Reviewed.   Past Medical History:  Diagnosis Date  . Abnormal EKG 06/27/2019  . Acute gout of left foot 10/03/2019  . Bone tumor 11/22/2013  . Cardiomyopathy ([place])    a. EF 30 to 35% by echo in 06/2019 in the setting of severe AI  . Encounter for screening for malignant neoplasm of colon 06/27/2019  . Glenoid labral tear 10/27/2010  . History of gout 06/27/2019  . Hyperlipidemia   . Nonrheumatic aortic valve insufficiency   . Patent foramen ovale   . Rotator  cuff tear, right 10/27/2010  . S/P aortic valve replacement with bioprosthetic valve 08/09/2019   29 mm Edwards Inspiris Resilia stented bovine pericardial tissue valve  . S/P patent foramen ovale closure 08/09/2019  . Severe aortic insufficiency   . Shoulder injury 10/27/2010    Current Meds  Medication Sig  . allopurinol (ZYLOPRIM) 100 MG tablet Take 1 tablet (100 mg total) by mouth daily.  Marland Kitchen apixaban (ELIQUIS) 5 MG TABS tablet Take 1 tablet (5 mg total) by mouth 2 (two) times daily.  Marland Kitchen atorvastatin (LIPITOR) 10 MG tablet Take 10 mg by mouth every evening.   . cephALEXin (KEFLEX) 500 MG capsule Take 1 capsule (500 mg total) by mouth 4 (four) times daily.  . Cholecalciferol (VITAMIN D3) 50 MCG (2000 UT) TABS Take 2,000 Units by mouth daily.  . Coenzyme Q10 (COQ10) 100 MG CAPS Take 100 mg by mouth daily.  . metoprolol succinate (TOPROL XL) 50 MG 24 hr tablet Take 1 tablet (50 mg total) by mouth daily. Take with or immediately following a meal.  . Multiple Vitamin (MULTIVITAMIN WITH MINERALS) TABS tablet Take 1 tablet by mouth daily.  . Omega-3 Fatty Acids (FISH OIL PO) Take 2,000 mg by mouth daily.   . Probiotic Product (ACIDOPHILUS SUPER  PROBIOTIC PO) Take 1 tablet by mouth daily.  . silver sulfADIAZINE (SILVADENE) 1 % cream Apply 1 application topically daily.  . [DISCONTINUED] predniSONE (STERAPRED UNI-PAK 21 TAB) 10 MG (21) TBPK tablet Take as directed    ROS:  Review of Systems  Constitutional: Negative.   HENT: Negative.   Eyes: Negative.   Respiratory: Negative.   Cardiovascular: Negative.   Gastrointestinal: Negative.   Genitourinary: Negative.   Musculoskeletal: Positive for joint pain.  Skin: Negative.   Neurological: Negative.   Endo/Heme/Allergies: Negative.   Psychiatric/Behavioral: Negative.      Objective:   Today's Vitals: BP (!) 152/80 (BP Location: Right Arm, Patient Position: Sitting, Cuff Size: Normal)   Pulse (!) 112   Temp 98.7 F (37.1 C) (Temporal)    Ht 6\' 2"  (1.88 m)   Wt 199 lb (90.3 kg)   SpO2 96%   BMI 25.55 kg/m  Vitals with BMI 03/27/2020 03/16/2020 02/20/2020  Height 6\' 2"  6\' 2"  6\' 2"   Weight 199 lbs 195 lbs 199 lbs  BMI 25.54 19.41 74.08  Systolic 144 818 563  Diastolic 80 76 78  Pulse 149 96 79     Physical Exam Vitals and nursing note reviewed.  Constitutional:      Appearance: Normal appearance. He is well-developed, well-groomed and overweight.  HENT:     Head: Normocephalic and atraumatic.     Right Ear: External ear normal.     Left Ear: External ear normal.     Mouth/Throat:     Comments: Mask in place  Eyes:     General:        Right eye: No discharge.        Left eye: No discharge.     Conjunctiva/sclera: Conjunctivae normal.  Cardiovascular:     Rate and Rhythm: Normal rate and regular rhythm.     Pulses: Normal pulses.     Heart sounds: Normal heart sounds.  Pulmonary:     Effort: Pulmonary effort is normal.     Breath sounds: Normal breath sounds.  Musculoskeletal:     Cervical back: Normal range of motion and neck supple.     Comments: No tenderness to palpitation. Pain with ambulation in the outer hip and lateral knee   Skin:    General: Skin is warm.  Neurological:     General: No focal deficit present.     Mental Status: He is alert and oriented to person, place, and time.  Psychiatric:        Mood and Affect: Mood normal.        Behavior: Behavior normal.        Thought Content: Thought content normal.        Judgment: Judgment normal.     Assessment   1. Left hip pain   2. Muscle cramps at night     Tests ordered Orders Placed This Encounter  Procedures  . Ambulatory referral to Orthopedic Surgery     Plan: Please see assessment and plan per problem list above.   Meds ordered this encounter  Medications  . methylPREDNISolone acetate (DEPO-MEDROL) injection 80 mg    Patient to follow-up in 08/20/2020   Note: This dictation was prepared with Dragon dictation  along with smaller phrase technology. Similar sounding words can be transcribed inadequately or may not be corrected upon review. Any transcriptional errors that result from this process are unintentional.      Perlie Mayo, NP

## 2020-03-27 NOTE — Assessment & Plan Note (Signed)
Steroid injection today for pain. Referral to Ortho Do not want to mask symptoms so will avoid dose pk today and if appt with ortho is over a week out he is to call and we will do the dose pk if needed.

## 2020-04-14 ENCOUNTER — Ambulatory Visit: Payer: BC Managed Care – PPO | Admitting: Orthopedic Surgery

## 2020-04-14 ENCOUNTER — Telehealth: Payer: Self-pay | Admitting: Cardiology

## 2020-04-14 NOTE — Telephone Encounter (Signed)
New message    Patient changed insurance it no longer covers the Eliquis, he will be out of his medication at the end of the month ? is there something else he can take in place of it ?  Is there anything he can take for Covid Virus ?

## 2020-04-15 ENCOUNTER — Telehealth: Payer: Self-pay

## 2020-04-15 NOTE — Telephone Encounter (Signed)
    Can he check to see if his insurance covers Xarelto 20mg  daily? If not, the only other alternative would be Coumadin.   Does he mean as in prevention or active infection? If for prevention, it looks like he already received the Moderna vaccination and I would recommend getting a booster once able to do so. If active infection, I would encourage him to reach out to his PCP to discuss current symptoms as there are infusions some patients qualify for based off how long ago the diagnosis occurred and what symptoms he currently has.   Signed, Erma Heritage, PA-C 04/15/2020, 7:41 AM Pager: [phone]

## 2020-04-15 NOTE — Telephone Encounter (Signed)
Donald [name] will call his new insurance company and if they cover Xarelto, he will have his wife stop by for commercial $10 co-pay card.   He was exposed to a co-worker who is Covid positive and he home tested on Saturday and stated it was a faint positive line.His wife was negative. He has no symptoms.His supervisor had him go to a test site and he awaits results.   He had second Moderna shot in May and had not received his booster yet.

## 2020-04-15 NOTE — Telephone Encounter (Signed)
Wife said that Donald [name] was diagnosed with covid and had a bad dry cough and wanted to know if anything needed to be given or done. May need appt to discuss

## 2020-04-15 NOTE — Telephone Encounter (Signed)
Offered appt from [facility]- pt declined but just wanted to know if there is anything he needs to look out for with his medical conditions.

## 2020-04-16 ENCOUNTER — Ambulatory Visit: Payer: BC Managed Care – PPO | Admitting: Cardiology

## 2020-04-16 ENCOUNTER — Other Ambulatory Visit: Payer: Self-pay

## 2020-04-16 ENCOUNTER — Telehealth: Payer: Self-pay | Admitting: Family Medicine

## 2020-04-16 ENCOUNTER — Encounter: Payer: Self-pay | Admitting: Nurse Practitioner

## 2020-04-16 ENCOUNTER — Telehealth: Payer: Self-pay

## 2020-04-16 ENCOUNTER — Telehealth (INDEPENDENT_AMBULATORY_CARE_PROVIDER_SITE_OTHER): Payer: BC Managed Care – PPO | Admitting: Nurse Practitioner

## 2020-04-16 DIAGNOSIS — U071 COVID-19: Secondary | ICD-10-CM | POA: Insufficient documentation

## 2020-04-16 HISTORY — DX: COVID-19: U07.1

## 2020-04-16 MED ORDER — PREDNISONE 10 MG PO TABS: ORAL_TABLET | ORAL | 0 refills | Status: AC

## 2020-04-16 MED ORDER — NOREL AD 4-10-325 MG PO TABS: 1.0000 | ORAL_TABLET | ORAL | 1 refills | Status: DC | PRN

## 2020-04-16 MED ORDER — BENZONATATE 100 MG PO CAPS: 100.0000 mg | ORAL_CAPSULE | Freq: Two times a day (BID) | ORAL | 0 refills | Status: DC | PRN

## 2020-04-16 NOTE — Telephone Encounter (Signed)
Honestly, COVID + patients need to look for increased shortness of breath, cough-uncontrolled, or chest pain. If he develops those- he is to immediately go to ED. The rest is symptom management via OTC items. I recommend testing for the others in the household given close quarters.

## 2020-04-16 NOTE — Telephone Encounter (Signed)
Replied back to [place] on this as well.

## 2020-04-16 NOTE — Progress Notes (Signed)
Acute Office Visit  Subjective:    Patient ID: Donald [name], male    DOB: 1966-04-24, 54 y.o.   MRN: [id number]  Chief Complaint  Patient presents with  . Cough  . Covid Positive    X 5 days.     HPI Patient is in today for COVID-19. He was diagnosed 5 days ago.  He is experiencing a cough, and he states his only symptom is an occasional cough.  He had 2x moderna vaccines.  He states he still has some nasal congestion and left eat popping when she swallows.  Past Medical History:  Diagnosis Date  . Abnormal EKG 06/27/2019  . Acute gout of left foot 10/03/2019  . Bone tumor 11/22/2013  . Cardiomyopathy ([facility])    a. EF 30 to 35% by echo in 06/2019 in the setting of severe AI  . Encounter for screening for malignant neoplasm of colon 06/27/2019  . Glenoid labral tear 10/27/2010  . History of gout 06/27/2019  . Hyperlipidemia   . Nonrheumatic aortic valve insufficiency   . Patent foramen ovale   . Rotator cuff tear, right 10/27/2010  . S/P aortic valve replacement with bioprosthetic valve 08/09/2019   29 mm Edwards Inspiris [name] stented bovine pericardial tissue valve  . S/P patent foramen ovale closure 08/09/2019  . Severe aortic insufficiency   . Shoulder injury 10/27/2010    Past Surgical History:  Procedure Laterality Date  . AORTIC VALVE REPLACEMENT N/A 08/09/2019   Procedure: AORTIC VALVE REPLACEMENT (AVR) using Donald [name] 29 MM Aortic Valve.;  Surgeon: Purcell Nails, MD;  Location: [facility] OR;  Service: Open Heart Surgery;  Laterality: N/A;  . BACK SURGERY     multiple back surgery  . Benign tumor     Left shin  . BUBBLE STUDY  07/05/2019   Procedure: BUBBLE STUDY;  Surgeon: Jonelle Sidle, MD;  Location: [facility] ORS;  Service: Cardiovascular;;  . CARDIAC VALVE REPLACEMENT N/A    Phreesia 02/19/2020  . HERNIA REPAIR    . REPAIR OF PATENT FORAMEN OVALE N/A 08/09/2019   Procedure: Closure Of Patent Foramen Ovale;  Surgeon: Purcell Nails, MD;  Location: [facility] OR;   Service: Open Heart Surgery;  Laterality: N/A;  . RIGHT/LEFT HEART CATH AND CORONARY ANGIOGRAPHY N/A 07/26/2019   Procedure: RIGHT/LEFT HEART CATH AND CORONARY ANGIOGRAPHY;  Surgeon: Kathleene Hazel, MD;  Location: [facility] INVASIVE CV LAB;  Service: Cardiovascular;  Laterality: N/A;  . SHOULDER SURGERY Left   . SHOULDER SURGERY Right   . TEE WITHOUT CARDIOVERSION N/A 07/05/2019   Procedure: TRANSESOPHAGEAL ECHOCARDIOGRAM (TEE) WITH PROPOFOL;  Surgeon: Jonelle Sidle, MD;  Location: [facility] ORS;  Service: Cardiovascular;  Laterality: N/A;  . TEE WITHOUT CARDIOVERSION N/A 08/09/2019   Procedure: TRANSESOPHAGEAL ECHOCARDIOGRAM (TEE);  Surgeon: Purcell Nails, MD;  Location: [facility] OR;  Service: Open Heart Surgery;  Laterality: N/A;  . Thumb surgery Left x3  . VASECTOMY N/A    Phreesia 02/19/2020    Family History  Problem Relation Age of Onset  . Other Father        Kidney    Social History   Socioeconomic History  . Marital status: Married    Spouse name: Donald [name]   . Number of children: 4  . Years of education: Not on file  . Highest education level: Some college, no degree  Occupational History  . Occupation: Sport and exercise psychologist: [employer]  Tobacco Use  . Smoking status: Former Smoker  Packs/day: 0.25    Types: Cigarettes    Quit date: 08/02/2019    Years since quitting: 0.7  . Smokeless tobacco: Never Used  . Tobacco comment: Cutting down < 5 cig / day  Vaping Use  . Vaping Use: Never used  Substance and Sexual Activity  . Alcohol use: Yes    Alcohol/week: 12.0 standard drinks    Types: 12 Shots of liquor per week    Comment: on Friday and Saturday  . Drug use: No  . Sexual activity: Yes    Birth control/protection: Surgical  Other Topics Concern  . Not on file  Social History Narrative   Live [name] wife of 2 years, previously was widowed from first wife from cancer 01/2011      Four children      Enjoy: golf, target shooting       Diet:     Caffeine: 16-20 oz coffee    Water: 6-7 bottles       Wears seat belt    Smoke detectors at home    Does not use phone while driving    Social Determinants of Health   Financial Resource Strain: Low Risk   . Difficulty of Paying Living Expenses: Not hard at all  Food Insecurity: No Food Insecurity  . Worried About Charity fundraiser in the Last Year: Never true  . Ran Out of Food in the Last Year: Never true  Transportation Needs: No Transportation Needs  . Lack of Transportation (Medical): No  . Lack of Transportation (Non-Medical): No  Physical Activity: Sufficiently Active  . Days of Exercise per Week: 5 days  . Minutes of Exercise per Session: 50 min  Stress: No Stress Concern Present  . Feeling of Stress : Not at all  Social Connections: Moderately Isolated  . Frequency of Communication with Friends and Family: More than three times a week  . Frequency of Social Gatherings with Friends and Family: Once a week  . Attends Religious Services: Never  . Active Member of Clubs or Organizations: No  . Attends Archivist Meetings: Never  . Marital Status: Married  Human resources officer Violence: Not At Risk  . Fear of Current or Ex-Partner: No  . Emotionally Abused: No  . Physically Abused: No  . Sexually Abused: No    Outpatient Medications Prior to Visit  Medication Sig Dispense Refill  . allopurinol (ZYLOPRIM) 100 MG tablet Take 1 tablet (100 mg total) by mouth daily. 30 tablet 6  . apixaban (ELIQUIS) 5 MG TABS tablet Take 1 tablet (5 mg total) by mouth 2 (two) times daily. 60 tablet 6  . atorvastatin (LIPITOR) 10 MG tablet Take 10 mg by mouth every evening.     . cephALEXin (KEFLEX) 500 MG capsule Take 1 capsule (500 mg total) by mouth 4 (four) times daily. 20 capsule 0  . Cholecalciferol (VITAMIN D3) 50 MCG (2000 UT) TABS Take 2,000 Units by mouth daily.    . Coenzyme Q10 (COQ10) 100 MG CAPS Take 100 mg by mouth daily.    . metoprolol succinate (TOPROL XL) 50 MG 24  hr tablet Take 1 tablet (50 mg total) by mouth daily. Take with or immediately following a meal. 90 tablet 1  . Multiple Vitamin (MULTIVITAMIN WITH MINERALS) TABS tablet Take 1 tablet by mouth daily.    . Omega-3 Fatty Acids (FISH OIL PO) Take 2,000 mg by mouth daily.     . Probiotic Product (ACIDOPHILUS SUPER PROBIOTIC PO) Take 1 tablet  by mouth daily.    . silver sulfADIAZINE (SILVADENE) 1 % cream Apply 1 application topically daily. (Patient not taking: Reported on 04/16/2020) 50 g 0   No facility-administered medications prior to visit.    Allergies  Allergen Reactions  . Other   . Ultram [Tramadol Hcl] Itching    "Tunnel vision" warm feeling all over and wanted to jump out of his skin    Review of Systems  HENT: Positive for congestion. Negative for sinus pressure, sinus pain, sneezing and sore throat.   Respiratory: Positive for cough. Negative for chest tightness, shortness of breath and wheezing.        Objective:    Physical Exam  There were no vitals taken for this visit. Wt Readings from Last 3 Encounters:  03/27/20 199 lb (90.3 kg)  03/16/20 195 lb (88.5 kg)  02/20/20 199 lb (90.3 kg)    Health Maintenance Due  Topic Date Due  . Hepatitis C Screening  Never done  . COLONOSCOPY  Never done  . COVID-19 Vaccine (3 - Booster for Moderna series) 02/29/2020    There are no preventive care reminders to display for this patient.   No results found for: TSH Lab Results  Component Value Date   WBC 6.3 10/03/2019   HGB 14.8 10/03/2019   HCT 43.3 10/03/2019   MCV 91.5 10/03/2019   PLT 207 10/03/2019   Lab Results  Component Value Date   NA 139 10/03/2019   K 4.9 10/03/2019   CO2 26 10/03/2019   GLUCOSE 88 10/03/2019   BUN 14 10/03/2019   CREATININE 0.78 10/03/2019   BILITOT 0.6 10/03/2019   ALKPHOS 57 08/12/2019   AST 14 10/03/2019   ALT 17 10/03/2019   PROT 6.8 10/03/2019   ALBUMIN 2.8 (L) 08/12/2019   CALCIUM 9.5 10/03/2019   ANIONGAP 9 08/13/2019    Lab Results  Component Value Date   CHOL 174 06/28/2019   Lab Results  Component Value Date   HDL 53 06/28/2019   Lab Results  Component Value Date   LDLCALC 76 06/28/2019   Lab Results  Component Value Date   TRIG 375 (H) 06/28/2019   Lab Results  Component Value Date   CHOLHDL 3.3 06/28/2019   Lab Results  Component Value Date   HGBA1C 5.4 08/07/2019       Assessment & Plan:   Problem List Items Addressed This Visit      Other   COVID-19    -has mild symptoms of nasal congestion and cough x 5 days -took a home test 5 days ago and that was positive; had work exposure -Rx. Prednisone -Rx. norel -Rx. Tessalon perles -will set up with [facility] for consideration for MAB given that he has cardiac history          Meds ordered this encounter  Medications  . benzonatate (TESSALON) 100 MG capsule    Sig: Take 1 capsule (100 mg total) by mouth 2 (two) times daily as needed for cough.    Dispense:  20 capsule    Refill:  0  . Chlorphen-PE-Acetaminophen (NOREL AD) 4-10-325 MG TABS    Sig: Take 1 tablet by mouth every 4 (four) hours as needed (nasal congestion, cold symptoms).    Dispense:  20 tablet    Refill:  1  . predniSONE (DELTASONE) 10 MG tablet    Sig: Take 6 tablets (60 mg total) by mouth daily with breakfast for 1 day, THEN 5 tablets (50 mg total)  daily with breakfast for 1 day, THEN 4 tablets (40 mg total) daily with breakfast for 1 day, THEN 3 tablets (30 mg total) daily with breakfast for 1 day, THEN 2 tablets (20 mg total) daily with breakfast for 1 day, THEN 1 tablet (10 mg total) daily with breakfast for 1 day.    Dispense:  21 tablet    Refill:  0   Date:  04/16/2020   Location of Patient: Home Location of Provider: Office Consent was obtain for visit to be over via telehealth. I verified that I am speaking with the correct person using two identifiers.  I connected with  Justine Null on 04/16/20 via telephone and verified that I am  speaking with the correct person using two identifiers.   I discussed the limitations of evaluation and management by telemedicine. The patient expressed understanding and agreed to proceed.  Time spent: 12 minutes   Heather Roberts, NP

## 2020-04-16 NOTE — Telephone Encounter (Signed)
Please let him know the our policy on medications being prescribed, he will need an appt.

## 2020-04-16 NOTE — Telephone Encounter (Signed)
Pt informed. Appt made

## 2020-04-16 NOTE — Telephone Encounter (Signed)
Patient calling back today he is wanting to know if we could give any advice for his symptoms  he tested positive for covid over the weekend and stated he is a heart surgery patient so he's worried I offered a virtual visit and he declined  Pt# [phone]

## 2020-04-16 NOTE — Telephone Encounter (Signed)
Informed pt. He wanted to know if you could call him in some Prednisone? Please advise.

## 2020-04-16 NOTE — Telephone Encounter (Signed)
Error

## 2020-04-16 NOTE — Assessment & Plan Note (Signed)
-  has mild symptoms of nasal congestion and cough x 5 days -took a home test 5 days ago and that was positive; had work exposure -Rx. Prednisone -Rx. norel -Rx. Tessalon perles -will set up with [place] [facility] for consideration for MAB given that he has cardiac history

## 2020-04-17 ENCOUNTER — Other Ambulatory Visit: Payer: Self-pay

## 2020-04-17 ENCOUNTER — Other Ambulatory Visit: Payer: BC Managed Care – PPO

## 2020-04-17 DIAGNOSIS — Z20822 Contact with and (suspected) exposure to covid-19: Secondary | ICD-10-CM | POA: Diagnosis not present

## 2020-04-19 LAB — SARS-COV-2, NAA 2 DAY TAT

## 2020-04-19 LAB — SPECIMEN STATUS REPORT

## 2020-04-19 LAB — NOVEL CORONAVIRUS, NAA: SARS-CoV-2, NAA: DETECTED — AB

## 2020-04-21 ENCOUNTER — Ambulatory Visit: Payer: BC Managed Care – PPO | Admitting: Orthopedic Surgery

## 2020-05-01 ENCOUNTER — Other Ambulatory Visit: Payer: Self-pay

## 2020-05-01 ENCOUNTER — Ambulatory Visit: Payer: BC Managed Care – PPO

## 2020-05-01 ENCOUNTER — Ambulatory Visit (INDEPENDENT_AMBULATORY_CARE_PROVIDER_SITE_OTHER): Payer: BC Managed Care – PPO | Admitting: Orthopedic Surgery

## 2020-05-01 VITALS — BP 139/97 | HR 96 | Wt 201.0 lb

## 2020-05-01 DIAGNOSIS — M25552 Pain in left hip: Secondary | ICD-10-CM | POA: Diagnosis not present

## 2020-05-01 NOTE — Patient Instructions (Signed)
ORDER MRI LEFT HIP

## 2020-05-01 NOTE — Progress Notes (Signed)
NEW PROBLEM//OFFICE VISIT  Summary assessment and plan:  Possible labral tear in his left hip.  Complicated by the fact patient is on Eliquis for a recent aortic valve.  The valve is a bovine valve so there is no contraindication MRI  Recommend MRI without the contrast so that we do not have to take him off of Eliquis  Chief Complaint  Patient presents with  . Hip Pain    Lt hip after standing still for any period of time.    55 year old male multiple surgeries recent aortic valve replacement bovine on Eliquis presents with left hip pain which started about 2 to 3 months ago.  He complains of pain when he is standing after sitting for long time pain in his riding in his car and has to get up.  The pain is over the left side of his hip and radiates into his groin   Review of Systems  Constitutional: Negative for fever.  Respiratory: Negative for shortness of breath.   Cardiovascular: Negative for chest pain.     Past Medical History:  Diagnosis Date  . Abnormal EKG 06/27/2019  . Acute gout of left foot 10/03/2019  . Bone tumor 11/22/2013  . Cardiomyopathy ([place])    a. EF 30 to 35% by echo in 06/2019 in the setting of severe AI  . Encounter for screening for malignant neoplasm of colon 06/27/2019  . Glenoid labral tear 10/27/2010  . History of gout 06/27/2019  . Hyperlipidemia   . Nonrheumatic aortic valve insufficiency   . Patent foramen ovale   . Rotator cuff tear, right 10/27/2010  . S/P aortic valve replacement with bioprosthetic valve 08/09/2019   29 mm Edwards Inspiris Resilia stented bovine pericardial tissue valve  . S/P patent foramen ovale closure 08/09/2019  . Severe aortic insufficiency   . Shoulder injury 10/27/2010    Past Surgical History:  Procedure Laterality Date  . AORTIC VALVE REPLACEMENT N/A 08/09/2019   Procedure: AORTIC VALVE REPLACEMENT (AVR) using Margaretha Sheffield Resilia 29 MM Aortic Valve.;  Surgeon: Rexene Alberts, MD;  Location: [facility];  Service: Open Heart  Surgery;  Laterality: N/A;  . BACK SURGERY     multiple back surgery  . Benign tumor     Left shin  . BUBBLE STUDY  07/05/2019   Procedure: BUBBLE STUDY;  Surgeon: Satira Sark, MD;  Location: [facility];  Service: Cardiovascular;;  . CARDIAC VALVE REPLACEMENT N/A    Phreesia 02/19/2020  . HERNIA REPAIR    . REPAIR OF PATENT FORAMEN OVALE N/A 08/09/2019   Procedure: Closure Of Patent Foramen Ovale;  Surgeon: Rexene Alberts, MD;  Location: [facility];  Service: Open Heart Surgery;  Laterality: N/A;  . RIGHT/LEFT HEART CATH AND CORONARY ANGIOGRAPHY N/A 07/26/2019   Procedure: RIGHT/LEFT HEART CATH AND CORONARY ANGIOGRAPHY;  Surgeon: Burnell Blanks, MD;  Location: [facility];  Service: Cardiovascular;  Laterality: N/A;  . SHOULDER SURGERY Left   . SHOULDER SURGERY Right   . TEE WITHOUT CARDIOVERSION N/A 07/05/2019   Procedure: TRANSESOPHAGEAL ECHOCARDIOGRAM (TEE) WITH PROPOFOL;  Surgeon: Satira Sark, MD;  Location: [facility];  Service: Cardiovascular;  Laterality: N/A;  . TEE WITHOUT CARDIOVERSION N/A 08/09/2019   Procedure: TRANSESOPHAGEAL ECHOCARDIOGRAM (TEE);  Surgeon: Rexene Alberts, MD;  Location: [facility];  Service: Open Heart Surgery;  Laterality: N/A;  . Thumb surgery Left x3  . VASECTOMY N/A    Phreesia 02/19/2020    Family History  Problem Relation Age of  Onset  . Other Father        Kidney   Social History   Tobacco Use  . Smoking status: Former Smoker    Packs/day: 0.25    Types: Cigarettes    Quit date: 08/02/2019    Years since quitting: 0.7  . Smokeless tobacco: Never Used  . Tobacco comment: Cutting down < 5 cig / day  Vaping Use  . Vaping Use: Never used  Substance Use Topics  . Alcohol use: Yes    Alcohol/week: 12.0 standard drinks    Types: 12 Shots of liquor per week    Comment: on Friday and Saturday  . Drug use: No    Allergies  Allergen Reactions  . Other   . Ultram [Tramadol Hcl] Itching    "Tunnel vision" warm feeling all over and  wanted to jump out of his skin    Current Meds  Medication Sig  . allopurinol (ZYLOPRIM) 100 MG tablet Take 1 tablet (100 mg total) by mouth daily.  Marland Kitchen apixaban (ELIQUIS) 5 MG TABS tablet Take 1 tablet (5 mg total) by mouth 2 (two) times daily.  Marland Kitchen atorvastatin (LIPITOR) 10 MG tablet Take 10 mg by mouth every evening.   . Cholecalciferol (VITAMIN D3) 50 MCG (2000 UT) TABS Take 2,000 Units by mouth daily.  . Coenzyme Q10 (COQ10) 100 MG CAPS Take 100 mg by mouth daily.  . metoprolol succinate (TOPROL XL) 50 MG 24 hr tablet Take 1 tablet (50 mg total) by mouth daily. Take with or immediately following a meal.  . Multiple Vitamin (MULTIVITAMIN WITH MINERALS) TABS tablet Take 1 tablet by mouth daily.  . Omega-3 Fatty Acids (FISH OIL PO) Take 2,000 mg by mouth daily.   . Probiotic Product (ACIDOPHILUS SUPER PROBIOTIC PO) Take 1 tablet by mouth daily.    BP (!) 139/97   Pulse 96   Wt 201 lb (91.2 kg)   BMI 25.81 kg/m   Physical Exam Constitutional:      General: He is not in acute distress.    Appearance: He is well-developed.  Cardiovascular:     Comments: No peripheral edema Skin:    General: Skin is warm and dry.  Neurological:     Mental Status: He is alert and oriented to person, place, and time.     Sensory: No sensory deficit.     Coordination: Coordination normal.     Gait: Gait abnormal.     Deep Tendon Reflexes: Reflexes are normal and symmetric.  Psychiatric:        Mood and Affect: Mood normal.        Behavior: Behavior normal.        Thought Content: Thought content normal.        Judgment: Judgment normal.     Ortho Exam  Pain with hip flexion and internal rotation  Leg lengths are equal and tender in the front of the hip and lateral portion of the hip and iliac crest is nontender over his lower back leg lengths are equal strength is normal MEDICAL DECISION MAKING  A.  Encounter Diagnosis  Name Primary?  . Pain in left hip Yes    B. DATA  ANALYSED:   IMAGING: Interpretation of images: Internal images pelvis and left hip were normal Orders: MRI left hip without contrast secondary to the Eliquis/less than 1 year since surgery on his heart unlikely to be able to stop Eliquis  Outside records reviewed:    C. MANAGEMENT   MRI LEFT  HIP LABRAL TEAR   No orders of the defined types were placed in this encounter.     Fuller Canada, MD  05/01/2020 11:13 AM

## 2020-05-20 NOTE — Progress Notes (Signed)
Cardiology Office Note  Date: 05/21/2020   ID: Donald [name], DOB [birth year], MRN [id number]  PCP:  Perlie Mayo, NP  Cardiologist:  Rozann Lesches, MD Electrophysiologist:  None   Chief Complaint  Patient presents with  . Cardiac follow-up    History of Present Illness: Donald [name] is a 55 y.o. male last seen in August 2021 by Ms. Strader PA-C.  He presents for a routine visit.  States that he has been doing well, NYHA class I-II dyspnea.  He walks for exercise, gets 10,000 steps daily.  He did test positive for COVID-19 back in late December 2021.  He had no significant symptoms per discussion today.  Had been around a sick contact at work and got tested.  He is due for follow-up lab work on [employer].  He does not report any bleeding problems.  Insurance no longer covering Eliquis and we will likely switch to Xarelto next.  I reviewed the remainder of his medications which are outlined below.  Toprol-XL should be 50 mg daily.  His last echocardiogram was in June 2021 at which point LVEF was 45 to 50% and he is bioprosthetic AVR was functioning normally with no significant aortic regurgitation.  He tells me that he has started smoking again, alcohol intake has picked up somewhat as well.  We discussed smoking cessation and also cutting back on his alcohol intake.  Past Medical History:  Diagnosis Date  . Acute gout of left foot 10/03/2019  . Bone tumor 11/22/2013  . Cardiomyopathy ([place])    a. EF 30 to 35% by echo in 06/2019 in the setting of severe AI  . Encounter for screening for malignant neoplasm of colon 06/27/2019  . Glenoid labral tear 10/27/2010  . History of gout 06/27/2019  . Hyperlipidemia   . Patent foramen ovale   . Rotator cuff tear, right 10/27/2010  . S/P aortic valve replacement with bioprosthetic valve 08/09/2019   29 mm Edwards Inspiris Resilia stented bovine pericardial tissue valve  . S/P patent foramen ovale closure 08/09/2019  . Severe aortic insufficiency      Past Surgical History:  Procedure Laterality Date  . AORTIC VALVE REPLACEMENT N/A 08/09/2019   Procedure: AORTIC VALVE REPLACEMENT (AVR) using Margaretha Sheffield Resilia 29 MM Aortic Valve.;  Surgeon: Rexene Alberts, MD;  Location: [facility];  Service: Open Heart Surgery;  Laterality: N/A;  . BACK SURGERY     multiple back surgery  . Benign tumor     Left shin  . BUBBLE STUDY  07/05/2019   Procedure: BUBBLE STUDY;  Surgeon: Satira Sark, MD;  [facility];  Service: Cardiovascular;;  . CARDIAC VALVE REPLACEMENT N/A    Phreesia 02/19/2020  . HERNIA REPAIR    . REPAIR OF PATENT FORAMEN OVALE N/A 08/09/2019   Procedure: Closure Of Patent Foramen Ovale;  Surgeon: Rexene Alberts, MD;  Location: [facility];  Service: Open Heart Surgery;  Laterality: N/A;  . RIGHT/LEFT HEART CATH AND CORONARY ANGIOGRAPHY N/A 07/26/2019   Procedure: RIGHT/LEFT HEART CATH AND CORONARY ANGIOGRAPHY;  Surgeon: Burnell Blanks, MD;  Location: [facility];  Service: Cardiovascular;  Laterality: N/A;  . SHOULDER SURGERY Left   . SHOULDER SURGERY Right   . TEE WITHOUT CARDIOVERSION N/A 07/05/2019   Procedure: TRANSESOPHAGEAL ECHOCARDIOGRAM (TEE) WITH PROPOFOL;  Surgeon: Satira Sark, MD;  [facility];  Service: Cardiovascular;  Laterality: N/A;  . TEE WITHOUT CARDIOVERSION N/A 08/09/2019   Procedure: TRANSESOPHAGEAL ECHOCARDIOGRAM (  TEE);  Surgeon: Rexene Alberts, MD;  Location: [facility];  Service: Open Heart Surgery;  Laterality: N/A;  . Thumb surgery Left x3  . VASECTOMY N/A    Phreesia 02/19/2020    Current Outpatient Medications  Medication Sig Dispense Refill  . allopurinol (ZYLOPRIM) 100 MG tablet TAKE 1 TABLET BY MOUTH EVERY DAY 90 tablet 2  . atorvastatin (LIPITOR) 10 MG tablet Take 10 mg by mouth every evening.     . Cholecalciferol (VITAMIN D3) 50 MCG (2000 UT) TABS Take 2,000 Units by mouth daily.    . Coenzyme Q10 (COQ10) 100 MG CAPS Take 100 mg by mouth daily.    . metoprolol  succinate (TOPROL-XL) 50 MG 24 hr tablet Take 1 tablet (50 mg total) by mouth daily. Take with or immediately following a meal. 90 tablet 3  . Multiple Vitamin (MULTIVITAMIN WITH MINERALS) TABS tablet Take 1 tablet by mouth daily.    . Omega-3 Fatty Acids (FISH OIL PO) Take 2,000 mg by mouth daily.     . Probiotic Product (ACIDOPHILUS SUPER PROBIOTIC PO) Take 1 tablet by mouth daily.    . rivaroxaban (XARELTO) 20 MG TABS tablet Take 1 tablet (20 mg total) by mouth daily with supper. 30 tablet 11   No current facility-administered medications for this visit.   Allergies:  Other and Ultram [tramadol hcl]   ROS: No palpitations or syncope.  Physical Exam: VS:  BP (!) 142/98   Pulse 68   Ht 6\' 2"  (1.88 m)   Wt 207 lb (93.9 kg)   BMI 26.58 kg/m , BMI Body mass index is 26.58 kg/m.  Wt Readings from Last 3 Encounters:  05/21/20 207 lb (93.9 kg)  05/01/20 201 lb (91.2 kg)  03/27/20 199 lb (90.3 kg)    General: Patient appears comfortable at rest. HEENT: Conjunctiva and lids normal, wearing a mask. Neck: Supple, no elevated JVP or carotid bruits, no thyromegaly. Lungs: Clear to auscultation, nonlabored breathing at rest. Cardiac: Regular rate and rhythm, no S3 or significant systolic murmur, no pericardial rub. Abdomen: Soft, nontender, bowel sounds present. Extremities: No pitting edema.  ECG:  An ECG dated 11/28/2019 was personally reviewed today and demonstrated:  Sinus rhythm with LVH, right bundle branch block, repolarization abnormalities, and PVC.  Recent Labwork: 08/10/2019: Magnesium 2.2 10/03/2019: ALT 17; AST 14; BUN 14; Creat 0.78; Hemoglobin 14.8; Platelets 207; Potassium 4.9; Sodium 139     Component Value Date/Time   CHOL 174 06/28/2019 0833   TRIG 375 (H) 06/28/2019 0833   HDL 53 06/28/2019 0833   CHOLHDL 3.3 06/28/2019 0833   LDLCALC 76 06/28/2019 0833    Other Studies Reviewed Today:  Echocardiogram 09/25/2019: 1. Right ventricular systolic function is normal.  The right ventricular  size is normal.  2. Anteroseptal hypokinesis consistent with prior cardiac surgery. . Left  ventricular ejection fraction, by estimation, is 45 to 50%. The left  ventricle has mildly decreased function. The left ventricle demonstrates  regional wall motion abnormalities  (see scoring diagram/findings for description). There is moderate left  ventricular hypertrophy. Left ventricular diastolic parameters are  consistent with Grade I diastolic dysfunction (impaired relaxation).  3. The mitral valve is normal in structure. No evidence of mitral valve  regurgitation. No evidence of mitral stenosis.  4. Edwards Inspiris Resilia stented bovine pericardial tissue valve size  29 mm in AV position. Normal function. . The aortic valve has been  repaired/replaced. Aortic valve regurgitation is not visualized. No aortic  stenosis is present.  5. The inferior vena cava is normal in size with greater than 50%  respiratory variability, suggesting right atrial pressure of 3 mmHg.   Assessment and Plan:  1.  History of severe aortic regurgitation status post 29 mm Edwards Inspiris Resilia stented bovine pericardial tissue valve replacement in April 2021.  He is doing well at this time, follow-up echocardiogram in June 2021 revealed stable bioprosthetic function with no residual aortic regurgitation.  2.  History of nonischemic cardiomyopathy, LVEF improved to the range of 45 to 50% by last echocardiogram in June 2021.  Continue Toprol-XL although at 50 mg daily as before.  Follow-up echocardiogram just prior to next visit.  3.  Essential hypertension, blood pressure is up today.  Beta-blocker dose being increased.  We also discussed cutting back on alcohol intake.  4.  Paroxysmal atrial fibrillation with CHA2DS2-VASc score of 2.  Plan to transition from Eliquis to [place] for stroke prophylaxis due to change in insurance coverage.  Follow-up CBC and BMET.  Medication  Adjustments/Labs and Tests Ordered: Current medicines are reviewed at length with the patient today.  Concerns regarding medicines are outlined above.   Tests Ordered: Orders Placed This Encounter  Procedures  . CBC  . Basic metabolic panel  . ECHOCARDIOGRAM COMPLETE    Medication Changes: Meds ordered this encounter  Medications  . metoprolol succinate (TOPROL-XL) 50 MG 24 hr tablet    Sig: Take 1 tablet (50 mg total) by mouth daily. Take with or immediately following a meal.    Dispense:  90 tablet    Refill:  3  . rivaroxaban (XARELTO) 20 MG TABS tablet    Sig: Take 1 tablet (20 mg total) by mouth daily with supper.    Dispense:  30 tablet    Refill:  11    Pt to start after he completes current supply of Eliquis    Disposition:  Follow up 6 months in the [place] office.  Signed, Satira Sark, MD, [facility] 05/21/2020 8:55 AM    [facility] [place]. [address] [place], [place] [address] Phone: [phone]; Fax: [phone]

## 2020-05-21 ENCOUNTER — Ambulatory Visit (INDEPENDENT_AMBULATORY_CARE_PROVIDER_SITE_OTHER): Payer: BC Managed Care – PPO | Admitting: Cardiology

## 2020-05-21 ENCOUNTER — Other Ambulatory Visit: Payer: Self-pay

## 2020-05-21 ENCOUNTER — Encounter: Payer: Self-pay | Admitting: Cardiology

## 2020-05-21 ENCOUNTER — Telehealth (INDEPENDENT_AMBULATORY_CARE_PROVIDER_SITE_OTHER): Payer: Self-pay

## 2020-05-21 ENCOUNTER — Encounter (INDEPENDENT_AMBULATORY_CARE_PROVIDER_SITE_OTHER): Payer: Self-pay

## 2020-05-21 ENCOUNTER — Other Ambulatory Visit (INDEPENDENT_AMBULATORY_CARE_PROVIDER_SITE_OTHER): Payer: Self-pay

## 2020-05-21 ENCOUNTER — Other Ambulatory Visit (HOSPITAL_COMMUNITY)
Admission: RE | Admit: 2020-05-21 | Discharge: 2020-05-21 | Disposition: A | Discharge status: Home / Self Care | Payer: BC Managed Care – PPO | Source: Ambulatory Visit | Attending: Cardiology | Admitting: Cardiology

## 2020-05-21 VITALS — BP 142/98 | HR 68 | Ht 74.0 in | Wt 202.0 lb

## 2020-05-21 DIAGNOSIS — Z953 Presence of xenogenic heart valve: Secondary | ICD-10-CM | POA: Diagnosis not present

## 2020-05-21 DIAGNOSIS — I10 Essential (primary) hypertension: Secondary | ICD-10-CM

## 2020-05-21 DIAGNOSIS — I480 Paroxysmal atrial fibrillation: Secondary | ICD-10-CM

## 2020-05-21 DIAGNOSIS — Z1211 Encounter for screening for malignant neoplasm of colon: Secondary | ICD-10-CM

## 2020-05-21 DIAGNOSIS — Z79899 Other long term (current) drug therapy: Secondary | ICD-10-CM | POA: Diagnosis not present

## 2020-05-21 LAB — BASIC METABOLIC PANEL
Anion gap: 6 (ref 5–15)
BUN: 17 mg/dL (ref 6–20)
CO2: 28 mmol/L (ref 22–32)
Calcium: 9.4 mg/dL (ref 8.9–10.3)
Chloride: 104 mmol/L (ref 98–111)
Creatinine, Ser: 0.71 mg/dL (ref 0.61–1.24)
GFR, Estimated: >60 mL/min (ref >60)
Glucose, Bld: 105 mg/dL — ABNORMAL HIGH (ref 70–99)
Potassium: 5.1 mmol/L (ref 3.5–5.1)
Sodium: 138 mmol/L (ref 135–145)

## 2020-05-21 LAB — CBC
HCT: 49 % (ref 39.0–52.0)
Hemoglobin: 16.3 g/dL (ref 13.0–17.0)
MCH: 32.6 pg (ref 26.0–34.0)
MCHC: 33.3 g/dL (ref 30.0–36.0)
MCV: 98 fL (ref 80.0–100.0)
Platelets: 177 10*3/uL (ref 150–400)
RBC: 5 MIL/uL (ref 4.22–5.81)
RDW: 12.3 % (ref 11.5–15.5)
WBC: 4.5 10*3/uL (ref 4.0–10.5)
nRBC: 0 % (ref 0.0–0.2)

## 2020-05-21 MED ORDER — METOPROLOL SUCCINATE ER 50 MG PO TB24: 50.0000 mg | ORAL_TABLET | Freq: Every day | ORAL | 3 refills | Status: DC

## 2020-05-21 MED ORDER — RIVAROXABAN 20 MG PO TABS: 20.0000 mg | ORAL_TABLET | Freq: Every day | ORAL | 11 refills | Status: DC

## 2020-05-21 MED ORDER — PEG 3350-KCL-NA BICARB-NACL 420 G PO SOLR: 4000.0000 mL | ORAL | 0 refills | Status: DC

## 2020-05-21 NOTE — Telephone Encounter (Signed)
Referring MD/PCP: Jerelene Redden   Procedure: tcs  Reason/Indication:  screening  Has patient had this procedure before?  no  If so, when, by whom and where?    Is there a family history of colon cancer?  no  Who?  What age when diagnosed?    Is patient diabetic?   no      Does patient have prosthetic heart valve or mechanical valve?  yes  Do you have a pacemaker/defibrillator?  no  Has patient ever had endocarditis/atrial fibrillation? Yes both per patient   Have you had a stroke/heart attack last 6 mths? No   Does patient use oxygen? no  Has patient had joint replacement within last 12 months?  no  Is patient constipated or do they take laxatives? no  Does patient have a history of alcohol/drug use?  yes  Is patient on blood thinner such as Coumadin, Plavix and/or Aspirin? yes  Medications: atorvastatin 10mg  daily, metoprolol 25mg  daily, Eliquis 5 mg daily  Allergies: Ultram  Medication Adjustment per Dr Rehman/Dr Jenetta Downer Eliquis 5 mg 2 days prior  Procedure date & time: 05/28/20 9:00

## 2020-05-21 NOTE — Patient Instructions (Signed)
Medication Instructions:  Your physician has recommended you make the following change in your medication:   Stop taking Eliquis after current supply is complete then start Xarelto 20 mg Daily.   *If you need a refill on your cardiac medications before your next appointment, please call your pharmacy*   Lab Work: Your physician recommends that you return for lab work in: Today   If you have labs (blood work) drawn today and your tests are completely normal, you will receive your results only by: Marland Kitchen MyChart Message (if you have MyChart) OR . A paper copy in the mail If you have any lab test that is abnormal or we need to change your treatment, we will call you to review the results.   Testing/Procedures: Your physician has requested that you have an echocardiogram. Echocardiography is a painless test that uses sound waves to create images of your heart. It provides your doctor with information about the size and shape of your heart and how well your heart's chambers and valves are working. This procedure takes approximately one hour. There are no restrictions for this procedure.     Follow-Up: At [facility], you and your health needs are our priority.  As part of our continuing mission to provide you with exceptional heart care, we have created designated Provider Care Teams.  These Care Teams include your primary Cardiologist (physician) and Advanced Practice Providers (APPs -  Physician Assistants and Nurse Practitioners) who all work together to provide you with the care you need, when you need it.  We recommend signing up for the patient portal called "MyChart".  Sign up information is provided on this After Visit Summary.  MyChart is used to connect with patients for Virtual Visits (Telemedicine).  Patients are able to view lab/test results, encounter notes, upcoming appointments, etc.  Non-urgent messages can be sent to your provider as well.   To learn more about what you can do  with MyChart, go to [email or web address].    Your next appointment:   6 month(s)  The format for your next appointment:   In Person  Provider:   Rozann Lesches, MD   Other Instructions Thank you for choosing [facility]!

## 2020-05-21 NOTE — Telephone Encounter (Signed)
Donald [name], CMA  

## 2020-05-22 ENCOUNTER — Telehealth: Payer: Self-pay | Admitting: Radiology

## 2020-05-22 ENCOUNTER — Telehealth: Payer: Self-pay

## 2020-05-22 DIAGNOSIS — M25552 Pain in left hip: Secondary | ICD-10-CM

## 2020-05-22 NOTE — Telephone Encounter (Signed)
-----   Message from Satira Sark, MD sent at 05/21/2020 10:09 AM EST ----- Results reviewed.  Renal function normal with creatinine 0.71, potassium 5.1, and hemoglobin normal at 16.3.  Anticipate switch to Xarelto 20 mg daily when he finishes current doses of Eliquis.

## 2020-05-22 NOTE — Telephone Encounter (Signed)
I left message for patient to hold Eliquis 48 hours before colonoscopy.

## 2020-05-22 NOTE — Telephone Encounter (Signed)
I am fairly certain that I received communication from GI specialist yesterday regarding holding Eliquis 48 hours prior to the procedure.

## 2020-05-22 NOTE — Telephone Encounter (Signed)
his MRI was denied  pt only has the one appointment with Dr. Aline Brochure.  I called him to advise, he needs conservative treatment first Sent order for PT for him and told him to expect call

## 2020-05-22 NOTE — Telephone Encounter (Signed)
Patient states he has a colonoscopy scheduled for next Wednesday, 05/28/20 and wants to know if he can safely stop Eliquis and if so, when to stop.

## 2020-05-23 NOTE — Patient Instructions (Signed)
Donald [name]  05/23/2020     @PREFPERIOPPHARMACY @   Your procedure is scheduled on  05/28/2020    Report to [facility] at  [email or web address].M.   Call this number if you have problems the morning of surgery:  [phone]   Remember:  Follow the diet and prep instructions given to you by the office.                     Take these medicines the morning of surgery with A SIP OF WATER              Metoprolol   Please brush your teeth.  Do not wear jewelry, make-up or nail polish.  Do not wear lotions, powders, or perfumes, or deodorant.  Do not shave 48 hours prior to surgery.  Men may shave face and neck.  Do not bring valuables to the hospital.  [facility] is not responsible for any belongings or valuables.  Contacts, dentures or bridgework may not be worn into surgery.  Leave your suitcase in the car.  After surgery it may be brought to your room.  For patients admitted to the hospital, discharge time will be determined by your treatment team.  Patients discharged the day of surgery will not be allowed to drive home and must have someone with them for 24 hours.   Special instructions:  DO NOT smoke tobacco or vape the morning of your procedure.  Please read over the following fact sheets that you were given. Anesthesia Post-op Instructions and Care and Recovery After Surgery       Colonoscopy, Adult, Care After This sheet gives you information about how to care for yourself after your procedure. Your health care provider may also give you more specific instructions. If you have problems or questions, contact your health care provider. What can I expect after the procedure? After the procedure, it is common to have:  A small amount of blood in your stool for 24 hours after the procedure.  Some gas.  Mild cramping or bloating of your abdomen. Follow these instructions at home: Eating and drinking  Drink enough fluid to keep your urine pale yellow.  Follow  instructions from your health care provider about eating or drinking restrictions.  Resume your normal diet as instructed by your health care provider. Avoid heavy or fried foods that are hard to digest.   Activity  Rest as told by your health care provider.  Avoid sitting for a long time without moving. Get up to take short walks every 1-2 hours. This is important to improve blood flow and breathing. Ask for help if you feel weak or unsteady.  Return to your normal activities as told by your health care provider. Ask your health care provider what activities are safe for you. Managing cramping and bloating  Try walking around when you have cramps or feel bloated.  Apply heat to your abdomen as told by your health care provider. Use the heat source that your health care provider recommends, such as a moist heat pack or a heating pad. ? Place a towel between your skin and the heat source. ? Leave the heat on for 20-30 minutes. ? Remove the heat if your skin turns bright red. This is especially important if you are unable to feel pain, heat, or cold. You may have a greater risk of getting burned.   General instructions  If you were given a  sedative during the procedure, it can affect you for several hours. Do not drive or operate machinery until your health care provider says that it is safe.  For the first 24 hours after the procedure: ? Do not sign important documents. ? Do not drink alcohol. ? Do your regular daily activities at a slower pace than normal. ? Eat soft foods that are easy to digest.  Take over-the-counter and prescription medicines only as told by your health care provider.  Keep all follow-up visits as told by your health care provider. This is important. Contact a health care provider if:  You have blood in your stool 2-3 days after the procedure. Get help right away if you have:  More than a small spotting of blood in your stool.  Large blood clots in your  stool.  Swelling of your abdomen.  Nausea or vomiting.  A fever.  Increasing pain in your abdomen that is not relieved with medicine. Summary  After the procedure, it is common to have a small amount of blood in your stool. You may also have mild cramping and bloating of your abdomen.  If you were given a sedative during the procedure, it can affect you for several hours. Do not drive or operate machinery until your health care provider says that it is safe.  Get help right away if you have a lot of blood in your stool, nausea or vomiting, a fever, or increased pain in your abdomen. This information is not intended to replace advice given to you by your health care provider. Make sure you discuss any questions you have with your health care provider. Document Revised: 04/06/2019 Document Reviewed: 11/06/2018 Elsevier Patient Education  2021 [place] After This sheet gives you information about how to care for yourself after your procedure. Your health care provider may also give you more specific instructions. If you have problems or questions, contact your health care provider. What can I expect after the procedure? After the procedure, it is common to have:  Tiredness.  Forgetfulness about what happened after the procedure.  Impaired judgment for important decisions.  Nausea or vomiting.  Some difficulty with balance. Follow these instructions at home: For the time period you were told by your health care provider:  Rest as needed.  Do not participate in activities where you could fall or become injured.  Do not drive or use machinery.  Do not drink alcohol.  Do not take sleeping pills or medicines that cause drowsiness.  Do not make important decisions or sign legal documents.  Do not take care of children on your own.      Eating and drinking  Follow the diet that is recommended by your health care provider.  Drink  enough fluid to keep your urine pale yellow.  If you vomit: ? Drink water, juice, or soup when you can drink without vomiting. ? Make sure you have little or no nausea before eating solid foods. General instructions  Have a responsible adult stay with you for the time you are told. It is important to have someone help care for you until you are awake and alert.  Take over-the-counter and prescription medicines only as told by your health care provider.  If you have sleep apnea, surgery and certain medicines can increase your risk for breathing problems. Follow instructions from your health care provider about wearing your sleep device: ? Anytime you are sleeping, including during daytime naps. ? While taking  prescription pain medicines, sleeping medicines, or medicines that make you drowsy.  Avoid smoking.  Keep all follow-up visits as told by your health care provider. This is important. Contact a health care provider if:  You keep feeling nauseous or you keep vomiting.  You feel light-headed.  You are still sleepy or having trouble with balance after 24 hours.  You develop a rash.  You have a fever.  You have redness or swelling around the IV site. Get help right away if:  You have trouble breathing.  You have new-onset confusion at home. Summary  For several hours after your procedure, you may feel tired. You may also be forgetful and have poor judgment.  Have a responsible adult stay with you for the time you are told. It is important to have someone help care for you until you are awake and alert.  Rest as told. Do not drive or operate machinery. Do not drink alcohol or take sleeping pills.  Get help right away if you have trouble breathing, or if you suddenly become confused. This information is not intended to replace advice given to you by your health care provider. Make sure you discuss any questions you have with your health care provider. Document Revised:  12/27/2019 Document Reviewed: 03/15/2019 Elsevier Patient Education  2021 [employer].

## 2020-05-27 ENCOUNTER — Encounter (HOSPITAL_COMMUNITY)
Admission: RE | Admit: 2020-05-27 | Discharge: 2020-05-27 | Disposition: A | Discharge status: Home / Self Care | Payer: BC Managed Care – PPO | Source: Ambulatory Visit | Attending: Internal Medicine | Admitting: Internal Medicine

## 2020-05-27 ENCOUNTER — Other Ambulatory Visit (HOSPITAL_COMMUNITY): Payer: BC Managed Care – PPO

## 2020-06-02 ENCOUNTER — Other Ambulatory Visit: Payer: Self-pay

## 2020-06-02 ENCOUNTER — Ambulatory Visit (HOSPITAL_COMMUNITY): Payer: BC Managed Care – PPO | Attending: Orthopedic Surgery

## 2020-06-02 ENCOUNTER — Encounter (HOSPITAL_COMMUNITY): Payer: Self-pay

## 2020-06-02 DIAGNOSIS — R262 Difficulty in walking, not elsewhere classified: Secondary | ICD-10-CM | POA: Insufficient documentation

## 2020-06-02 DIAGNOSIS — M25552 Pain in left hip: Secondary | ICD-10-CM | POA: Diagnosis not present

## 2020-06-02 NOTE — Patient Instructions (Signed)
Access Code: [email or web address] URL: [email or web address] Date: 06/02/2020 Prepared by: Sherlyn Lees  Exercises Beginner Front Arm Support - 1 x daily - 7 x weekly - 3 sets - 10 reps - 3 sec hold Prone Quadriceps Stretch with Strap - 3 x daily - 7 x weekly - 3 sets - 3 reps - 60 sec hold

## 2020-06-02 NOTE — Therapy (Addendum)
[facility] [address] [place], [place], [address] Phone: [phone]   Fax:  [phone]  Physical Therapy Evaluation  Patient Details  Name: Donald [name] MRN: [id number] Date of Birth: October 01, 1965 Referring Provider (PT): Arther Abbott   Encounter Date: 06/02/2020   PT End of Session - 06/02/20 1600    Visit Number 1    Number of Visits 4    Date for PT Re-Evaluation 06/30/20    Authorization Type BCBS COMM PPO    Authorization Time Period VL based on medical necessity, no auth    PT Start Time 1601    PT Stop Time 1640    PT Time Calculation (min) 39 min    Activity Tolerance Patient tolerated treatment well    Behavior During Therapy [facility] for tasks assessed/performed           Past Medical History:  Diagnosis Date  . Acute gout of left foot 10/03/2019  . Bone tumor 11/22/2013  . Cardiomyopathy ([place])    a. EF 30 to 35% by echo in 06/2019 in the setting of severe AI  . Encounter for screening for malignant neoplasm of colon 06/27/2019  . Glenoid labral tear 10/27/2010  . History of gout 06/27/2019  . Hyperlipidemia   . Patent foramen ovale   . Rotator cuff tear, right 10/27/2010  . S/P aortic valve replacement with bioprosthetic valve 08/09/2019   29 mm Edwards Inspiris Resilia stented bovine pericardial tissue valve  . S/P patent foramen ovale closure 08/09/2019  . Severe aortic insufficiency     Past Surgical History:  Procedure Laterality Date  . AORTIC VALVE REPLACEMENT N/A 08/09/2019   Procedure: AORTIC VALVE REPLACEMENT (AVR) using Margaretha Sheffield Resilia 29 MM Aortic Valve.;  Surgeon: Rexene Alberts, MD;  Location: [facility];  Service: Open Heart Surgery;  Laterality: N/A;  . BACK SURGERY     multiple back surgery  . Benign tumor     Left shin  . BUBBLE STUDY  07/05/2019   Procedure: BUBBLE STUDY;  Surgeon: Satira Sark, MD;  Location: [facility];  Service: Cardiovascular;;  . CARDIAC VALVE REPLACEMENT N/A     Phreesia 02/19/2020  . HERNIA REPAIR    . REPAIR OF PATENT FORAMEN OVALE N/A 08/09/2019   Procedure: Closure Of Patent Foramen Ovale;  Surgeon: Rexene Alberts, MD;  Location: [facility];  Service: Open Heart Surgery;  Laterality: N/A;  . RIGHT/LEFT HEART CATH AND CORONARY ANGIOGRAPHY N/A 07/26/2019   Procedure: RIGHT/LEFT HEART CATH AND CORONARY ANGIOGRAPHY;  Surgeon: Burnell Blanks, MD;  Location: [facility];  Service: Cardiovascular;  Laterality: N/A;  . SHOULDER SURGERY Left   . SHOULDER SURGERY Right   . TEE WITHOUT CARDIOVERSION N/A 07/05/2019   Procedure: TRANSESOPHAGEAL ECHOCARDIOGRAM (TEE) WITH PROPOFOL;  Surgeon: Satira Sark, MD;  Location: [facility];  Service: Cardiovascular;  Laterality: N/A;  . TEE WITHOUT CARDIOVERSION N/A 08/09/2019   Procedure: TRANSESOPHAGEAL ECHOCARDIOGRAM (TEE);  Surgeon: Rexene Alberts, MD;  Location: [facility];  Service: Open Heart Surgery;  Laterality: N/A;  . Thumb surgery Left x3  . VASECTOMY N/A    Phreesia 02/19/2020    There were no vitals filed for this visit.    Subjective Assessment - 06/02/20 1603    Subjective PAtient reports left hip pain since around 02/2020 and reports increased pain when sedentary/sitting for a while and then transfers notices increased left lateral hip pain. Sharp pains noted and progressively improves with activity/mobility.  No specific injury or incident noted to be culprit of the pain    How long can you sit comfortably? 10 minutes    How long can you stand comfortably? no issues    Diagnostic tests MRI denied by insurance    Currently in Pain? Yes    Pain Score 5    when sitting x greater than 10 min   Pain Location Hip    Pain Orientation Left    Pain Descriptors / Indicators Sharp    Pain Type Acute pain    Pain Onset More than a month ago    Pain Frequency Intermittent    Aggravating Factors  prolonged sitting    Pain Relieving Factors movement              [facility] PT Assessment - 06/02/20  0001      Assessment   Medical Diagnosis Left hip pain    Referring Provider (PT) Arther Abbott      Balance Screen   Has the patient fallen in the past 6 months No    Has the patient had a decrease in activity level because of a fear of falling?  No    Is the patient reluctant to leave their home because of a fear of falling?  No      Prior Function   Level of Independence Independent    Vocation Full time employment    Leisure target shooting      Observation/Other Assessments   Focus on Therapeutic Outcomes (FOTO)  67% function      ROM / Strength   AROM / PROM / Strength AROM;Strength;PROM      AROM   Overall AROM  Within functional limits for tasks performed    AROM Assessment Site Hip      PROM   Overall PROM Comments pain with left hip overpressure into external rotation      Strength   Strength Assessment Site Hip    Right/Left Hip Left;Right    Right Hip Flexion 5/5    Right Hip Extension 5/5    Right Hip External Rotation  5/5    Right Hip Internal Rotation 5/5    Right Hip ABduction 5/5    Right Hip ADduction 5/5    Left Hip Flexion 4/5    Left Hip Extension 3+/5    Left Hip External Rotation 3+/5    Left Hip Internal Rotation 3+/5    Left Hip ABduction 3+/5    Left Hip ADduction 3+/5      Flexibility   Soft Tissue Assessment /Muscle Length yes    Quadriceps +Thomas Test      Palpation   Palpation comment unrevealing for tenderness. No pain with pelvic compression      Special Tests    Special Tests Lumbar;Hip Special Tests    Lumbar Tests FABER test      FABER test   findings Positive    Side LEft                      Objective measurements completed on examination: See above findings.       [facility] Adult PT Treatment/Exercise - 06/02/20 0001      Exercises   Exercises Knee/Hip;Lumbar      Lumbar Exercises: Quadruped   Other Quadruped Lumbar Exercises hip extension 3x10      Knee/Hip Exercises: Stretches   Engineer, civil (consulting) Left;3 reps;60 seconds   prone  PT Education - 06/02/20 1748    Education Details pt education on assessment findings and rationale for POC details    Person(s) Educated Patient    Methods Explanation    Comprehension Verbalized understanding            PT Short Term Goals - 06/02/20 1754      PT SHORT TERM GOAL #1   Title Patient will report at least 25% improvement in symptoms for improved quality of life.    Time 2    Period Weeks    Status New    Target Date 06/16/20      PT SHORT TERM GOAL #2   Title Patient will report being able to sit x 30 minutes without increased left hip of 3/10 pain to improve QOL    Baseline 5/10 with sitting 10 minutes or more    Time 2    Period Weeks    Status New    Target Date 06/16/20      PT SHORT TERM GOAL #3   Title Patient will demonstrate negative FABER sign left LE to demonstrate tissue healing    Baseline +FABER LLE    Time 2    Period Weeks    Status New    Target Date 06/16/20             PT Long Term Goals - 06/02/20 1757      PT LONG TERM GOAL #1   Title Patient will improve on FOTO score to meet predicted outcomes to improve functional independence    Time 4    Period Weeks    Status New    Target Date 06/30/20      PT LONG TERM GOAL #2   Title Patient will demo left hip strength 5/5 to improve stability and joint kinematics during ambulation    Baseline 3+/5    Time 4    Period Weeks    Status New    Target Date 06/30/20                  Plan - 06/02/20 1749    Clinical Impression Statement Patient is 55 yo male who presents with insidious onset left hip pain which is worse after periods of inactivity followed by mobility and transfers.  Patient notes pain worse in AM and after prolonged sitting which limits his activity tolerance.  Patient notes difficulty and discomfort with performing his regular job duties and would benefit from PT services to decrease left hip  pain and improve stability to facilitate greater activity tolerance and participation in work/home activities    Personal Factors and Comorbidities Comorbidity 1;Profession;Time since onset of injury/illness/exacerbation    Comorbidities PMH    Examination-Activity Limitations Bend;Squat;Sit    Examination-Participation Restrictions Occupation;Driving;Interpersonal Relationship    Stability/Clinical Decision Making Stable/Uncomplicated    Clinical Decision Making Low    Rehab Potential Good    PT Frequency 1x / week    PT Duration 4 weeks    PT Treatment/Interventions ADLs/Self Care Home Management;Aquatic Therapy;Cryotherapy;DME Instruction;Ultrasound;Traction;Gait training;Stair training;Functional mobility training;Therapeutic activities;Therapeutic exercise;Balance training;Patient/family education;Neuromuscular re-education;Manual techniques;Passive range of motion;Taping;Energy conservation;Dry needling;Spinal Manipulations;Joint Manipulations    PT Next Visit Plan Continue with treatment for lateral left hip pain. Hip flexor stretching, extensor strengthening    PT Home Exercise Plan prone quad stretch, quadruped hip extension    Consulted and Agree with Plan of Care Patient           Patient will benefit from skilled therapeutic  intervention in order to improve the following deficits and impairments:  Decreased activity tolerance,Decreased mobility,Decreased strength,Difficulty walking,Impaired flexibility,Pain  Visit Diagnosis: Pain in left hip  Difficulty in walking, not elsewhere classified     Problem List Patient Active Problem List   Diagnosis Date Noted  . COVID-19 04/16/2020  . Muscle cramps at night 03/27/2020  . Annual visit for general adult medical examination with abnormal findings 02/20/2020  . Nicotine abuse 02/20/2020  . Need for immunization against influenza 02/20/2020  . Left hip pain 02/20/2020  . Encounter for screening for malignant neoplasm of  prostate 02/20/2020  . S/P aortic valve replacement with bioprosthetic valve 08/09/2019  . S/P patent foramen ovale closure 08/09/2019  . S/P AVR (aortic valve replacement) 08/09/2019  . Hyperlipidemia 06/27/2019  . Overweight (BMI 25.0-29.9) 06/27/2019  . Essential hypertension 06/27/2019  . Vitamin D deficiency 06/27/2019  . Heel pain, bilateral 06/27/2019  . RBBB (right bundle branch block) 06/27/2019  . LVH (left ventricular hypertrophy) 06/27/2019  . Alcohol use 06/27/2019    6:00 PM, 06/02/20 M. Sherlyn Lees, PT, DPT Physical Therapist- [place] Office Number: [phone]   PHYSICAL THERAPY DISCHARGE SUMMARY  06/25/2020  Visits from Start of Care: 1  Current functional level related to goals / functional outcomes: See evaluation   Remaining deficits: See evaluation/assessment   Education / Equipment: HEP initiated after evaluation Plan: Patient agrees to discharge.  Patient goals were not met. Patient is being discharged due to not returning since the last visit.  ?????      [place], [place], [address] Phone: [phone]   Fax:  [phone]  Name: Donald [name] MRN: [id number] Date of Birth: [birth year]

## 2020-06-09 ENCOUNTER — Telehealth (INDEPENDENT_AMBULATORY_CARE_PROVIDER_SITE_OTHER): Payer: Self-pay | Admitting: Internal Medicine

## 2020-06-09 NOTE — Telephone Encounter (Signed)
Noted Tcs has been canceled

## 2020-06-09 NOTE — Telephone Encounter (Signed)
Patient left voice mail message stating he wants to cancel his colonoscopy scheduled for 2/16

## 2020-06-09 NOTE — Telephone Encounter (Signed)
Thanks

## 2020-06-10 ENCOUNTER — Encounter (HOSPITAL_COMMUNITY): Payer: Self-pay

## 2020-06-10 ENCOUNTER — Other Ambulatory Visit (HOSPITAL_COMMUNITY): Payer: BC Managed Care – PPO

## 2020-06-10 ENCOUNTER — Encounter (HOSPITAL_COMMUNITY): Payer: BC Managed Care – PPO

## 2020-06-11 ENCOUNTER — Encounter (HOSPITAL_COMMUNITY): Admission: RE | Payer: Self-pay | Source: Home / Self Care

## 2020-06-11 ENCOUNTER — Ambulatory Visit (HOSPITAL_COMMUNITY)
Admission: RE | Admit: 2020-06-11 | Payer: BC Managed Care – PPO | Source: Home / Self Care | Admitting: Internal Medicine

## 2020-06-11 SURGERY — COLONOSCOPY WITH PROPOFOL
Anesthesia: Monitor Anesthesia Care

## 2020-06-12 ENCOUNTER — Ambulatory Visit (HOSPITAL_COMMUNITY): Payer: BC Managed Care – PPO

## 2020-06-12 ENCOUNTER — Telehealth (HOSPITAL_COMMUNITY): Payer: Self-pay

## 2020-06-12 NOTE — Telephone Encounter (Signed)
pt lmonvm to cx this appt pt had to work

## 2020-06-18 ENCOUNTER — Telehealth (HOSPITAL_COMMUNITY): Payer: Self-pay

## 2020-06-18 ENCOUNTER — Ambulatory Visit (HOSPITAL_COMMUNITY): Payer: BC Managed Care – PPO

## 2020-06-18 NOTE — Telephone Encounter (Signed)
pt called to cx today's appt due to he injured his foot at work and had to go home

## 2020-06-25 ENCOUNTER — Ambulatory Visit (HOSPITAL_COMMUNITY): Payer: BC Managed Care – PPO

## 2020-06-25 ENCOUNTER — Ambulatory Visit (HOSPITAL_COMMUNITY): Payer: Self-pay

## 2020-06-25 ENCOUNTER — Telehealth (HOSPITAL_COMMUNITY): Payer: Self-pay

## 2020-06-25 DIAGNOSIS — M25552 Pain in left hip: Secondary | ICD-10-CM

## 2020-06-25 DIAGNOSIS — R262 Difficulty in walking, not elsewhere classified: Secondary | ICD-10-CM

## 2020-06-25 NOTE — Telephone Encounter (Signed)
Pt called request to be D/c his HEP is working great for him.

## 2020-06-25 NOTE — Therapy (Deleted)
[place], [place], [address] Phone: [phone]   Fax:  [phone]  Patient Details  Name: Donald [name] MRN: [id number] Date of Birth: Feb 26, 1966 Referring Provider:  No ref. provider found  Encounter Date: 06/25/2020  PHYSICAL THERAPY DISCHARGE SUMMARY  Visits from Start of Care: 1  Current functional level related to goals / functional outcomes: See evaluation   Remaining deficits: Unable to assess, did not return   Education / Equipment: HEP initiated after evaluation Plan: Patient agrees to discharge.  Patient goals were not met. Patient is being discharged due to not returning since the last visit.  ?????     3:47 PM, 06/25/20 M. Sherlyn Lees, PT, DPT Physical Therapist- [place] Office Number: [phone]  [place] [facility] [address] [place], [place], [address] Phone: [phone]   Fax:  [phone]

## 2020-06-25 NOTE — Therapy (Deleted)
[place], [place], [address] Phone: [phone]   Fax:  [phone]  Patient Details  Name: CHRIST FULLENWIDER MRN: [id number] Date of Birth: Feb 24, 1966 Referring Provider:  No ref. provider found  Encounter Date: 06/25/2020  PHYSICAL THERAPY DISCHARGE SUMMARY  Visits from Start of Care: 1  Current functional level related to goals / functional outcomes: See evaluation   Remaining deficits: Did not return after evaluation Education / Equipment: Implemented HEP after initial evaluation  Plan: Patient agrees to discharge.  Patient goals were not met. Patient is being discharged due to not returning since the last visit.  ?????     3:45 PM, 06/25/20 M. Sherlyn Lees, PT, DPT Physical Therapist- [place] Office Number: [phone]  [place] [address] [place], [place], [address] Phone: [phone]   Fax:  [phone]

## 2020-07-02 ENCOUNTER — Encounter (HOSPITAL_COMMUNITY): Payer: BC Managed Care – PPO

## 2020-07-12 ENCOUNTER — Other Ambulatory Visit: Payer: Self-pay | Admitting: Student

## 2020-07-15 ENCOUNTER — Ambulatory Visit (HOSPITAL_COMMUNITY)
Admission: RE | Admit: 2020-07-15 | Discharge: 2020-07-15 | Disposition: A | Discharge status: Home / Self Care | Payer: BC Managed Care – PPO | Source: Ambulatory Visit | Attending: Internal Medicine | Admitting: Internal Medicine

## 2020-07-15 ENCOUNTER — Encounter: Payer: Self-pay | Admitting: Internal Medicine

## 2020-07-15 ENCOUNTER — Other Ambulatory Visit: Payer: Self-pay

## 2020-07-15 ENCOUNTER — Ambulatory Visit (INDEPENDENT_AMBULATORY_CARE_PROVIDER_SITE_OTHER): Payer: BC Managed Care – PPO | Admitting: Internal Medicine

## 2020-07-15 VITALS — BP 149/90 | HR 68 | Resp 18 | Ht 74.0 in | Wt 202.0 lb

## 2020-07-15 DIAGNOSIS — N5082 Scrotal pain: Secondary | ICD-10-CM | POA: Diagnosis not present

## 2020-07-15 DIAGNOSIS — N453 Epididymo-orchitis: Secondary | ICD-10-CM | POA: Diagnosis not present

## 2020-07-15 MED ORDER — DOXYCYCLINE HYCLATE 100 MG PO TABS: 100.0000 mg | ORAL_TABLET | Freq: Two times a day (BID) | ORAL | 0 refills | Status: DC

## 2020-07-15 MED ORDER — CEFTRIAXONE SODIUM 500 MG IJ SOLR
500.0000 mg | Freq: Once | INTRAMUSCULAR | Status: AC
  Administered 2020-07-15: 500 mg via INTRAMUSCULAR

## 2020-07-15 NOTE — Patient Instructions (Addendum)
You are being scheduled to get Doppler ultrasound of the scrotum.  We will contact you with the results and further steps. Epididymitis  Epididymitis is swelling (inflammation) or infection of the epididymis. The epididymis is a cord-like structure that is located along the top and back part of the testicle. It collects and stores sperm from the testicle. This condition can also cause pain and swelling of the testicle and scrotum. Symptoms usually start suddenly (acute epididymitis). Sometimes epididymitis starts gradually and lasts for a while (chronic epididymitis). This type may be harder to treat. What are the causes? In men ages 23-40, this condition is usually caused by a bacterial infection or a sexually transmitted disease (STD), such as:  Gonorrhea.  Chlamydia. In men 73 and older who do not have anal sex, this condition is usually caused by bacteria from a blockage or from abnormalities in the urinary system. These can result from:  Having a tube placed into the bladder (urinary catheter).  Having an enlarged or inflamed prostate gland.  Having recently had urinary tract surgery.  Having a problem with a backward flow of urine (retrograde). In men who have a condition that weakens the body's defense system (immune system), such as HIV, this condition can be caused by:  Other bacteria, including tuberculosis and syphilis.  Viruses.  Fungi. Sometimes this condition occurs without infection. This may happen because of trauma or repetitive activities such as sports. What increases the risk? You are more likely to develop this condition if you have:  Unprotected sex with more than one partner.  Anal sex.  Recently had surgery.  A urinary catheter.  Urinary problems.  A suppressed immune system. What are the signs or symptoms? This condition usually begins suddenly with chills, fever, and pain behind the scrotum and in the testicle. Other symptoms include:  Swelling  of the scrotum, testicle, or both.  Pain when ejaculating or urinating.  Pain in the back or abdomen.  Nausea.  Itching and discharge from the penis.  A frequent need to pass urine.  Redness, increased warmth, and tenderness of the scrotum. How is this diagnosed? Your health care provider can diagnose this condition based on your symptoms and medical history. Your health care provider will also do a physical exam to ask about your symptoms and check your scrotum and testicle for swelling, pain, and redness. You may also have other tests, including:  Examination of discharge from the penis.  Urine tests for infections, such as STDs.  Ultrasound test for blood flow and inflammation. Your health care provider may test you for other STDs, including HIV. How is this treated? Treatment for this condition depends on the cause. If your condition is caused by a bacterial infection, oral antibiotic medicine may be prescribed. If the bacterial infection has spread to your blood, you may need to receive IV antibiotics. For both bacterial and nonbacterial epididymitis, you may be treated with:  Rest.  Elevation of the scrotum.  Pain medicines.  Anti-inflammatory medicines. Surgery may be needed to treat:  Bacterial epididymitis that causes pus to build up in the scrotum (abscess).  Chronic epididymitis that has not responded to other treatments. Follow these instructions at home: Medicines  Take over-the-counter and prescription medicines only as told by your health care provider.  If you were prescribed an antibiotic medicine, take it as told by your health care provider. Do not stop taking the antibiotic even if your condition improves. Sexual activity  If your epididymitis was caused by  an STD, avoid sexual activity until your treatment is complete.  Inform your sexual partner or partners if you test positive for an STD. They may need to be treated. Do not engage in sexual  activity with your partner or partners until their treatment is completed. Managing pain and swelling  If directed, elevate your scrotum and apply ice. ? Put ice in a plastic bag. ? Place a small towel or pillow between your legs. ? Rest your scrotum on the pillow or towel. ? Place another towel between your skin and the plastic bag. ? Leave the ice on for 20 minutes, 2-3 times a day.  Try taking a sitz bath to help with discomfort. This is a warm water bath that is taken while you are sitting down. The water should only come up to your hips and should cover your buttocks. Do this 3-4 times per day or as told by your health care provider.  Keep your scrotum elevated and supported while resting. Ask your health care provider if you should wear a scrotal support, such as a jockstrap. Wear it as told by your health care provider.   General instructions  Return to your normal activities as told by your health care provider. Ask your health care provider what activities are safe for you.  Drink enough fluid to keep your urine pale yellow.  Keep all follow-up visits as told by your health care provider. This is important. Contact a health care provider if:  You have a fever.  Your pain medicine is not helping.  Your pain is getting worse.  Your symptoms do not improve within 3 days. Summary  Epididymitis is swelling (inflammation) or infection of the epididymis. This condition can also cause pain and swelling of the testicle and scrotum.  Treatment for this condition depends on the cause. If your condition is caused by a bacterial infection, oral antibiotic medicine may be prescribed.  Inform your sexual partner or partners if you test positive for an STD. They may need to be treated. Do not engage in sexual activity with your partner or partners until their treatment is completed.  Contact a health care provider if your symptoms do not improve within 3 days. This information is not  intended to replace advice given to you by your health care provider. Make sure you discuss any questions you have with your health care provider. Document Revised: 02/13/2018 Document Reviewed: 02/14/2018 Elsevier Patient Education  2021 [employer].

## 2020-07-15 NOTE — Progress Notes (Signed)
Acute Office Visit  Subjective:    Patient ID: Donald [name], male    DOB: [birth year], 55 y.o.   MRN: [id number]  Chief Complaint  Patient presents with  . Groin Pain    Pt woke up at 3am felt like he had been kicked in scrotom has been hurting ever since not swollen     HPI Patient is in today for evaluation of acute onset left scrotal pain at 3 AM when he woke up to urinate. Pain is constant, sharp, radiating to suprapubic area. He states that the pain is worse with standing. Denies noticing any swelling or redness. Denies any recent heavy weight lifting. Denies any direct injury to scrotal area. Denies any urethral discharge, but reports penile area pain at times since last night. He is sexually active and has had vasectomy.  Past Medical History:  Diagnosis Date  . Acute gout of left foot 10/03/2019  . Bone tumor 11/22/2013  . Cardiomyopathy ([place])    a. EF 30 to 35% by echo in 06/2019 in the setting of severe AI  . Encounter for screening for malignant neoplasm of colon 06/27/2019  . Glenoid labral tear 10/27/2010  . History of gout 06/27/2019  . Hyperlipidemia   . Patent foramen ovale   . Rotator cuff tear, right 10/27/2010  . S/P aortic valve replacement with bioprosthetic valve 08/09/2019   29 mm Edwards Inspiris Resilia stented bovine pericardial tissue valve  . S/P patent foramen ovale closure 08/09/2019  . Severe aortic insufficiency     Past Surgical History:  Procedure Laterality Date  . AORTIC VALVE REPLACEMENT N/A 08/09/2019   Procedure: AORTIC VALVE REPLACEMENT (AVR) using Margaretha Sheffield Resilia 29 MM Aortic Valve.;  Surgeon: Rexene Alberts, MD;  Location: [facility];  Service: Open Heart Surgery;  Laterality: N/A;  . BACK SURGERY     multiple back surgery  . Benign tumor     Left shin  . BUBBLE STUDY  07/05/2019   Procedure: BUBBLE STUDY;  Surgeon: Satira Sark, MD;  Location: [facility];  Service: Cardiovascular;;  . CARDIAC VALVE REPLACEMENT N/A    Phreesia  02/19/2020  . HERNIA REPAIR    . REPAIR OF PATENT FORAMEN OVALE N/A 08/09/2019   Procedure: Closure Of Patent Foramen Ovale;  Surgeon: Rexene Alberts, MD;  Location: [facility];  Service: Open Heart Surgery;  Laterality: N/A;  . RIGHT/LEFT HEART CATH AND CORONARY ANGIOGRAPHY N/A 07/26/2019   Procedure: RIGHT/LEFT HEART CATH AND CORONARY ANGIOGRAPHY;  Surgeon: Burnell Blanks, MD;  Location: [facility];  Service: Cardiovascular;  Laterality: N/A;  . SHOULDER SURGERY Left   . SHOULDER SURGERY Right   . TEE WITHOUT CARDIOVERSION N/A 07/05/2019   Procedure: TRANSESOPHAGEAL ECHOCARDIOGRAM (TEE) WITH PROPOFOL;  Surgeon: Satira Sark, MD;  Location: [facility];  Service: Cardiovascular;  Laterality: N/A;  . TEE WITHOUT CARDIOVERSION N/A 08/09/2019   Procedure: TRANSESOPHAGEAL ECHOCARDIOGRAM (TEE);  Surgeon: Rexene Alberts, MD;  Location: [facility];  Service: Open Heart Surgery;  Laterality: N/A;  . Thumb surgery Left x3  . VASECTOMY N/A    Phreesia 02/19/2020    Family History  Problem Relation Age of Onset  . Other Father        Kidney    Social History   Socioeconomic History  . Marital status: Married    Spouse name: [name]   . Number of children: 4  . Years of education: Not on file  . Highest education level: Some  college, no degree  Occupational History  . Occupation: Nurse, adult: [employer]  Tobacco Use  . Smoking status: Former Smoker    Packs/day: 0.25    Types: Cigarettes    Quit date: 08/02/2019    Years since quitting: 0.9  . Smokeless tobacco: Never Used  . Tobacco comment: Cutting down < 5 cig / day  Vaping Use  . Vaping Use: Never used  Substance and Sexual Activity  . Alcohol use: Yes    Alcohol/week: 12.0 standard drinks    Types: 12 Shots of liquor per week    Comment: on Friday and Saturday  . Drug use: No  . Sexual activity: Yes    Birth control/protection: Surgical  Other Topics Concern  . Not on file  Social History  Narrative   Live [name] wife of 2 years, previously was widowed from first wife from cancer 01/2011      Four children      Enjoy: golf, target shooting       Diet:    Caffeine: 16-20 oz coffee    Water: 6-7 bottles       Wears seat belt    Smoke detectors at home    Does not use phone while driving    Social Determinants of Health   Financial Resource Strain: Low Risk   . Difficulty of Paying Living Expenses: Not hard at all  Food Insecurity: No Food Insecurity  . Worried About Charity fundraiser in the Last Year: Never true  . Ran Out of Food in the Last Year: Never true  Transportation Needs: No Transportation Needs  . Lack of Transportation (Medical): No  . Lack of Transportation (Non-Medical): No  Physical Activity: Sufficiently Active  . Days of Exercise per Week: 5 days  . Minutes of Exercise per Session: 50 min  Stress: No Stress Concern Present  . Feeling of Stress : Not at all  Social Connections: Moderately Isolated  . Frequency of Communication with Friends and Family: More than three times a week  . Frequency of Social Gatherings with Friends and Family: Once a week  . Attends Religious Services: Never  . Active Member of Clubs or Organizations: No  . Attends Archivist Meetings: Never  . Marital Status: Married  Human resources officer Violence: Not At Risk  . Fear of Current or Ex-Partner: No  . Emotionally Abused: No  . Physically Abused: No  . Sexually Abused: No    Outpatient Medications Prior to Visit  Medication Sig Dispense Refill  . allopurinol (ZYLOPRIM) 100 MG tablet TAKE 1 TABLET BY MOUTH EVERY DAY 90 tablet 2  . apixaban (ELIQUIS) 5 MG TABS tablet Take 5 mg by mouth 2 (two) times daily.    Marland Kitchen atorvastatin (LIPITOR) 10 MG tablet Take 10 mg by mouth daily.    . Cholecalciferol (VITAMIN D3) 50 MCG (2000 UT) TABS Take 2,000 Units by mouth daily.    . Coenzyme Q10 (COQ10) 100 MG CAPS Take 100 mg by mouth daily.    . metoprolol succinate  (TOPROL-XL) 50 MG 24 hr tablet Take 1 tablet (50 mg total) by mouth daily. Take with or immediately following a meal. 90 tablet 3  . Multiple Vitamin (MULTIVITAMIN WITH MINERALS) TABS tablet Take 1 tablet by mouth daily.    . Omega-3 Fatty Acids (FISH OIL PO) Take 2,000 mg by mouth daily.     . polyethylene glycol-electrolytes (TRILYTE) 420 g solution Take 4,000 mLs by mouth  as directed. 4000 mL 0  . rivaroxaban (XARELTO) 20 MG TABS tablet Take 1 tablet (20 mg total) by mouth daily with supper. (Patient not taking: Reported on 07/15/2020) 30 tablet 11   No facility-administered medications prior to visit.    Allergies  Allergen Reactions  . Ultram [Tramadol Hcl] Itching    "Tunnel vision" warm feeling all over and wanted to jump out of his skin    Review of Systems  Constitutional: Negative for chills and fever.  Respiratory: Negative for cough and shortness of breath.   Cardiovascular: Negative for chest pain and palpitations.  Gastrointestinal: Negative for abdominal pain, constipation, diarrhea, nausea and vomiting.  Genitourinary: Positive for penile pain and testicular pain. Negative for dysuria, flank pain, hematuria, penile swelling, scrotal swelling and urgency.  Skin: Negative for rash.       Objective:    Physical Exam Vitals reviewed.  Constitutional:      General: He is not in acute distress.    Appearance: He is not diaphoretic.  HENT:     Head: Normocephalic and atraumatic.     Nose: Nose normal.     Mouth/Throat:     Mouth: Mucous membranes are moist.  Eyes:     General: No scleral icterus.    Extraocular Movements: Extraocular movements intact.     Pupils: Pupils are equal, round, and reactive to light.  Cardiovascular:     Rate and Rhythm: Normal rate and regular rhythm.     Pulses: Normal pulses.     Heart sounds: No murmur heard.   Pulmonary:     Breath sounds: Normal breath sounds. No wheezing or rales.  Abdominal:     Palpations: Abdomen is soft.      Tenderness: There is no abdominal tenderness.  Genitourinary:    Penis: Normal.      Comments: Scrotal area warmth, no erythema No bulge or swelling Musculoskeletal:     Cervical back: Neck supple. No tenderness.     Right lower leg: No edema.     Left lower leg: No edema.  Skin:    General: Skin is warm.     Findings: No rash.  Neurological:     General: No focal deficit present.     Mental Status: He is alert and oriented to person, place, and time.  Psychiatric:        Mood and Affect: Mood normal.        Behavior: Behavior normal.     BP (!) 149/90 (BP Location: Right Arm, Patient Position: Sitting, Cuff Size: Normal)   Pulse 68   Resp 18   Ht 6\' 2"  (1.88 m)   Wt 202 lb (91.6 kg)   SpO2 95%   BMI 25.94 kg/m  Wt Readings from Last 3 Encounters:  07/15/20 202 lb (91.6 kg)  05/21/20 202 lb (91.6 kg)  05/01/20 201 lb (91.2 kg)    Health Maintenance Due  Topic Date Due  . Hepatitis C Screening  Never done  . HIV Screening  Never done  . COLONOSCOPY (Pts 45-36yrs Insurance coverage will need to be confirmed)  Never done  . COVID-19 Vaccine (3 - Booster for Moderna series) 02/29/2020    There are no preventive care reminders to display for this patient.   No results found for: TSH Lab Results  Component Value Date   WBC 4.5 05/21/2020   HGB 16.3 05/21/2020   HCT 49.0 05/21/2020   MCV 98.0 05/21/2020   PLT 177 05/21/2020  Lab Results  Component Value Date   NA 138 05/21/2020   K 5.1 05/21/2020   CO2 28 05/21/2020   GLUCOSE 105 (H) 05/21/2020   BUN 17 05/21/2020   CREATININE 0.71 05/21/2020   BILITOT 0.6 10/03/2019   ALKPHOS 57 08/12/2019   AST 14 10/03/2019   ALT 17 10/03/2019   PROT 6.8 10/03/2019   ALBUMIN 2.8 (L) 08/12/2019   CALCIUM 9.4 05/21/2020   ANIONGAP 6 05/21/2020   Lab Results  Component Value Date   CHOL 174 06/28/2019   Lab Results  Component Value Date   HDL 53 06/28/2019   Lab Results  Component Value Date   LDLCALC  76 06/28/2019   Lab Results  Component Value Date   TRIG 375 (H) 06/28/2019   Lab Results  Component Value Date   CHOLHDL 3.3 06/28/2019   Lab Results  Component Value Date   HGBA1C 5.4 08/07/2019       Assessment & Plan:   Problem List Items Addressed This Visit   None   Visit Diagnoses    Scrotal pain    -  Primary Ordered US scrotum as concern for torsion due to acute onset testicular pain   Relevant Orders   US SCROTUM W/DOPPLER (Completed)   Epididymoorchitis     US scrotum w/doppler negative for torsion Pain likely related to infectious process Patient will receive Ceftriaxone injection followed by Doxycycline 100 mg BID   Relevant Medications   doxycycline (VIBRA-TABS) 100 MG tablet       Meds ordered this encounter  Medications  . doxycycline (VIBRA-TABS) 100 MG tablet    Sig: Take 1 tablet (100 mg total) by mouth 2 (two) times daily.    Dispense:  20 tablet    Refill:  0     Yani Coventry Keith Rake, MD

## 2020-07-29 ENCOUNTER — Other Ambulatory Visit: Payer: Self-pay | Admitting: Family Medicine

## 2020-07-29 DIAGNOSIS — M109 Gout, unspecified: Secondary | ICD-10-CM

## 2020-08-01 ENCOUNTER — Telehealth: Payer: Self-pay

## 2020-08-01 ENCOUNTER — Other Ambulatory Visit: Payer: Self-pay

## 2020-08-01 NOTE — Telephone Encounter (Signed)
Called patient for verification on which anticoag medication he is currently taking. Both Xarelto and Eliquis are on his medication sheet. According to Dr. Myles Gip last office note (04/2020), patient was supposed to switch to Xarelto from Eliquis. According to Dr. Leeann Must), patient has not done so yet.  Patient stated that he started Xarelto on 07/27/2020 due to not having anymore Eliquis. I updated the patients medication list to reflect this.

## 2020-08-04 ENCOUNTER — Ambulatory Visit: Payer: BC Managed Care – PPO | Admitting: Thoracic Surgery (Cardiothoracic Vascular Surgery)

## 2020-08-20 ENCOUNTER — Encounter: Payer: Self-pay | Admitting: Nurse Practitioner

## 2020-08-20 ENCOUNTER — Ambulatory Visit (INDEPENDENT_AMBULATORY_CARE_PROVIDER_SITE_OTHER): Payer: BC Managed Care – PPO | Admitting: Nurse Practitioner

## 2020-08-20 ENCOUNTER — Other Ambulatory Visit: Payer: Self-pay

## 2020-08-20 VITALS — BP 134/89 | HR 62 | Temp 98.2°F | Resp 18 | Ht 74.0 in | Wt 202.0 lb

## 2020-08-20 DIAGNOSIS — I10 Essential (primary) hypertension: Secondary | ICD-10-CM

## 2020-08-20 DIAGNOSIS — Z139 Encounter for screening, unspecified: Secondary | ICD-10-CM

## 2020-08-20 DIAGNOSIS — E785 Hyperlipidemia, unspecified: Secondary | ICD-10-CM | POA: Diagnosis not present

## 2020-08-20 DIAGNOSIS — Z952 Presence of prosthetic heart valve: Secondary | ICD-10-CM | POA: Diagnosis not present

## 2020-08-20 NOTE — Assessment & Plan Note (Signed)
-  takes atorvastatin and omega-3 fish oil daily -will check fasting labs

## 2020-08-20 NOTE — Progress Notes (Signed)
Established Patient Office Visit  Subjective:  Patient ID: Donald [name], male    DOB: [birth year]  Age: 55 y.o. MRN: [id number]  CC:  Chief Complaint  Patient presents with  . Follow-up    Generalized medication check, blood work.   . Hyperlipidemia    HPI Donald [name] presents for lab follow-up. He has had no labs drawn prior to this visit.  No acute concerns. He is not fasting today.  Past Medical History:  Diagnosis Date  . Acute gout of left foot 10/03/2019  . Bone tumor 11/22/2013  . Cardiomyopathy ([place])    a. EF 30 to 35% by echo in 06/2019 in the setting of severe AI  . Encounter for screening for malignant neoplasm of colon 06/27/2019  . Glenoid labral tear 10/27/2010  . History of gout 06/27/2019  . Hyperlipidemia   . Patent foramen ovale   . Rotator cuff tear, right 10/27/2010  . S/P aortic valve replacement with bioprosthetic valve 08/09/2019   29 mm Edwards Inspiris Resilia stented bovine pericardial tissue valve  . S/P patent foramen ovale closure 08/09/2019  . Severe aortic insufficiency     Past Surgical History:  Procedure Laterality Date  . AORTIC VALVE REPLACEMENT N/A 08/09/2019   Procedure: AORTIC VALVE REPLACEMENT (AVR) using Margaretha Sheffield Resilia 29 MM Aortic Valve.;  Surgeon: Rexene Alberts, MD;  Location: [facility];  Service: Open Heart Surgery;  Laterality: N/A;  . BACK SURGERY     multiple back surgery  . Benign tumor     Left shin  . BUBBLE STUDY  07/05/2019   Procedure: BUBBLE STUDY;  Surgeon: Satira Sark, MD;  Location: [facility];  Service: Cardiovascular;;  . CARDIAC VALVE REPLACEMENT N/A    Phreesia 02/19/2020  . HERNIA REPAIR    . REPAIR OF PATENT FORAMEN OVALE N/A 08/09/2019   Procedure: Closure Of Patent Foramen Ovale;  Surgeon: Rexene Alberts, MD;  Location: [facility];  Service: Open Heart Surgery;  Laterality: N/A;  . RIGHT/LEFT HEART CATH AND CORONARY ANGIOGRAPHY N/A 07/26/2019   Procedure: RIGHT/LEFT HEART CATH AND CORONARY ANGIOGRAPHY;   Surgeon: Burnell Blanks, MD;  Location: [facility];  Service: Cardiovascular;  Laterality: N/A;  . SHOULDER SURGERY Left   . SHOULDER SURGERY Right   . TEE WITHOUT CARDIOVERSION N/A 07/05/2019   Procedure: TRANSESOPHAGEAL ECHOCARDIOGRAM (TEE) WITH PROPOFOL;  Surgeon: Satira Sark, MD;  Location: [facility];  Service: Cardiovascular;  Laterality: N/A;  . TEE WITHOUT CARDIOVERSION N/A 08/09/2019   Procedure: TRANSESOPHAGEAL ECHOCARDIOGRAM (TEE);  Surgeon: Rexene Alberts, MD;  Location: [facility];  Service: Open Heart Surgery;  Laterality: N/A;  . Thumb surgery Left x3  . VASECTOMY N/A    Phreesia 02/19/2020    Family History  Problem Relation Age of Onset  . Other Father        Kidney    Social History   Socioeconomic History  . Marital status: Married    Spouse name: [name]   . Number of children: 4  . Years of education: Not on file  . Highest education level: Some college, no degree  Occupational History  . Occupation: Nurse, adult: [employer]  Tobacco Use  . Smoking status: Former Smoker    Packs/day: 0.25    Types: Cigarettes    Quit date: 08/02/2019    Years since quitting: 1.0  . Smokeless tobacco: Never Used  . Tobacco comment: Cutting down < 5 cig /  day  Vaping Use  . Vaping Use: Never used  Substance and Sexual Activity  . Alcohol use: Yes    Alcohol/week: 12.0 standard drinks    Types: 12 Shots of liquor per week    Comment: on Friday and Saturday  . Drug use: No  . Sexual activity: Yes    Birth control/protection: Surgical  Other Topics Concern  . Not on file  Social History Narrative   Live [name] wife of 2 years, previously was widowed from first wife from cancer 01/2011      Four children      Enjoy: golf, target shooting       Diet:    Caffeine: 16-20 oz coffee    Water: 6-7 bottles       Wears seat belt    Smoke detectors at home    Does not use phone while driving    Social Determinants of Health    Financial Resource Strain: Low Risk   . Difficulty of Paying Living Expenses: Not hard at all  Food Insecurity: No Food Insecurity  . Worried About Charity fundraiser in the Last Year: Never true  . Ran Out of Food in the Last Year: Never true  Transportation Needs: No Transportation Needs  . Lack of Transportation (Medical): No  . Lack of Transportation (Non-Medical): No  Physical Activity: Sufficiently Active  . Days of Exercise per Week: 5 days  . Minutes of Exercise per Session: 50 min  Stress: No Stress Concern Present  . Feeling of Stress : Not at all  Social Connections: Moderately Isolated  . Frequency of Communication with Friends and Family: More than three times a week  . Frequency of Social Gatherings with Friends and Family: Once a week  . Attends Religious Services: Never  . Active Member of Clubs or Organizations: No  . Attends Archivist Meetings: Never  . Marital Status: Married  Human resources officer Violence: Not At Risk  . Fear of Current or Ex-Partner: No  . Emotionally Abused: No  . Physically Abused: No  . Sexually Abused: No    Outpatient Medications Prior to Visit  Medication Sig Dispense Refill  . atorvastatin (LIPITOR) 10 MG tablet Take 10 mg by mouth daily.    . Cholecalciferol (VITAMIN D3) 50 MCG (2000 UT) TABS Take 2,000 Units by mouth daily.    . Coenzyme Q10 (COQ10) 100 MG CAPS Take 100 mg by mouth daily.    . Multiple Vitamin (MULTIVITAMIN WITH MINERALS) TABS tablet Take 1 tablet by mouth daily.    . Omega-3 Fatty Acids (FISH OIL PO) Take 2,000 mg by mouth daily.     . rivaroxaban (XARELTO) 20 MG TABS tablet Take 1 tablet (20 mg total) by mouth daily with supper. 30 tablet 11  . allopurinol (ZYLOPRIM) 100 MG tablet TAKE 1 TABLET BY MOUTH EVERY DAY 90 tablet 2  . doxycycline (VIBRA-TABS) 100 MG tablet Take 1 tablet (100 mg total) by mouth 2 (two) times daily. 20 tablet 0  . polyethylene glycol-electrolytes (TRILYTE) 420 g solution Take  4,000 mLs by mouth as directed. 4000 mL 0  . metoprolol succinate (TOPROL-XL) 50 MG 24 hr tablet Take 1 tablet (50 mg total) by mouth daily. Take with or immediately following a meal. 90 tablet 3   No facility-administered medications prior to visit.    Allergies  Allergen Reactions  . Ultram [Tramadol Hcl] Itching    "Tunnel vision" warm feeling all over and wanted to jump out  of his skin    ROS Review of Systems  Constitutional: Negative.   Respiratory: Negative.   Cardiovascular: Negative.   Musculoskeletal: Negative.   Neurological: Negative.       Objective:    Physical Exam Constitutional:      Appearance: Normal appearance.  Cardiovascular:     Rate and Rhythm: Normal rate and regular rhythm.     Pulses: Normal pulses.     Heart sounds: Normal heart sounds.  Pulmonary:     Effort: Pulmonary effort is normal.     Breath sounds: Normal breath sounds.  Musculoskeletal:        General: Normal range of motion.  Neurological:     Mental Status: He is alert.  Psychiatric:        Mood and Affect: Mood normal.        Behavior: Behavior normal.        Thought Content: Thought content normal.        Judgment: Judgment normal.     BP 134/89   Pulse 62   Temp 98.2 F (36.8 C)   Resp 18   Ht _0  (1.88 m)   Wt 202 lb (91.6 kg)   SpO2 96%   BMI 25.94 kg/m  Wt Readings from Last 3 Encounters:  08/20/20 202 lb (91.6 kg)  07/15/20 202 lb (91.6 kg)  05/21/20 202 lb (91.6 kg)     Health Maintenance Due  Topic Date Due  . Hepatitis C Screening  Never done  . HIV Screening  Never done  . COLONOSCOPY (Pts 45-24yr Insurance coverage will need to be confirmed)  Never done    There are no preventive care reminders to display for this patient.  No results found for: TSH Lab Results  Component Value Date   WBC 4.5 05/21/2020   HGB 16.3 05/21/2020   HCT 49.0 05/21/2020   MCV 98.0 05/21/2020   PLT 177 05/21/2020   Lab Results  Component Value Date   NA  138 05/21/2020   K 5.1 05/21/2020   CO2 28 05/21/2020   GLUCOSE 105 (H) 05/21/2020   BUN 17 05/21/2020   CREATININE 0.71 05/21/2020   BILITOT 0.6 10/03/2019   ALKPHOS 57 08/12/2019   AST 14 10/03/2019   ALT 17 10/03/2019   PROT 6.8 10/03/2019   ALBUMIN 2.8 (L) 08/12/2019   CALCIUM 9.4 05/21/2020   ANIONGAP 6 05/21/2020   Lab Results  Component Value Date   CHOL 174 06/28/2019   Lab Results  Component Value Date   HDL 53 06/28/2019   Lab Results  Component Value Date   LDLCALC 76 06/28/2019   Lab Results  Component Value Date   TRIG 375 (H) 06/28/2019   Lab Results  Component Value Date   CHOLHDL 3.3 06/28/2019   Lab Results  Component Value Date   HGBA1C 5.4 08/07/2019      Assessment & Plan:   Problem List Items Addressed This Visit      Cardiovascular and Mediastinum   Essential hypertension    BP Readings from Last 3 Encounters:  08/20/20 134/89  07/15/20 (!) 149/90  05/21/20 (!) 142/98   -BP well controlled today      Relevant Orders   CBC with Differential/Platelet   CMP14+EGFR   Lipid Panel With LDL/HDL Ratio     Other   Hyperlipidemia - Primary    -takes atorvastatin and omega-3 fish oil daily -will check fasting labs       Relevant Orders  Lipid Panel With LDL/HDL Ratio   S/P AVR (aortic valve replacement)    -on xarelto -he states that he bleeds easier now and notices more bruising, but denies GI bleeding or dark/tarry stools       Other Visit Diagnoses    Screening due       Relevant Orders   HCV Ab w/Rflx to Verification   HIV Antibody (routine testing w rflx)      No orders of the defined types were placed in this encounter.   Follow-up: Return in about 6 months (around 02/19/2021) for Physical Exam.    Noreene Larsson, NP

## 2020-08-20 NOTE — Patient Instructions (Signed)
Please have fasting labs drawn this week.  For your appointment in 6 months, please have fasting labs drawn 2-3 days prior to your appointment so we can discuss the results during your office visit.

## 2020-08-20 NOTE — Assessment & Plan Note (Signed)
BP Readings from Last 3 Encounters:  08/20/20 134/89  07/15/20 (!) 149/90  05/21/20 (!) 142/98   -BP well controlled today

## 2020-08-20 NOTE — Assessment & Plan Note (Signed)
-  on xarelto -he states that he bleeds easier now and notices more bruising, but denies GI bleeding or dark/tarry stools

## 2020-08-25 ENCOUNTER — Other Ambulatory Visit: Payer: Self-pay

## 2020-08-25 ENCOUNTER — Encounter: Payer: Self-pay | Admitting: Thoracic Surgery (Cardiothoracic Vascular Surgery)

## 2020-08-25 ENCOUNTER — Telehealth (INDEPENDENT_AMBULATORY_CARE_PROVIDER_SITE_OTHER): Payer: BC Managed Care – PPO | Admitting: Thoracic Surgery (Cardiothoracic Vascular Surgery)

## 2020-08-25 DIAGNOSIS — Z953 Presence of xenogenic heart valve: Secondary | ICD-10-CM

## 2020-08-25 NOTE — Progress Notes (Signed)
[address]       [place] [address]             [phone]     CARDIOTHORACIC SURGERY TELEPHONE VIRTUAL OFFICE NOTE  Primary Cardiologist is Rozann Lesches, MD PCP is Perlie Mayo, NP   HPI:  I spoke with Donald [name] (DOB [birth year] ) via telephone on 08/25/2020 at 4:13 PM and verified that I was speaking with the correct person using more than one form of identification.  We discussed the fact that I was contacting them from my office and they were located at home, as well as the reason(s) for conducting our visit virtually instead of in-person.  The patient expressed understanding the circumstances and agreed to proceed as described.   Patient is a 55 year old male who returns the office today for routine follow-up status post aortic valve replacement using a stented bovine pericardial tissue valve on August 09, 2019 for stage C 2 severe asymptomatic aortic insufficiency associated with moderate to severe global left ventricular systolic dysfunction and significant left ventricular chamber enlargement.    He was last seen here in our office on October 01, 2019.  Follow-up echocardiogram performed at that time revealed normal functioning bioprosthetic tissue valve in the aortic position with significant improvement in left ventricular systolic function.  I spoke with the patient over the telephone today.  He continues to follow-up intermittently with Dr. [name].  He has not had any symptoms of exertional shortness of breath or chest discomfort.  He states that for the most part his blood pressures been under satisfactory control although he states that it has been a little bit elevated recently.  Overall he has no complaints.    Current Outpatient Medications  Medication Sig Dispense Refill  . atorvastatin (LIPITOR) 10 MG tablet Take 10 mg by mouth daily.    . Cholecalciferol (VITAMIN D3) 50 MCG (2000 UT) TABS Take 2,000 Units by mouth daily.    . Coenzyme Q10  (COQ10) 100 MG CAPS Take 100 mg by mouth daily.    . metoprolol succinate (TOPROL-XL) 50 MG 24 hr tablet Take 1 tablet (50 mg total) by mouth daily. Take with or immediately following a meal. 90 tablet 3  . Multiple Vitamin (MULTIVITAMIN WITH MINERALS) TABS tablet Take 1 tablet by mouth daily.    . Omega-3 Fatty Acids (FISH OIL PO) Take 2,000 mg by mouth daily.     . rivaroxaban (XARELTO) 20 MG TABS tablet Take 1 tablet (20 mg total) by mouth daily with supper. 30 tablet 11   No current facility-administered medications for this visit.     Diagnostic Tests:   ECHOCARDIOGRAM REPORT       Patient Name:  Donald [name] Date of Exam: 09/25/2019  Medical Rec #: [id number]   Height:    74.0 in  Accession #:  [id number]  Weight:    191.0 lb  Date of Birth: 1966/01/19  BSA:     2.131 m  Patient Age:  70 years   BP:      127/58 mmHg  Patient Gender: M       HR:      91 bpm.  Exam Location: [facility]   Procedure: 2D Echo, Cardiac Doppler and Color Doppler   Indications:  S/P Aortic Valve replacement Z95.2    History:    Patient has prior history of Echocardiogram examinations,  most         recent 08/09/2019.  Arrythmias:RBBB; Risk  Factors:Hypertension and         Dyslipidemia. Left Ventricular Hypertrophy,Abnormal  EKG,S/P         aortic valve replacement with bioprosthetic valve-29 mm  Edwards         Inspiris Resilia stented bovine pericardial tissue  valve,S/P         patent foramen ovale closure.    Sonographer:  Alvino Chapel RCS  Referring Phys: [id number] [place]    1. Right ventricular systolic function is normal. The right ventricular  size is normal.  2. Anteroseptal hypokinesis consistent with prior cardiac surgery. . Left  ventricular ejection fraction, by estimation, is 45 to 50%. The left  ventricle has mildly decreased function. The left  ventricle demonstrates  regional wall motion abnormalities  (see scoring diagram/findings for description). There is moderate left  ventricular hypertrophy. Left ventricular diastolic parameters are  consistent with Grade I diastolic dysfunction (impaired relaxation).  3. The mitral valve is normal in structure. No evidence of mitral valve  regurgitation. No evidence of mitral stenosis.  4. Edwards Inspiris Resilia stented bovine pericardial tissue valve size  29 mm in AV position. Normal function. . The aortic valve has been  repaired/replaced. Aortic valve regurgitation is not visualized. No aortic  stenosis is present.  5. The inferior vena cava is normal in size with greater than 50%  respiratory variability, suggesting right atrial pressure of 3 mmHg.   FINDINGS  Left Ventricle: Anteroseptal hypokinesis. Left ventricular ejection  fraction, by estimation, is 45 to 50%. The left ventricle has mildly  decreased function. The left ventricle demonstrates regional wall motion  abnormalities. The left ventricular  internal cavity size was normal in size. There is moderate left  ventricular hypertrophy. Left ventricular diastolic parameters are  consistent with Grade I diastolic dysfunction (impaired relaxation).  Normal left ventricular filling pressure.   Right Ventricle: The right ventricular size is normal. No increase in  right ventricular wall thickness. Right ventricular systolic function is  normal.   Left Atrium: Left atrial size was normal in size.   Right Atrium: Right atrial size was normal in size.   Pericardium: There is no evidence of pericardial effusion.   Mitral Valve: The mitral valve is normal in structure. There is mild  thickening of the mitral valve leaflet(s). There is mild calcification of  the mitral valve leaflet(s). Mild mitral annular calcification. No  evidence of mitral valve regurgitation. No  evidence of mitral valve stenosis.   Tricuspid  Valve: The tricuspid valve is normal in structure. Tricuspid  valve regurgitation is not demonstrated. No evidence of tricuspid  stenosis.   Aortic Valve: Edwards Inspiris Resilia stented bovine pericardial tissue  valve size 29 mm in AV position. Normal function. The aortic valve has  been repaired/replaced. Aortic valve regurgitation is not visualized. No  aortic stenosis is present. Aortic  valve mean gradient measures 5.1 mmHg. Aortic valve peak gradient measures  9.2 mmHg. Aortic valve area, by VTI measures 1.71 cm.   Pulmonic Valve: The pulmonic valve was not well visualized. Pulmonic valve  regurgitation is trivial. No evidence of pulmonic stenosis.   Aorta: The aortic root is normal in size and structure.   Venous: The inferior vena cava is normal in size with greater than 50%  respiratory variability, suggesting right atrial pressure of 3 mmHg.   IAS/Shunts: No atrial level shunt detected by color flow Doppler.     LEFT VENTRICLE  PLAX 2D  LVIDd:  5.68 cm   Diastology  LVIDs:     4.86 cm   LV e' lateral:  7.29 cm/s  LV PW:     1.40 cm   LV E/e' lateral: 6.7  LV IVS:    1.40 cm   LV e' medial:  6.53 cm/s  LVOT diam:   2.00 cm   LV E/e' medial: 7.5  LV SV:     40  LV SV Index:  19  LVOT Area:   3.14 cm    LV Volumes (MOD)  LV vol d, MOD A2C: 115.0 ml  LV vol d, MOD A4C: 168.0 ml  LV vol s, MOD A2C: 53.1 ml  LV vol s, MOD A4C: 110.0 ml  LV SV MOD A2C:   61.9 ml  LV SV MOD A4C:   168.0 ml  LV SV MOD BP:   55.7 ml   RIGHT VENTRICLE  RV S prime:   12.10 cm/s  TAPSE (M-mode): 1.6 cm   LEFT ATRIUM       Index    RIGHT ATRIUM      Index  LA diam:    4.20 cm 1.97 cm/m RA Area:   14.90 cm  LA Vol (A2C):  64.1 ml 30.07 ml/m RA Volume:  33.00 ml 15.48 ml/m  LA Vol (A4C):  59.0 ml 27.68 ml/m  LA Biplane Vol: 64.7 ml 30.36 ml/m  AORTIC VALVE  AV Area (Vmax):  1.57 cm  AV Area  (Vmean):  1.53 cm  AV Area (VTI):   1.71 cm  AV Vmax:      152.04 cm/s  AV Vmean:     107.591 cm/s  AV VTI:      0.235 m  AV Peak Grad:   9.2 mmHg  AV Mean Grad:   5.1 mmHg  LVOT Vmax:     75.75 cm/s  LVOT Vmean:    52.550 cm/s  LVOT VTI:     0.128 m  LVOT/AV VTI ratio: 0.54    AORTA  Ao Root diam: 3.90 cm   MITRAL VALVE  MV Area (PHT): 3.24 cm  SHUNTS  MV Decel Time: 234 msec  Systemic VTI: 0.13 m  MV E velocity: 48.70 cm/s Systemic Diam: 2.00 cm  MV A velocity: 75.10 cm/s  MV E/A ratio: 0.65   Carlyle Dolly MD  Electronically signed by Carlyle Dolly MD  Signature Date/Time: 09/25/2019/3:18:58 PM      Impression:  Patient is doing well approximately 1 year status post aortic valve replacement using a stented bovine pericardial tissue valve for severe aortic insufficiency associated with moderate to severe global left ventricular systolic dysfunction.  Follow-up echocardiogram performed last June revealed improved left ventricular function with normal functioning bioprosthetic tissue valve in the aortic position.    Plan:  The patient will continue to follow-up intermittently with Dr. [name].  I explained to the patient that I am leaving [facility] and relocating my practice elsewhere.  Any necessary follow-up in [place] will be referred to one of the remaining partners in the [facility] practice.  All of his questions have been answered.    I discussed limitations of evaluation and management via telephone.  The patient was advised to call back for repeat telephone consultation or to seek an in-person evaluation if questions arise or the patient's clinical condition changes in any significant manner.  I spent in excess of 10 minutes of non-face-to-face time during the conduct of this telephone virtual office consultation, including pre-visit  review of the patient's records and direct  conversation with the patient.      Valentina Gu. Roxy Manns, MD 08/25/2020 4:13 PM

## 2020-08-26 ENCOUNTER — Ambulatory Visit (INDEPENDENT_AMBULATORY_CARE_PROVIDER_SITE_OTHER): Payer: BC Managed Care – PPO | Admitting: Internal Medicine

## 2020-08-26 ENCOUNTER — Other Ambulatory Visit: Payer: Self-pay

## 2020-08-26 ENCOUNTER — Encounter: Payer: Self-pay | Admitting: Internal Medicine

## 2020-08-26 VITALS — BP 148/77 | HR 89 | Resp 18 | Ht 74.0 in | Wt 198.8 lb

## 2020-08-26 DIAGNOSIS — M545 Low back pain, unspecified: Secondary | ICD-10-CM

## 2020-08-26 MED ORDER — PREDNISONE 20 MG PO TABS: 40.0000 mg | ORAL_TABLET | Freq: Every day | ORAL | 0 refills | Status: DC

## 2020-08-26 MED ORDER — CYCLOBENZAPRINE HCL 5 MG PO TABS: 5.0000 mg | ORAL_TABLET | Freq: Three times a day (TID) | ORAL | 1 refills | Status: DC | PRN

## 2020-08-26 NOTE — Progress Notes (Signed)
Acute Office Visit  Subjective:    Patient ID: Donald [name], male    DOB: [birth year], 55 y.o.   MRN: [id number]  Chief Complaint  Patient presents with  . Back Pain    Pt was at work felt something pop in back has sharp pain on the left side also causing cramps happened this am     HPI Patient is in today for evaluation of acute onset back pain that started this morning. He heard a popping sound in the back while walking at his workplace. He has constant, sharp pain, radiating to left LE. He has chronic numbness of the LE, denies any change. Denies any recent injury or weight lifting. Of note, he has h/o spine surgeries for DDD of lumbar spine. He currently denies any saddle anesthesia, urinary or stool incontinence.  Past Medical History:  Diagnosis Date  . Acute gout of left foot 10/03/2019  . Bone tumor 11/22/2013  . Cardiomyopathy ([place])    a. EF 30 to 35% by echo in 06/2019 in the setting of severe AI  . Encounter for screening for malignant neoplasm of colon 06/27/2019  . Glenoid labral tear 10/27/2010  . History of gout 06/27/2019  . Hyperlipidemia   . Patent foramen ovale   . Rotator cuff tear, right 10/27/2010  . S/P aortic valve replacement with bioprosthetic valve 08/09/2019   29 mm Edwards Inspiris Resilia stented bovine pericardial tissue valve  . S/P patent foramen ovale closure 08/09/2019  . Severe aortic insufficiency     Past Surgical History:  Procedure Laterality Date  . AORTIC VALVE REPLACEMENT N/A 08/09/2019   Procedure: AORTIC VALVE REPLACEMENT (AVR) using Margaretha Sheffield Resilia 29 MM Aortic Valve.;  Surgeon: Rexene Alberts, MD;  Location: [facility];  Service: Open Heart Surgery;  Laterality: N/A;  . BACK SURGERY     multiple back surgery  . Benign tumor     Left shin  . BUBBLE STUDY  07/05/2019   Procedure: BUBBLE STUDY;  Surgeon: Satira Sark, MD;  Location: [facility];  Service: Cardiovascular;;  . CARDIAC VALVE REPLACEMENT N/A    Phreesia 02/19/2020  .  HERNIA REPAIR    . REPAIR OF PATENT FORAMEN OVALE N/A 08/09/2019   Procedure: Closure Of Patent Foramen Ovale;  Surgeon: Rexene Alberts, MD;  Location: [facility];  Service: Open Heart Surgery;  Laterality: N/A;  . RIGHT/LEFT HEART CATH AND CORONARY ANGIOGRAPHY N/A 07/26/2019   Procedure: RIGHT/LEFT HEART CATH AND CORONARY ANGIOGRAPHY;  Surgeon: Burnell Blanks, MD;  Location: [facility];  Service: Cardiovascular;  Laterality: N/A;  . SHOULDER SURGERY Left   . SHOULDER SURGERY Right   . TEE WITHOUT CARDIOVERSION N/A 07/05/2019   Procedure: TRANSESOPHAGEAL ECHOCARDIOGRAM (TEE) WITH PROPOFOL;  Surgeon: Satira Sark, MD;  Location: [facility];  Service: Cardiovascular;  Laterality: N/A;  . TEE WITHOUT CARDIOVERSION N/A 08/09/2019   Procedure: TRANSESOPHAGEAL ECHOCARDIOGRAM (TEE);  Surgeon: Rexene Alberts, MD;  Location: [facility];  Service: Open Heart Surgery;  Laterality: N/A;  . Thumb surgery Left x3  . VASECTOMY N/A    Phreesia 02/19/2020    Family History  Problem Relation Age of Onset  . Other Father        Kidney    Social History   Socioeconomic History  . Marital status: Married    Spouse name: [name]   . Number of children: 4  . Years of education: Not on file  . Highest education level: Some college,  no degree  Occupational History  . Occupation: Nurse, adult: [employer]  Tobacco Use  . Smoking status: Former Smoker    Packs/day: 0.25    Types: Cigarettes    Quit date: 08/02/2019    Years since quitting: 1.0  . Smokeless tobacco: Never Used  . Tobacco comment: Cutting down < 5 cig / day  Vaping Use  . Vaping Use: Never used  Substance and Sexual Activity  . Alcohol use: Yes    Alcohol/week: 12.0 standard drinks    Types: 12 Shots of liquor per week    Comment: on Friday and Saturday  . Drug use: No  . Sexual activity: Yes    Birth control/protection: Surgical  Other Topics Concern  . Not on file  Social History Narrative    Live [name] wife of 2 years, previously was widowed from first wife from cancer 01/2011      Four children      Enjoy: golf, target shooting       Diet:    Caffeine: 16-20 oz coffee    Water: 6-7 bottles       Wears seat belt    Smoke detectors at home    Does not use phone while driving    Social Determinants of Health   Financial Resource Strain: Low Risk   . Difficulty of Paying Living Expenses: Not hard at all  Food Insecurity: No Food Insecurity  . Worried About Charity fundraiser in the Last Year: Never true  . Ran Out of Food in the Last Year: Never true  Transportation Needs: No Transportation Needs  . Lack of Transportation (Medical): No  . Lack of Transportation (Non-Medical): No  Physical Activity: Sufficiently Active  . Days of Exercise per Week: 5 days  . Minutes of Exercise per Session: 50 min  Stress: No Stress Concern Present  . Feeling of Stress : Not at all  Social Connections: Moderately Isolated  . Frequency of Communication with Friends and Family: More than three times a week  . Frequency of Social Gatherings with Friends and Family: Once a week  . Attends Religious Services: Never  . Active Member of Clubs or Organizations: No  . Attends Archivist Meetings: Never  . Marital Status: Married  Human resources officer Violence: Not At Risk  . Fear of Current or Ex-Partner: No  . Emotionally Abused: No  . Physically Abused: No  . Sexually Abused: No    Outpatient Medications Prior to Visit  Medication Sig Dispense Refill  . atorvastatin (LIPITOR) 10 MG tablet Take 10 mg by mouth daily.    . Cholecalciferol (VITAMIN D3) 50 MCG (2000 UT) TABS Take 2,000 Units by mouth daily.    . Coenzyme Q10 (COQ10) 100 MG CAPS Take 100 mg by mouth daily.    . Multiple Vitamin (MULTIVITAMIN WITH MINERALS) TABS tablet Take 1 tablet by mouth daily.    . Omega-3 Fatty Acids (FISH OIL PO) Take 2,000 mg by mouth daily.     . rivaroxaban (XARELTO) 20 MG TABS tablet Take  1 tablet (20 mg total) by mouth daily with supper. 30 tablet 11  . metoprolol succinate (TOPROL-XL) 50 MG 24 hr tablet Take 1 tablet (50 mg total) by mouth daily. Take with or immediately following a meal. 90 tablet 3   No facility-administered medications prior to visit.    Allergies  Allergen Reactions  . Ultram [Tramadol Hcl] Itching    "Tunnel vision" warm feeling  all over and wanted to jump out of his skin    Review of Systems  Constitutional: Negative for chills and fever.  Cardiovascular: Negative for chest pain and palpitations.  Gastrointestinal: Negative for constipation and diarrhea.  Genitourinary: Negative for dysuria and hematuria.  Musculoskeletal: Positive for back pain and gait problem.  Neurological: Negative for dizziness and weakness.       Objective:    Physical Exam Vitals reviewed.  Constitutional:      General: He is not in acute distress.    Appearance: He is not diaphoretic.  HENT:     Head: Normocephalic and atraumatic.     Nose: Nose normal.     Mouth/Throat:     Mouth: Mucous membranes are moist.  Eyes:     General: No scleral icterus.    Extraocular Movements: Extraocular movements intact.  Cardiovascular:     Rate and Rhythm: Normal rate and regular rhythm.     Pulses: Normal pulses.     Heart sounds: Normal heart sounds. No murmur heard.   Pulmonary:     Breath sounds: Normal breath sounds. No wheezing or rales.  Musculoskeletal:        General: Tenderness (Lumbar spine area) present.     Cervical back: Neck supple. No tenderness.     Right lower leg: No edema.     Left lower leg: No edema.  Skin:    General: Skin is warm.     Findings: No rash.  Neurological:     General: No focal deficit present.     Mental Status: He is alert and oriented to person, place, and time.  Psychiatric:        Mood and Affect: Mood normal.        Behavior: Behavior normal.     BP (!) 148/77 (BP Location: Right Arm, Patient Position: Sitting,  Cuff Size: Normal)   Pulse 89   Resp 18   Ht 6\' 2"  (1.88 m)   Wt 198 lb 12.8 oz (90.2 kg)   SpO2 97%   BMI 25.52 kg/m  Wt Readings from Last 3 Encounters:  08/26/20 198 lb 12.8 oz (90.2 kg)  08/20/20 202 lb (91.6 kg)  07/15/20 202 lb (91.6 kg)    Health Maintenance Due  Topic Date Due  . Hepatitis C Screening  Never done  . HIV Screening  Never done  . COLONOSCOPY (Pts 45-4yrs Insurance coverage will need to be confirmed)  Never done    There are no preventive care reminders to display for this patient.   No results found for: TSH Lab Results  Component Value Date   WBC 4.5 05/21/2020   HGB 16.3 05/21/2020   HCT 49.0 05/21/2020   MCV 98.0 05/21/2020   PLT 177 05/21/2020   Lab Results  Component Value Date   NA 138 05/21/2020   K 5.1 05/21/2020   CO2 28 05/21/2020   GLUCOSE 105 (H) 05/21/2020   BUN 17 05/21/2020   CREATININE 0.71 05/21/2020   BILITOT 0.6 10/03/2019   ALKPHOS 57 08/12/2019   AST 14 10/03/2019   ALT 17 10/03/2019   PROT 6.8 10/03/2019   ALBUMIN 2.8 (L) 08/12/2019   CALCIUM 9.4 05/21/2020   ANIONGAP 6 05/21/2020   Lab Results  Component Value Date   CHOL 174 06/28/2019   Lab Results  Component Value Date   HDL 53 06/28/2019   Lab Results  Component Value Date   LDLCALC 76 06/28/2019   Lab Results  Component  Value Date   TRIG 375 (H) 06/28/2019   Lab Results  Component Value Date   CHOLHDL 3.3 06/28/2019   Lab Results  Component Value Date   HGBA1C 5.4 08/07/2019       Assessment & Plan:   Problem List Items Addressed This Visit   None   Visit Diagnoses    Acute left-sided low back pain without sciatica    -  Primary Likely muscle sprain and/or chronic DDD lumbar spine Prednisone for 5 days, would avoid NSAIDs due to cardiac history Flexeril PRN Avoid heavy lifting Rest, heating pad/ice pack Educated about warning signs of back pain and contact if persistent pain Work note provided   Relevant Medications    predniSONE (DELTASONE) 20 MG tablet   cyclobenzaprine (FLEXERIL) 5 MG tablet       Meds ordered this encounter  Medications  . predniSONE (DELTASONE) 20 MG tablet    Sig: Take 2 tablets (40 mg total) by mouth daily with breakfast.    Dispense:  10 tablet    Refill:  0  . cyclobenzaprine (FLEXERIL) 5 MG tablet    Sig: Take 1 tablet (5 mg total) by mouth 3 (three) times daily as needed for muscle spasms.    Dispense:  30 tablet    Refill:  1     Kemet Nijjar Keith Rake, MD

## 2020-08-26 NOTE — Patient Instructions (Addendum)
Please start taking Prednisone for inflammation.  Please start taking Flexeril as needed for muscle spasms.   Acute Back Pain, Adult Acute back pain is sudden and usually short-lived. It is often caused by an injury to the muscles and tissues in the back. The injury may result from:  A muscle or ligament getting overstretched or torn (strained). Ligaments are tissues that connect bones to each other. Lifting something improperly can cause a back strain.  Wear and tear (degeneration) of the spinal disks. Spinal disks are circular tissue that provide cushioning between the bones of the spine (vertebrae).  Twisting motions, such as while playing sports or doing yard work.  A hit to the back.  Arthritis. You may have a physical exam, lab tests, and imaging tests to find the cause of your pain. Acute back pain usually goes away with rest and home care. Follow these instructions at home: Managing pain, stiffness, and swelling  Treatment may include medicines for pain and inflammation that are taken by mouth or applied to the skin, prescription pain medicine, or muscle relaxants. Take over-the-counter and prescription medicines only as told by your health care provider.  Your health care provider may recommend applying ice during the first 24-48 hours after your pain starts. To do this: ? Put ice in a plastic bag. ? Place a towel between your skin and the bag. ? Leave the ice on for 20 minutes, 2-3 times a day.  If directed, apply heat to the affected area as often as told by your health care provider. Use the heat source that your health care provider recommends, such as a moist heat pack or a heating pad. ? Place a towel between your skin and the heat source. ? Leave the heat on for 20-30 minutes. ? Remove the heat if your skin turns bright red. This is especially important if you are unable to feel pain, heat, or cold. You have a greater risk of getting burned. Activity  Do not stay in  bed. Staying in bed for more than 1-2 days can delay your recovery.  Sit up and stand up straight. Avoid leaning forward when you sit or hunching over when you stand. ? If you work at a desk, sit close to it so you do not need to lean over. Keep your chin tucked in. Keep your neck drawn back, and keep your elbows bent at a 90-degree angle (right angle). ? Sit high and close to the steering wheel when you drive. Add lower back (lumbar) support to your car seat, if needed.  Take short walks on even surfaces as soon as you are able. Try to increase the length of time you walk each day.  Do not sit, drive, or stand in one place for more than 30 minutes at a time. Sitting or standing for long periods of time can put stress on your back.  Do not drive or use heavy machinery while taking prescription pain medicine.  Use proper lifting techniques. When you bend and lift, use positions that put less stress on your back: ? Griffith your knees. ? Keep the load close to your body. ? Avoid twisting.  Exercise regularly as told by your health care provider. Exercising helps your back heal faster and helps prevent back injuries by keeping muscles strong and flexible.  Work with a physical therapist to make a safe exercise program, as recommended by your health care provider. Do any exercises as told by your physical therapist.   Lifestyle  Maintain a healthy weight. Extra weight puts stress on your back and makes it difficult to have good posture.  Avoid activities or situations that make you feel anxious or stressed. Stress and anxiety increase muscle tension and can make back pain worse. Learn ways to manage anxiety and stress, such as through exercise. General instructions  Sleep on a firm mattress in a comfortable position. Try lying on your side with your knees slightly bent. If you lie on your back, put a pillow under your knees.  Follow your treatment plan as told by your health care provider. This  may include: ? Cognitive or behavioral therapy. ? Acupuncture or massage therapy. ? Meditation or yoga. Contact a health care provider if:  You have pain that is not relieved with rest or medicine.  You have increasing pain going down into your legs or buttocks.  Your pain does not improve after 2 weeks.  You have pain at night.  You lose weight without trying.  You have a fever or chills. Get help right away if:  You develop new bowel or bladder control problems.  You have unusual weakness or numbness in your arms or legs.  You develop nausea or vomiting.  You develop abdominal pain.  You feel faint. Summary  Acute back pain is sudden and usually short-lived.  Use proper lifting techniques. When you bend and lift, use positions that put less stress on your back.  Take over-the-counter and prescription medicines and apply heat or ice as directed by your health care provider. This information is not intended to replace advice given to you by your health care provider. Make sure you discuss any questions you have with your health care provider. Document Revised: 01/04/2020 Document Reviewed: 01/04/2020 Elsevier Patient Education  2021 [employer].

## 2020-09-09 ENCOUNTER — Encounter: Payer: Self-pay | Admitting: Internal Medicine

## 2020-09-09 ENCOUNTER — Ambulatory Visit (INDEPENDENT_AMBULATORY_CARE_PROVIDER_SITE_OTHER): Payer: BC Managed Care – PPO | Admitting: Internal Medicine

## 2020-09-09 ENCOUNTER — Other Ambulatory Visit: Payer: Self-pay

## 2020-09-09 VITALS — BP 135/84 | HR 93 | Temp 98.6°F | Resp 18 | Ht 74.0 in | Wt 200.8 lb

## 2020-09-09 DIAGNOSIS — M109 Gout, unspecified: Secondary | ICD-10-CM

## 2020-09-09 MED ORDER — ALLOPURINOL 100 MG PO TABS: 100.0000 mg | ORAL_TABLET | Freq: Every day | ORAL | 6 refills | Status: DC

## 2020-09-09 MED ORDER — COLCHICINE 0.6 MG PO TABS: 0.6000 mg | ORAL_TABLET | Freq: Every day | ORAL | 0 refills | Status: DC

## 2020-09-09 NOTE — Patient Instructions (Signed)
Gout  Gout is painful swelling of your joints. Gout is a type of arthritis. It is caused by having too much uric acid in your body. Uric acid is a chemical that is made when your body breaks down substances called purines. If your body has too much uric acid, sharp crystals can form and build up in your joints. This causes pain and swelling. Gout attacks can happen quickly and be very painful (acute gout). Over time, the attacks can affect more joints and happen more often (chronic gout). What are the causes?  Too much uric acid in your blood. This can happen because: ? Your kidneys do not remove enough uric acid from your blood. ? Your body makes too much uric acid. ? You eat too many foods that are high in purines. These foods include organ meats, some seafood, and beer.  Trauma or stress. What increases the risk?  Having a family history of gout.  Being male and middle-aged.  Being male and having gone through menopause.  Being very overweight (obese).  Drinking alcohol, especially beer.  Not having enough water in the body (being dehydrated).  Losing weight too quickly.  Having an organ transplant.  Having lead poisoning.  Taking certain medicines.  Having kidney disease.  Having a skin condition called psoriasis. What are the signs or symptoms? An attack of acute gout usually happens in just one joint. The most common place is the big toe. Attacks often start at night. Other joints that may be affected include joints of the feet, ankle, knee, fingers, wrist, or elbow. Symptoms of an attack may include:  Very bad pain.  Warmth.  Swelling.  Stiffness.  Shiny, red, or purple skin.  Tenderness. The affected joint may be very painful to touch.  Chills and fever. Chronic gout may cause symptoms more often. More joints may be involved. You may also have white or yellow lumps (tophi) on your hands or feet or in other areas near your joints.   How is this  treated?  Treatment for this condition has two phases: treating an acute attack and preventing future attacks.  Acute gout treatment may include: ? NSAIDs. ? Steroids. These are taken by mouth or injected into a joint. ? Colchicine. This medicine relieves pain and swelling. It can be given by mouth or through an IV tube.  Preventive treatment may include: ? Taking small doses of NSAIDs or colchicine daily. ? Using a medicine that reduces uric acid levels in your blood. ? Making changes to your diet. You may need to see a food expert (dietitian) about what to eat and drink to prevent gout. Follow these instructions at home: During a gout attack  If told, put ice on the painful area: ? Put ice in a plastic bag. ? Place a towel between your skin and the bag. ? Leave the ice on for 20 minutes, 2-3 times a day.  Raise (elevate) the painful joint above the level of your heart as often as you can.  Rest the joint as much as possible. If the joint is in your leg, you may be given crutches.  Follow instructions from your doctor about what you cannot eat or drink.   Avoiding future gout attacks  Eat a low-purine diet. Avoid foods and drinks such as: ? Liver. ? Kidney. ? Anchovies. ? Asparagus. ? Herring. ? Mushrooms. ? Mussels. ? Beer.  Stay at a healthy weight. If you want to lose weight, talk with your doctor. Do   not lose weight too fast.  Start or continue an exercise plan as told by your doctor. Eating and drinking  Drink enough fluids to keep your pee (urine) pale yellow.  If you drink alcohol: ? Limit how much you use to:  0-1 drink a day for women.  0-2 drinks a day for men. ? Be aware of how much alcohol is in your drink. In the [place] one drink equals one 12 oz bottle of beer (355 mL), one 5 oz glass of wine (148 mL), or one 1 oz glass of hard liquor (44 mL). General instructions  Take over-the-counter and prescription medicines only as told by your doctor.  Do  not drive or use heavy machinery while taking prescription pain medicine.  Return to your normal activities as told by your doctor. Ask your doctor what activities are safe for you.  Keep all follow-up visits as told by your doctor. This is important. Contact a doctor if:  You have another gout attack.  You still have symptoms of a gout attack after 10 days of treatment.  You have problems (side effects) because of your medicines.  You have chills or a fever.  You have burning pain when you pee (urinate).  You have pain in your lower back or belly. Get help right away if:  You have very bad pain.  Your pain cannot be controlled.  You cannot pee. Summary  Gout is painful swelling of the joints.  The most common site of pain is the big toe, but it can affect other joints.  Medicines and avoiding some foods can help to prevent and treat gout attacks. This information is not intended to replace advice given to you by your health care provider. Make sure you discuss any questions you have with your health care provider. Document Revised: 11/02/2017 Document Reviewed: 11/02/2017 Elsevier Patient Education  2021 [employer]  

## 2020-09-09 NOTE — Progress Notes (Signed)
Acute Office Visit  Subjective:    Patient ID: Donald [name], male    DOB: November 02, 1965, 55 y.o.   MRN: [id number]  Chief Complaint  Patient presents with  . Ankle Pain    Right ankle pain started at lunch yesterday    HPI Patient is in today for evaluation of right ankle swelling and redness that started yesterday. He denies any injury. He was at work around afternoon time, and noticed to have pain and swelling. He applied ice to the area, which has improved his ankle pain from 8 to 3 today. Of note, he had 4 beers on the night prior. He has h/o gout and is not on any maintenance treatment currently.  Past Medical History:  Diagnosis Date  . Acute gout of left foot 10/03/2019  . Bone tumor 11/22/2013  . Cardiomyopathy ([place])    a. EF 30 to 35% by echo in 06/2019 in the setting of severe AI  . Encounter for screening for malignant neoplasm of colon 06/27/2019  . Glenoid labral tear 10/27/2010  . History of gout 06/27/2019  . Hyperlipidemia   . Patent foramen ovale   . Rotator cuff tear, right 10/27/2010  . S/P aortic valve replacement with bioprosthetic valve 08/09/2019   29 mm Edwards Inspiris Resilia stented bovine pericardial tissue valve  . S/P patent foramen ovale closure 08/09/2019  . Severe aortic insufficiency     Past Surgical History:  Procedure Laterality Date  . AORTIC VALVE REPLACEMENT N/A 08/09/2019   Procedure: AORTIC VALVE REPLACEMENT (AVR) using Margaretha Sheffield Resilia 29 MM Aortic Valve.;  Surgeon: Rexene Alberts, MD;  Location: [facility];  Service: Open Heart Surgery;  Laterality: N/A;  . BACK SURGERY     multiple back surgery  . Benign tumor     Left shin  . BUBBLE STUDY  07/05/2019   Procedure: BUBBLE STUDY;  Surgeon: Satira Sark, MD;  Location: [facility];  Service: Cardiovascular;;  . CARDIAC VALVE REPLACEMENT N/A    Phreesia 02/19/2020  . HERNIA REPAIR    . REPAIR OF PATENT FORAMEN OVALE N/A 08/09/2019   Procedure: Closure Of Patent Foramen Ovale;  Surgeon:  Rexene Alberts, MD;  Location: [facility];  Service: Open Heart Surgery;  Laterality: N/A;  . RIGHT/LEFT HEART CATH AND CORONARY ANGIOGRAPHY N/A 07/26/2019   Procedure: RIGHT/LEFT HEART CATH AND CORONARY ANGIOGRAPHY;  Surgeon: Burnell Blanks, MD;  Location: [facility];  Service: Cardiovascular;  Laterality: N/A;  . SHOULDER SURGERY Left   . SHOULDER SURGERY Right   . TEE WITHOUT CARDIOVERSION N/A 07/05/2019   Procedure: TRANSESOPHAGEAL ECHOCARDIOGRAM (TEE) WITH PROPOFOL;  Surgeon: Satira Sark, MD;  Location: [facility];  Service: Cardiovascular;  Laterality: N/A;  . TEE WITHOUT CARDIOVERSION N/A 08/09/2019   Procedure: TRANSESOPHAGEAL ECHOCARDIOGRAM (TEE);  Surgeon: Rexene Alberts, MD;  Location: [facility];  Service: Open Heart Surgery;  Laterality: N/A;  . Thumb surgery Left x3  . VASECTOMY N/A    Phreesia 02/19/2020    Family History  Problem Relation Age of Onset  . Other Father        Kidney    Social History   Socioeconomic History  . Marital status: Married    Spouse name: [name]   . Number of children: 4  . Years of education: Not on file  . Highest education level: Some college, no degree  Occupational History  . Occupation: Nurse, adult: [employer]  Tobacco Use  .  Smoking status: Former Smoker    Packs/day: 0.25    Types: Cigarettes    Quit date: 08/02/2019    Years since quitting: 1.1  . Smokeless tobacco: Never Used  . Tobacco comment: Cutting down < 5 cig / day  Vaping Use  . Vaping Use: Never used  Substance and Sexual Activity  . Alcohol use: Yes    Alcohol/week: 12.0 standard drinks    Types: 12 Shots of liquor per week    Comment: on Friday and Saturday  . Drug use: No  . Sexual activity: Yes    Birth control/protection: Surgical  Other Topics Concern  . Not on file  Social History Narrative   Live [name] wife of 2 years, previously was widowed from first wife from cancer 01/2011      Four children      Enjoy:  golf, target shooting       Diet:    Caffeine: 16-20 oz coffee    Water: 6-7 bottles       Wears seat belt    Smoke detectors at home    Does not use phone while driving    Social Determinants of Health   Financial Resource Strain: Low Risk   . Difficulty of Paying Living Expenses: Not hard at all  Food Insecurity: No Food Insecurity  . Worried About Charity fundraiser in the Last Year: Never true  . Ran Out of Food in the Last Year: Never true  Transportation Needs: No Transportation Needs  . Lack of Transportation (Medical): No  . Lack of Transportation (Non-Medical): No  Physical Activity: Sufficiently Active  . Days of Exercise per Week: 5 days  . Minutes of Exercise per Session: 50 min  Stress: No Stress Concern Present  . Feeling of Stress : Not at all  Social Connections: Moderately Isolated  . Frequency of Communication with Friends and Family: More than three times a week  . Frequency of Social Gatherings with Friends and Family: Once a week  . Attends Religious Services: Never  . Active Member of Clubs or Organizations: No  . Attends Archivist Meetings: Never  . Marital Status: Married  Human resources officer Violence: Not At Risk  . Fear of Current or Ex-Partner: No  . Emotionally Abused: No  . Physically Abused: No  . Sexually Abused: No    Outpatient Medications Prior to Visit  Medication Sig Dispense Refill  . atorvastatin (LIPITOR) 10 MG tablet Take 10 mg by mouth daily.    . Cholecalciferol (VITAMIN D3) 50 MCG (2000 UT) TABS Take 2,000 Units by mouth daily.    . Coenzyme Q10 (COQ10) 100 MG CAPS Take 100 mg by mouth daily.    . cyclobenzaprine (FLEXERIL) 5 MG tablet Take 1 tablet (5 mg total) by mouth 3 (three) times daily as needed for muscle spasms. 30 tablet 1  . Multiple Vitamin (MULTIVITAMIN WITH MINERALS) TABS tablet Take 1 tablet by mouth daily.    . Omega-3 Fatty Acids (FISH OIL PO) Take 2,000 mg by mouth daily.     . rivaroxaban (XARELTO)  20 MG TABS tablet Take 1 tablet (20 mg total) by mouth daily with supper. 30 tablet 11  . predniSONE (DELTASONE) 20 MG tablet Take 2 tablets (40 mg total) by mouth daily with breakfast. 10 tablet 0  . metoprolol succinate (TOPROL-XL) 50 MG 24 hr tablet Take 1 tablet (50 mg total) by mouth daily. Take with or immediately following a meal. 90 tablet 3  No facility-administered medications prior to visit.    Allergies  Allergen Reactions  . Ultram [Tramadol Hcl] Itching    "Tunnel vision" warm feeling all over and wanted to jump out of his skin    Review of Systems  Constitutional: Negative for chills and fever.  Respiratory: Negative for cough and shortness of breath.   Cardiovascular: Negative for chest pain and palpitations.  Musculoskeletal: Positive for arthralgias (Right ankle) and joint swelling.  Neurological: Negative for weakness and numbness.       Objective:    Physical Exam Vitals reviewed.  Constitutional:      General: He is not in acute distress.    Appearance: He is not diaphoretic.  HENT:     Head: Normocephalic and atraumatic.     Nose: Nose normal.     Mouth/Throat:     Mouth: Mucous membranes are moist.  Eyes:     General: No scleral icterus.    Extraocular Movements: Extraocular movements intact.  Cardiovascular:     Rate and Rhythm: Normal rate and regular rhythm.     Pulses: Normal pulses.     Heart sounds: No murmur heard.   Musculoskeletal:        General: Swelling (Right ankle, more over lateral side, slightly tender and erythematous) present.     Cervical back: Neck supple. No tenderness.     Left lower leg: No edema.  Skin:    General: Skin is warm.     Findings: No rash.  Neurological:     General: No focal deficit present.     Mental Status: He is alert and oriented to person, place, and time.  Psychiatric:        Mood and Affect: Mood normal.        Behavior: Behavior normal.     BP 135/84 (BP Location: Right Arm, Patient  Position: Sitting, Cuff Size: Normal)   Pulse 93   Temp 98.6 F (37 C) (Oral)   Resp 18   Ht 6\' 2"  (1.88 m)   Wt 200 lb 12.8 oz (91.1 kg)   SpO2 98%   BMI 25.78 kg/m  Wt Readings from Last 3 Encounters:  09/09/20 200 lb 12.8 oz (91.1 kg)  08/26/20 198 lb 12.8 oz (90.2 kg)  08/20/20 202 lb (91.6 kg)    Health Maintenance Due  Topic Date Due  . HIV Screening  Never done  . Hepatitis C Screening  Never done  . COLONOSCOPY (Pts 45-59yrs Insurance coverage will need to be confirmed)  Never done    There are no preventive care reminders to display for this patient.   No results found for: TSH Lab Results  Component Value Date   WBC 4.5 05/21/2020   HGB 16.3 05/21/2020   HCT 49.0 05/21/2020   MCV 98.0 05/21/2020   PLT 177 05/21/2020   Lab Results  Component Value Date   NA 138 05/21/2020   K 5.1 05/21/2020   CO2 28 05/21/2020   GLUCOSE 105 (H) 05/21/2020   BUN 17 05/21/2020   CREATININE 0.71 05/21/2020   BILITOT 0.6 10/03/2019   ALKPHOS 57 08/12/2019   AST 14 10/03/2019   ALT 17 10/03/2019   PROT 6.8 10/03/2019   ALBUMIN 2.8 (L) 08/12/2019   CALCIUM 9.4 05/21/2020   ANIONGAP 6 05/21/2020   Lab Results  Component Value Date   CHOL 174 06/28/2019   Lab Results  Component Value Date   HDL 53 06/28/2019   Lab Results  Component Value  Date   LDLCALC 76 06/28/2019   Lab Results  Component Value Date   TRIG 375 (H) 06/28/2019   Lab Results  Component Value Date   CHOLHDL 3.3 06/28/2019   Lab Results  Component Value Date   HGBA1C 5.4 08/07/2019       Assessment & Plan:   Problem List Items Addressed This Visit   None   Visit Diagnoses    Gout of right ankle, unspecified cause, unspecified chronicity    -  Primary Appears to be gout flare as nontraumatic swelling Uric acid level was elevated in the past - will need Uric acid level checked when gout flare resolves Colchicine for acute flare Not on any maintenance treatment, start Allopurinol  once acute flare resolves   Relevant Medications   colchicine 0.6 MG tablet   allopurinol (ZYLOPRIM) 100 MG tablet   Other Relevant Orders   Uric acid       Meds ordered this encounter  Medications  . colchicine 0.6 MG tablet    Sig: Take 1 tablet (0.6 mg total) by mouth daily.    Dispense:  10 tablet    Refill:  0  . allopurinol (ZYLOPRIM) 100 MG tablet    Sig: Take 1 tablet (100 mg total) by mouth daily.    Dispense:  30 tablet    Refill:  6     Kory Panjwani Keith Rake, MD

## 2020-10-07 NOTE — Therapy (Signed)
[place], [place], [address] Phone: [phone]   Fax:  [phone]  Patient Details  Name: Donald [name] MRN: [id number] Date of Birth: Dec 03, 1965 Referring Provider:  No ref. provider found  Encounter Date: 06/25/2020  Entered in [place] 10/07/2020, 3:33 PM  [place], [place], [address] Phone: [phone]   Fax:  [phone]

## 2020-10-07 NOTE — Therapy (Signed)
[place], [place], [address] Phone: [phone]   Fax:  [phone]  Patient Details  Name: CLEVER GERALDO MRN: [id number] Date of Birth: 02-01-1966 Referring Provider:  No ref. provider found  Encounter Date: 06/25/2020  Entered in error  Toniann Fail 10/07/2020, 3:35 PM  [place], [place], [address] Phone: [phone]   Fax:  [phone]

## 2020-10-10 ENCOUNTER — Encounter: Payer: Self-pay | Admitting: Family Medicine

## 2020-10-13 ENCOUNTER — Other Ambulatory Visit: Payer: Self-pay | Admitting: *Deleted

## 2020-10-13 MED ORDER — ATORVASTATIN CALCIUM 10 MG PO TABS: 10.0000 mg | ORAL_TABLET | Freq: Every day | ORAL | 0 refills | Status: DC

## 2020-11-07 ENCOUNTER — Other Ambulatory Visit: Payer: Self-pay

## 2020-11-07 ENCOUNTER — Ambulatory Visit (HOSPITAL_COMMUNITY)
Admission: RE | Admit: 2020-11-07 | Discharge: 2020-11-07 | Disposition: A | Discharge status: Home / Self Care | Payer: BC Managed Care – PPO | Source: Ambulatory Visit | Attending: Cardiology | Admitting: Cardiology

## 2020-11-07 DIAGNOSIS — Z953 Presence of xenogenic heart valve: Secondary | ICD-10-CM

## 2020-11-07 LAB — ECHOCARDIOGRAM COMPLETE
AR max vel: 1.88 cm2
AV Area VTI: 1.72 cm2
AV Area mean vel: 1.99 cm2
AV Mean grad: 6 mmHg
AV Peak grad: 10.8 mmHg
Ao pk vel: 1.64 m/s
Area-P 1/2: 3.95 cm2
S' Lateral: 3.7 cm

## 2020-11-07 NOTE — Progress Notes (Signed)
*  PRELIMINARY RESULTS* Echocardiogram 2D Echocardiogram has been performed.  Donald [name] 11/07/2020, 9:10 AM

## 2020-11-27 ENCOUNTER — Encounter: Payer: Self-pay | Admitting: Nurse Practitioner

## 2020-11-27 ENCOUNTER — Other Ambulatory Visit: Payer: Self-pay | Admitting: *Deleted

## 2020-11-28 ENCOUNTER — Other Ambulatory Visit: Payer: Self-pay

## 2020-11-28 ENCOUNTER — Ambulatory Visit (INDEPENDENT_AMBULATORY_CARE_PROVIDER_SITE_OTHER): Payer: BC Managed Care – PPO | Admitting: Internal Medicine

## 2020-11-28 ENCOUNTER — Encounter: Payer: Self-pay | Admitting: Internal Medicine

## 2020-11-28 VITALS — BP 136/75 | HR 86 | Resp 18 | Ht 74.0 in | Wt 201.1 lb

## 2020-11-28 DIAGNOSIS — B360 Pityriasis versicolor: Secondary | ICD-10-CM

## 2020-11-28 DIAGNOSIS — M109 Gout, unspecified: Secondary | ICD-10-CM | POA: Diagnosis not present

## 2020-11-28 MED ORDER — KETOCONAZOLE 2 % EX CREA: 1.0000 "application " | TOPICAL_CREAM | Freq: Every day | CUTANEOUS | 0 refills | Status: DC

## 2020-11-28 MED ORDER — COLCHICINE 0.6 MG PO TABS: ORAL_TABLET | ORAL | 0 refills | Status: DC

## 2020-11-28 NOTE — Progress Notes (Signed)
Acute Office Visit  Subjective:    Patient ID: Donald [name], male    DOB: 01-21-1966, 55 y.o.   MRN: [id number]  Chief Complaint  Patient presents with   Acute Visit    Gout flare up left foot started 11-26-20 has had this happen before has cut out all red meat and alcohol     HPI Patient is in today for evaluation of gout flare for about a week. He had missed doses of Allopurinol for last 1 week. Denies any alchol intake. Has cut down red meat intake as well.  He also reports a rash over back, which are circular and erythematous patches. Has been present for a long time. Denies any itching currently.  Past Medical History:  Diagnosis Date   Acute gout of left foot 10/03/2019   Bone tumor 11/22/2013   Cardiomyopathy ([place])    a. EF 30 to 35% by echo in 06/2019 in the setting of severe AI   COVID-19 04/16/2020   Encounter for screening for malignant neoplasm of colon 06/27/2019   Glenoid labral tear 10/27/2010   History of gout 06/27/2019   Hyperlipidemia    Patent foramen ovale    Rotator cuff tear, right 10/27/2010   S/P aortic valve replacement with bioprosthetic valve 08/09/2019   29 mm Edwards Inspiris Resilia stented bovine pericardial tissue valve   S/P patent foramen ovale closure 08/09/2019   Severe aortic insufficiency     Past Surgical History:  Procedure Laterality Date   AORTIC VALVE REPLACEMENT N/A 08/09/2019   Procedure: AORTIC VALVE REPLACEMENT (AVR) using Margaretha Sheffield Resilia 29 MM Aortic Valve.;  Surgeon: Rexene Alberts, MD;  Location: [facility];  Service: Open Heart Surgery;  Laterality: N/A;   BACK SURGERY     multiple back surgery   Benign tumor     Left shin   BUBBLE STUDY  07/05/2019   Procedure: BUBBLE STUDY;  Surgeon: Satira Sark, MD;  Location: [facility];  Service: Cardiovascular;;   CARDIAC VALVE REPLACEMENT N/A    Phreesia 02/19/2020   HERNIA REPAIR     REPAIR OF PATENT FORAMEN OVALE N/A 08/09/2019   Procedure: Closure Of Patent Foramen Ovale;   Surgeon: Rexene Alberts, MD;  Location: [facility];  Service: Open Heart Surgery;  Laterality: N/A;   RIGHT/LEFT HEART CATH AND CORONARY ANGIOGRAPHY N/A 07/26/2019   Procedure: RIGHT/LEFT HEART CATH AND CORONARY ANGIOGRAPHY;  Surgeon: Burnell Blanks, MD;  Location: [facility];  Service: Cardiovascular;  Laterality: N/A;   SHOULDER SURGERY Left    SHOULDER SURGERY Right    TEE WITHOUT CARDIOVERSION N/A 07/05/2019   Procedure: TRANSESOPHAGEAL ECHOCARDIOGRAM (TEE) WITH PROPOFOL;  Surgeon: Satira Sark, MD;  Location[facility];  Service: Cardiovascular;  Laterality: N/A;   TEE WITHOUT CARDIOVERSION N/A 08/09/2019   Procedure: TRANSESOPHAGEAL ECHOCARDIOGRAM (TEE);  Surgeon: Rexene Alberts, MD;  Location: [facility];  Service: Open Heart Surgery;  Laterality: N/A;   Thumb surgery Left x3   VASECTOMY N/A    Phreesia 02/19/2020    Family History  Problem Relation Age of Onset   Other Father        Kidney    Social History   Socioeconomic History   Marital status: Married    Spouse name: [name]    Number of children: 4   Years of education: Not on file   Highest education level: Some college, no degree  Occupational History   Occupation: Nurse, adult:  [employer]  Tobacco Use   Smoking status: Former    Packs/day: 0.25    Types: Cigarettes    Quit date: 08/02/2019    Years since quitting: 1.3   Smokeless tobacco: Never   Tobacco comments:    Cutting down < 5 cig / day  Vaping Use   Vaping Use: Never used  Substance and Sexual Activity   Alcohol use: Yes    Alcohol/week: 12.0 standard drinks    Types: 12 Shots of liquor per week    Comment: on Friday and Saturday   Drug use: No   Sexual activity: Yes    Birth control/protection: Surgical  Other Topics Concern   Not on file  Social History Narrative   Live [name] wife of 2 years, previously was widowed from first wife from cancer 01/2011      Four children      Enjoy: golf, target shooting        Diet:    Caffeine: 16-20 oz coffee    Water: 6-7 bottles       Wears seat belt    Smoke detectors at home    Does not use phone while driving    Social Determinants of Health   Financial Resource Strain: Low Risk    Difficulty of Paying Living Expenses: Not hard at all  Food Insecurity: No Food Insecurity   Worried About Charity fundraiser in the Last Year: Never true   Arboriculturist in the Last Year: Never true  Transportation Needs: No Transportation Needs   Lack of Transportation (Medical): No   Lack of Transportation (Non-Medical): No  Physical Activity: Sufficiently Active   Days of Exercise per Week: 5 days   Minutes of Exercise per Session: 50 min  Stress: No Stress Concern Present   Feeling of Stress : Not at all  Social Connections: Moderately Isolated   Frequency of Communication with Friends and Family: More than three times a week   Frequency of Social Gatherings with Friends and Family: Once a week   Attends Religious Services: Never   Marine scientist or Organizations: No   Attends Music therapist: Never   Marital Status: Married  Human resources officer Violence: Not At Risk   Fear of Current or Ex-Partner: No   Emotionally Abused: No   Physically Abused: No   Sexually Abused: No    Outpatient Medications Prior to Visit  Medication Sig Dispense Refill   allopurinol (ZYLOPRIM) 100 MG tablet Take 1 tablet (100 mg total) by mouth daily. 30 tablet 6   atorvastatin (LIPITOR) 10 MG tablet Take 1 tablet (10 mg total) by mouth daily. 90 tablet 0   Cholecalciferol (VITAMIN D3) 50 MCG (2000 UT) TABS Take 2,000 Units by mouth daily.     Coenzyme Q10 (COQ10) 100 MG CAPS Take 100 mg by mouth daily.     cyclobenzaprine (FLEXERIL) 5 MG tablet Take 1 tablet (5 mg total) by mouth 3 (three) times daily as needed for muscle spasms. 30 tablet 1   Multiple Vitamin (MULTIVITAMIN WITH MINERALS) TABS tablet Take 1 tablet by mouth daily.     Omega-3 Fatty  Acids (FISH OIL PO) Take 2,000 mg by mouth daily.      rivaroxaban (XARELTO) 20 MG TABS tablet Take 1 tablet (20 mg total) by mouth daily with supper. 30 tablet 11   colchicine 0.6 MG tablet Take 1 tablet (0.6 mg total) by mouth daily. 10 tablet 0  metoprolol succinate (TOPROL-XL) 50 MG 24 hr tablet Take 1 tablet (50 mg total) by mouth daily. Take with or immediately following a meal. 90 tablet 3   No facility-administered medications prior to visit.    Allergies  Allergen Reactions   Ultram [Tramadol Hcl] Itching    "Tunnel vision" warm feeling all over and wanted to jump out of his skin    Review of Systems  Constitutional:  Negative for chills and fever.  Respiratory:  Negative for cough and shortness of breath.   Cardiovascular:  Negative for chest pain and palpitations.  Musculoskeletal:  Positive for arthralgias (Left toe) and joint swelling.  Skin:  Positive for rash.  Neurological:  Negative for weakness and numbness.      Objective:    Physical Exam Vitals reviewed.  Constitutional:      General: He is not in acute distress.    Appearance: He is not diaphoretic.  HENT:     Head: Normocephalic and atraumatic.     Nose: Nose normal.     Mouth/Throat:     Mouth: Mucous membranes are moist.  Eyes:     General: No scleral icterus.    Extraocular Movements: Extraocular movements intact.  Cardiovascular:     Rate and Rhythm: Normal rate and regular rhythm.     Pulses: Normal pulses.     Heart sounds: No murmur heard. Musculoskeletal:        General: Swelling (L great toe - 1st MTP joint, slightly tender and erythematous) present.     Cervical back: Neck supple. No tenderness.     Left lower leg: No edema.  Skin:    General: Skin is warm.     Findings: Rash (Erythematous patches over left side of back) present.  Neurological:     General: No focal deficit present.     Mental Status: He is alert and oriented to person, place, and time.  Psychiatric:        Mood  and Affect: Mood normal.        Behavior: Behavior normal.    BP 136/75 (BP Location: Left Arm, Patient Position: Sitting, Cuff Size: Normal)   Pulse 86   Resp 18   Ht '6\' 2"'$  (1.88 m)   Wt 201 lb 1.9 oz (91.2 kg)   SpO2 96%   BMI 25.82 kg/m  Wt Readings from Last 3 Encounters:  11/28/20 201 lb 1.9 oz (91.2 kg)  09/09/20 200 lb 12.8 oz (91.1 kg)  08/26/20 198 lb 12.8 oz (90.2 kg)    Health Maintenance Due  Topic Date Due   Pneumococcal Vaccine 6-18 Years old (1 - PCV) Never done   HIV Screening  Never done   Hepatitis C Screening  Never done   Zoster Vaccines- Shingrix (1 of 2) Never done   COLONOSCOPY (Pts 45-73yr Insurance coverage will need to be confirmed)  Never done   INFLUENZA VACCINE  11/24/2020    There are no preventive care reminders to display for this patient.   No results found for: TSH Lab Results  Component Value Date   WBC 4.5 05/21/2020   HGB 16.3 05/21/2020   HCT 49.0 05/21/2020   MCV 98.0 05/21/2020   PLT 177 05/21/2020   Lab Results  Component Value Date   NA 138 05/21/2020   K 5.1 05/21/2020   CO2 28 05/21/2020   GLUCOSE 105 (H) 05/21/2020   BUN 17 05/21/2020   CREATININE 0.71 05/21/2020   BILITOT 0.6 10/03/2019   ALKPHOS  57 08/12/2019   AST 14 10/03/2019   ALT 17 10/03/2019   PROT 6.8 10/03/2019   ALBUMIN 2.8 (L) 08/12/2019   CALCIUM 9.4 05/21/2020   ANIONGAP 6 05/21/2020   Lab Results  Component Value Date   CHOL 174 06/28/2019   Lab Results  Component Value Date   HDL 53 06/28/2019   Lab Results  Component Value Date   LDLCALC 76 06/28/2019   Lab Results  Component Value Date   TRIG 375 (H) 06/28/2019   Lab Results  Component Value Date   CHOLHDL 3.3 06/28/2019   Lab Results  Component Value Date   HGBA1C 5.4 08/07/2019       Assessment & Plan:   Problem List Items Addressed This Visit   None Visit Diagnoses     Acute gout involving toe of left foot, unspecified cause    -  Primary Appears to be gout  flare as nontraumatic swelling Uric acid level was elevated in the past - will need Uric acid level checked when gout flare resolves Colchicine for acute flare Continue Allopuinol   Relevant Medications   colchicine 0.6 MG tablet    Tinea versicolor     Rash on back likely tinea Reports outdoor activities - gardening Ketoconazole cream    Relevant Medications   ketoconazole (NIZORAL) 2 % cream        Meds ordered this encounter  Medications   colchicine 0.6 MG tablet    Sig: Take 2 tablets once followed by 1 tablet 1 hour later. Start taking 1 tablet daily from day 2.    Dispense:  10 tablet    Refill:  0   ketoconazole (NIZORAL) 2 % cream    Sig: Apply 1 application topically daily.    Dispense:  15 g    Refill:  0     Taryne Kiger Keith Rake, MD

## 2020-11-28 NOTE — Telephone Encounter (Signed)
Pt advised no available appts he is coming to office to be a work in today

## 2021-01-14 ENCOUNTER — Telehealth: Payer: Self-pay | Admitting: Cardiology

## 2021-01-14 MED ORDER — AMOXICILLIN 500 MG PO TABS: 2000.0000 mg | ORAL_TABLET | ORAL | 0 refills | Status: DC

## 2021-01-14 NOTE — Telephone Encounter (Signed)
New message     Patient having dental cleaning done tomorrow needs premeds called into [facility] [address]

## 2021-01-14 NOTE — Telephone Encounter (Signed)
Pt notified that Rx has been call in. Pt thankful for phone call.

## 2021-01-14 NOTE — Telephone Encounter (Signed)
Pt states that he has a prosthetic valve and is in need of antibiotic called in before his dental procedure tomorrow. Please advise.

## 2021-01-24 ENCOUNTER — Other Ambulatory Visit: Payer: Self-pay | Admitting: Internal Medicine

## 2021-02-19 ENCOUNTER — Encounter: Payer: BC Managed Care – PPO | Admitting: Family Medicine

## 2021-02-26 ENCOUNTER — Other Ambulatory Visit: Payer: Self-pay

## 2021-02-26 ENCOUNTER — Ambulatory Visit (INDEPENDENT_AMBULATORY_CARE_PROVIDER_SITE_OTHER): Payer: BC Managed Care – PPO | Admitting: Internal Medicine

## 2021-02-26 ENCOUNTER — Encounter: Payer: Self-pay | Admitting: Internal Medicine

## 2021-02-26 VITALS — BP 128/82 | HR 89 | Resp 18 | Ht 74.0 in | Wt 197.0 lb

## 2021-02-26 DIAGNOSIS — Z0001 Encounter for general adult medical examination with abnormal findings: Secondary | ICD-10-CM | POA: Diagnosis not present

## 2021-02-26 DIAGNOSIS — E559 Vitamin D deficiency, unspecified: Secondary | ICD-10-CM | POA: Diagnosis not present

## 2021-02-26 DIAGNOSIS — Z23 Encounter for immunization: Secondary | ICD-10-CM

## 2021-02-26 DIAGNOSIS — M79671 Pain in right foot: Secondary | ICD-10-CM | POA: Diagnosis not present

## 2021-02-26 DIAGNOSIS — I10 Essential (primary) hypertension: Secondary | ICD-10-CM | POA: Diagnosis not present

## 2021-02-26 DIAGNOSIS — M7051 Other bursitis of knee, right knee: Secondary | ICD-10-CM | POA: Insufficient documentation

## 2021-02-26 DIAGNOSIS — Z1159 Encounter for screening for other viral diseases: Secondary | ICD-10-CM

## 2021-02-26 DIAGNOSIS — M5030 Other cervical disc degeneration, unspecified cervical region: Secondary | ICD-10-CM | POA: Insufficient documentation

## 2021-02-26 DIAGNOSIS — Z114 Encounter for screening for human immunodeficiency virus [HIV]: Secondary | ICD-10-CM

## 2021-02-26 DIAGNOSIS — E785 Hyperlipidemia, unspecified: Secondary | ICD-10-CM | POA: Diagnosis not present

## 2021-02-26 DIAGNOSIS — Z125 Encounter for screening for malignant neoplasm of prostate: Secondary | ICD-10-CM | POA: Insufficient documentation

## 2021-02-26 MED ORDER — DICLOFENAC SODIUM 1 % EX GEL: 4.0000 g | Freq: Four times a day (QID) | CUTANEOUS | 0 refills | Status: DC

## 2021-02-26 NOTE — Assessment & Plan Note (Signed)
BP Readings from Last 1 Encounters:  02/26/21 128/82   Well-controlled Counseled for compliance with the medications Advised DASH diet and moderate exercise/walking, at least 150 mins/week

## 2021-02-26 NOTE — Assessment & Plan Note (Signed)
Annual exam as documented. Counseling done  re healthy lifestyle involving commitment to 150 minutes exercise per week, heart healthy diet, and attaining healthy weight.The importance of adequate sleep also discussed. Changes in health habits are decided on by the patient with goals and time frames  set for achieving them. Immunization and cancer screening needs are specifically addressed at this visit. 

## 2021-02-26 NOTE — Patient Instructions (Signed)
Please apply Voltaren gel for knee pain.  Please contact us if you notice any groin mass or bulge.  Please continue to take medications as prescribed.  Continue to follow low salt diet and ambulate as tolerated.

## 2021-02-26 NOTE — Progress Notes (Signed)
Established Patient Office Visit  Subjective:  Patient ID: Donald [name], male    DOB: January 15, 1966  Age: 55 y.o. MRN: [id number]  CC:  Chief Complaint  Patient presents with   Annual Exam    Annual exam pt is having left shoulder pain making his left arm and hand go numb also pt has pain in right groin feels like a bruise pain but nothing there also having right knee pain     HPI Donald [name] is a 55 y.o. male with past medical history of AR s/p AV replacement, nonischemic CM, HTN, paroxysmal A Fib, gout and HLD who presents for annual physical.  He complains of neck pain, radiating to left UE, which is constant, worse with movement, and is associated with left hand numbness.  Previous x-ray and MRI of cervical spine in the chart show history of DDD of cervical spine and bony encroachment over neural foramina.  She has had lumbar spine surgery in the past.  Denies any recent injury or fall.  He complains of right knee pain, which is tender to touch, unrelated to movement and is worse at nighttime.  Denies any recent injury.  He also had noticed right groin pain yesterday, which is improved today. He has had left inguinal hernia repair when he was 5.  BP is well-controlled. Takes medications regularly. Patient denies headache, dizziness, chest pain, dyspnea or palpitations.  He has started taking Allopurinol for gout, and has not had any gout flares since then.  He had colonoscopy scheduled, but had to cancel it.  He had flu vaccine in the office today.    Past Medical History:  Diagnosis Date   Acute gout of left foot 10/03/2019   Alcohol use 06/27/2019   Bone tumor 11/22/2013   Cardiomyopathy ([place])    a. EF 30 to 35% by echo in 06/2019 in the setting of severe AI   COVID-19 04/16/2020   Encounter for screening for malignant neoplasm of colon 06/27/2019   Glenoid labral tear 10/27/2010   History of gout 06/27/2019   Hyperlipidemia    Patent foramen ovale    Rotator cuff tear, right  10/27/2010   S/P aortic valve replacement with bioprosthetic valve 08/09/2019   29 mm Edwards Inspiris Resilia stented bovine pericardial tissue valve   S/P patent foramen ovale closure 08/09/2019   Severe aortic insufficiency     Past Surgical History:  Procedure Laterality Date   AORTIC VALVE REPLACEMENT N/A 08/09/2019   Procedure: AORTIC VALVE REPLACEMENT (AVR) using Margaretha Sheffield Resilia 29 MM Aortic Valve.;  Surgeon: Rexene Alberts, MD;  Location: [facility];  Service: Open Heart Surgery;  Laterality: N/A;   BACK SURGERY     multiple back surgery   Benign tumor     Left shin   BUBBLE STUDY  07/05/2019   Procedure: BUBBLE STUDY;  Surgeon: Satira Sark, MD;  Location: [facility];  Service: Cardiovascular;;   CARDIAC VALVE REPLACEMENT N/A    [employer] 02/19/2020   HERNIA REPAIR     REPAIR OF PATENT FORAMEN OVALE N/A 08/09/2019   Procedure: Closure Of Patent Foramen Ovale;  Surgeon: Rexene Alberts, MD;  Location: [facility];  Service: Open Heart Surgery;  Laterality: N/A;   RIGHT/LEFT HEART CATH AND CORONARY ANGIOGRAPHY N/A 07/26/2019   Procedure: RIGHT/LEFT HEART CATH AND CORONARY ANGIOGRAPHY;  Surgeon: Burnell Blanks, MD;  Location: [facility];  Service: Cardiovascular;  Laterality: N/A;   SHOULDER SURGERY Left  SHOULDER SURGERY Right    TEE WITHOUT CARDIOVERSION N/A 07/05/2019   Procedure: TRANSESOPHAGEAL ECHOCARDIOGRAM (TEE) WITH PROPOFOL;  Surgeon: Satira Sark, MD;  Location: [facility];  Service: Cardiovascular;  Laterality: N/A;   TEE WITHOUT CARDIOVERSION N/A 08/09/2019   Procedure: TRANSESOPHAGEAL ECHOCARDIOGRAM (TEE);  Surgeon: Rexene Alberts, MD;  Location: [facility];  Service: Open Heart Surgery;  Laterality: N/A;   Thumb surgery Left x3   VASECTOMY N/A    [employer] 02/19/2020    Family History  Problem Relation Age of Onset   Other Father        Kidney    Social History   Socioeconomic History   Marital status: Married    Spouse name: [name]    Number  of children: 4   Years of education: Not on file   Highest education level: Some college, no degree  Occupational History   Occupation: Corporate investment banker    Employer: [employer]  Tobacco Use   Smoking status: Former    Packs/day: 0.25    Types: Cigarettes    Quit date: 08/02/2019    Years since quitting: 1.5   Smokeless tobacco: Never   Tobacco comments:    Cutting down < 5 cig / day  Vaping Use   Vaping Use: Never used  Substance and Sexual Activity   Alcohol use: Yes    Alcohol/week: 12.0 standard drinks    Types: 12 Shots of liquor per week    Comment: on Friday and Saturday   Drug use: No   Sexual activity: Yes    Birth control/protection: Surgical  Other Topics Concern   Not on file  Social History Narrative   Live [name] wife of 2 years, previously was widowed from first wife from cancer 01/2011      Four children      Enjoy: golf, target shooting       Diet:    Caffeine: 16-20 oz coffee    Water: 6-7 bottles       Wears seat belt    Smoke detectors at home    Does not use phone while driving    Social Determinants of Health   Financial Resource Strain: Not on file  Food Insecurity: Not on file  Transportation Needs: Not on file  Physical Activity: Not on file  Stress: Not on file  Social Connections: Not on file  Intimate Partner Violence: Not on file    Outpatient Medications Prior to Visit  Medication Sig Dispense Refill   allopurinol (ZYLOPRIM) 100 MG tablet Take 1 tablet (100 mg total) by mouth daily. 30 tablet 6   amoxicillin (AMOXIL) 500 MG tablet Take 4 tablets (2,000 mg total) by mouth as directed. Take 2000 mg 30 min to 1 hour prior to dental procedure 4 tablet 0   atorvastatin (LIPITOR) 10 MG tablet TAKE 1 TABLET BY MOUTH EVERY DAY 90 tablet 1   Cholecalciferol (VITAMIN D3) 50 MCG (2000 UT) TABS Take 2,000 Units by mouth daily.     Coenzyme Q10 (COQ10) 100 MG CAPS Take 100 mg by mouth daily.     cyclobenzaprine (FLEXERIL) 5 MG tablet  Take 1 tablet (5 mg total) by mouth 3 (three) times daily as needed for muscle spasms. 30 tablet 1   ketoconazole (NIZORAL) 2 % cream Apply 1 application topically daily. 15 g 0   metoprolol succinate (TOPROL-XL) 50 MG 24 hr tablet Take 1 tablet (50 mg total) by mouth daily. Take with or immediately following a meal. 90  tablet 3   Multiple Vitamin (MULTIVITAMIN WITH MINERALS) TABS tablet Take 1 tablet by mouth daily.     Omega-3 Fatty Acids (FISH OIL PO) Take 2,000 mg by mouth daily.      rivaroxaban (XARELTO) 20 MG TABS tablet Take 1 tablet (20 mg total) by mouth daily with supper. 30 tablet 11   colchicine 0.6 MG tablet Take 2 tablets once followed by 1 tablet 1 hour later. Start taking 1 tablet daily from day 2. 10 tablet 0   No facility-administered medications prior to visit.    Allergies  Allergen Reactions   Ultram [Tramadol Hcl] Itching    "Tunnel vision" warm feeling all over and wanted to jump out of his skin    ROS Review of Systems  Constitutional:  Negative for chills and fever.  HENT:  Negative for congestion, sinus pressure and sinus pain.   Eyes:  Negative for pain and discharge.  Respiratory:  Negative for cough and shortness of breath.   Cardiovascular:  Negative for chest pain and palpitations.  Gastrointestinal:  Negative for diarrhea, nausea and vomiting.  Genitourinary:  Negative for dysuria and hematuria.  Musculoskeletal:  Positive for arthralgias (R shoulder, left knee) and neck pain. Negative for neck stiffness.  Skin:  Positive for rash.  Neurological:  Negative for weakness and numbness.  Psychiatric/Behavioral:  Negative for agitation and behavioral problems.      Objective:    Physical Exam Vitals reviewed.  Constitutional:      General: He is not in acute distress.    Appearance: He is not diaphoretic.  HENT:     Head: Normocephalic and atraumatic.     Nose: Nose normal.     Mouth/Throat:     Mouth: Mucous membranes are moist.  Eyes:      General: No scleral icterus.    Extraocular Movements: Extraocular movements intact.  Cardiovascular:     Rate and Rhythm: Normal rate and regular rhythm.     Pulses: Normal pulses.     Heart sounds: Normal heart sounds. No murmur heard. Pulmonary:     Breath sounds: Normal breath sounds. No wheezing or rales.  Abdominal:     Palpations: Abdomen is soft.     Tenderness: There is no abdominal tenderness.  Genitourinary:    Penis: Normal.      Comments: No bulge or swelling in groin area Musculoskeletal:     Cervical back: Neck supple. No tenderness.     Right lower leg: No edema.     Left lower leg: No edema.  Skin:    General: Skin is warm.     Findings: No rash.  Neurological:     General: No focal deficit present.     Mental Status: He is alert and oriented to person, place, and time.     Cranial Nerves: No cranial nerve deficit.     Sensory: No sensory deficit.     Motor: No weakness.  Psychiatric:        Mood and Affect: Mood normal.        Behavior: Behavior normal.    BP 128/82 (BP Location: Left Arm, Cuff Size: Normal)   Pulse 89   Resp 18   Ht 6\' 2"  (1.88 m)   Wt 197 lb 0.6 oz (89.4 kg)   SpO2 96%   BMI 25.30 kg/m  Wt Readings from Last 3 Encounters:  02/26/21 197 lb 0.6 oz (89.4 kg)  11/28/20 201 lb 1.9 oz (91.2 kg)  09/09/20 200  lb 12.8 oz (91.1 kg)    No results found for: TSH Lab Results  Component Value Date   WBC 4.5 05/21/2020   HGB 16.3 05/21/2020   HCT 49.0 05/21/2020   MCV 98.0 05/21/2020   PLT 177 05/21/2020   Lab Results  Component Value Date   NA 138 05/21/2020   K 5.1 05/21/2020   CO2 28 05/21/2020   GLUCOSE 105 (H) 05/21/2020   BUN 17 05/21/2020   CREATININE 0.71 05/21/2020   BILITOT 0.6 10/03/2019   ALKPHOS 57 08/12/2019   AST 14 10/03/2019   ALT 17 10/03/2019   PROT 6.8 10/03/2019   ALBUMIN 2.8 (L) 08/12/2019   CALCIUM 9.4 05/21/2020   ANIONGAP 6 05/21/2020   Lab Results  Component Value Date   CHOL 174 06/28/2019    Lab Results  Component Value Date   HDL 53 06/28/2019   Lab Results  Component Value Date   LDLCALC 76 06/28/2019   Lab Results  Component Value Date   TRIG 375 (H) 06/28/2019   Lab Results  Component Value Date   CHOLHDL 3.3 06/28/2019   Lab Results  Component Value Date   HGBA1C 5.4 08/07/2019      Assessment & Plan:   Problem List Items Addressed This Visit       Encounter for general adult medical examination with abnormal findings - Primary   Annual exam as documented. Counseling done  re healthy lifestyle involving commitment to 150 minutes exercise per week, heart healthy diet, and attaining healthy weight.The importance of adequate sleep also discussed. Changes in health habits are decided on by the patient with goals and time frames  set for achieving them. Immunization and cancer screening needs are specifically addressed at this visit.     Relevant Orders  CBC with Differential  Comprehensive metabolic panel  Hemoglobin A1c  Lipid panel  TSH    Cardiovascular and Mediastinum   Essential hypertension    BP Readings from Last 1 Encounters:  02/26/21 128/82  Well-controlled Counseled for compliance with the medications Advised DASH diet and moderate exercise/walking, at least 150 mins/week         Musculoskeletal and Integument   DDD (degenerative disc disease), cervical    Neck, shoulder and hand pain/numbness likely due to cervical DDD Old X-ray and MRI of cervical spine reviewed from chart - showed DDD of cervical spine and bony encroachment over neural foramina Referred to spine surgery      Relevant Orders   Ambulatory referral to Spine Surgery   Suprapatellar bursitis of right knee    Tenderness over superior and lateral side of knee, could be bursitis Advised to apply heating pad or ice Voltaren gel as needed Unable to take NSAIDs as he takes Xarelto      Relevant Medications   diclofenac Sodium (VOLTAREN) 1 % GEL     Other    Need for immunization against influenza   Relevant Orders   Flu Vaccine QUAD 61mo+IM (Fluarix, Fluzone & Alfiuria Quad PF) (Completed)      Screening for prostate cancer    Ordered PSA after discussing its limitations for prostate cancer screening, including false positive results leading additional investigations.      Relevant Orders   PSA(Must document that pt has been informed of limitations of PSA testing.)   Other Visit Diagnoses     Need for hepatitis C screening test       Relevant Orders   Hepatitis C antibody screen  Encounter for screening for HIV       Relevant Orders   HIV antibody       Meds ordered this encounter  Medications   diclofenac Sodium (VOLTAREN) 1 % GEL    Sig: Apply 4 g topically 4 (four) times daily.    Dispense:  50 g    Refill:  0    Follow-up: Return in 6 months (on 08/26/2021).    Lindell Spar, MD

## 2021-02-26 NOTE — Assessment & Plan Note (Signed)
Ordered PSA after discussing its limitations for prostate cancer screening, including false positive results leading additional investigations. 

## 2021-02-26 NOTE — Assessment & Plan Note (Signed)
Tenderness over superior and lateral side of knee, could be bursitis Advised to apply heating pad or ice Voltaren gel as needed Unable to take NSAIDs as he takes Xarelto

## 2021-02-26 NOTE — Assessment & Plan Note (Signed)
Neck, shoulder and hand pain/numbness likely due to cervical DDD Old X-ray and MRI of cervical spine reviewed from chart - showed DDD of cervical spine and bony encroachment over neural foramina Referred to spine surgery

## 2021-02-27 LAB — COMPREHENSIVE METABOLIC PANEL
ALT: 33 IU/L (ref 0–44)
AST: 31 IU/L (ref 0–40)
Albumin/Globulin Ratio: 2 (ref 1.2–2.2)
Albumin: 4.4 g/dL (ref 3.8–4.9)
Alkaline Phosphatase: 83 IU/L (ref 44–121)
BUN/Creatinine Ratio: 22 — ABNORMAL HIGH (ref 9–20)
BUN: 20 mg/dL (ref 6–24)
Bilirubin Total: 0.4 mg/dL (ref 0.0–1.2)
CO2: 23 mmol/L (ref 20–29)
Calcium: 9.2 mg/dL (ref 8.7–10.2)
Chloride: 103 mmol/L (ref 96–106)
Creatinine, Ser: 0.91 mg/dL (ref 0.76–1.27)
Globulin, Total: 2.2 g/dL (ref 1.5–4.5)
Glucose: 94 mg/dL (ref 70–99)
Potassium: 4.5 mmol/L (ref 3.5–5.2)
Sodium: 138 mmol/L (ref 134–144)
Total Protein: 6.6 g/dL (ref 6.0–8.5)
eGFR: 101 mL/min/{1.73_m2} (ref >59)

## 2021-02-27 LAB — CBC WITH DIFFERENTIAL/PLATELET
Basophils Absolute: 0 10*3/uL (ref 0.0–0.2)
Basos: 1 %
EOS (ABSOLUTE): 0.2 10*3/uL (ref 0.0–0.4)
Eos: 2 %
Hematocrit: 44.1 % (ref 37.5–51.0)
Hemoglobin: 15.2 g/dL (ref 13.0–17.7)
Immature Grans (Abs): 0 10*3/uL (ref 0.0–0.1)
Immature Granulocytes: 0 %
Lymphocytes Absolute: 1.7 10*3/uL (ref 0.7–3.1)
Lymphs: 24 %
MCH: 32.2 pg (ref 26.6–33.0)
MCHC: 34.5 g/dL (ref 31.5–35.7)
MCV: 93 fL (ref 79–97)
Monocytes Absolute: 0.7 10*3/uL (ref 0.1–0.9)
Monocytes: 10 %
Neutrophils Absolute: 4.5 10*3/uL (ref 1.4–7.0)
Neutrophils: 63 %
Platelets: 177 10*3/uL (ref 150–450)
RBC: 4.72 x10E6/uL (ref 4.14–5.80)
RDW: 12.4 % (ref 11.6–15.4)
WBC: 7.2 10*3/uL (ref 3.4–10.8)

## 2021-02-27 LAB — TSH: TSH: 1.54 u[IU]/mL (ref 0.450–4.500)

## 2021-02-27 LAB — HEMOGLOBIN A1C
Est. average glucose Bld gHb Est-mCnc: 105 mg/dL
Hgb A1c MFr Bld: 5.3 % (ref 4.8–5.6)

## 2021-02-27 LAB — LIPID PANEL
Chol/HDL Ratio: 3.7 ratio (ref 0.0–5.0)
Cholesterol, Total: 189 mg/dL (ref 100–199)
HDL: 51 mg/dL (ref >39)
LDL Chol Calc (NIH): 84 mg/dL (ref 0–99)
Triglycerides: 335 mg/dL — ABNORMAL HIGH (ref 0–149)
VLDL Cholesterol Cal: 54 mg/dL — ABNORMAL HIGH (ref 5–40)

## 2021-02-27 LAB — URIC ACID: Uric Acid: 5.1 mg/dL (ref 3.8–8.4)

## 2021-02-27 LAB — HEPATITIS C ANTIBODY: Hep C Virus Ab: <0.1 s/co ratio (ref 0.0–0.9)

## 2021-02-27 LAB — PSA: Prostate Specific Ag, Serum: 0.8 ng/mL (ref 0.0–4.0)

## 2021-02-27 LAB — HIV ANTIBODY (ROUTINE TESTING W REFLEX): HIV Screen 4th Generation wRfx: NONREACTIVE

## 2021-03-12 ENCOUNTER — Other Ambulatory Visit: Payer: Self-pay

## 2021-03-12 ENCOUNTER — Ambulatory Visit: Payer: BC Managed Care – PPO

## 2021-03-12 ENCOUNTER — Encounter: Payer: Self-pay | Admitting: Nurse Practitioner

## 2021-03-12 ENCOUNTER — Ambulatory Visit (INDEPENDENT_AMBULATORY_CARE_PROVIDER_SITE_OTHER): Payer: BC Managed Care – PPO | Admitting: Nurse Practitioner

## 2021-03-12 DIAGNOSIS — R051 Acute cough: Secondary | ICD-10-CM | POA: Diagnosis not present

## 2021-03-12 NOTE — Progress Notes (Signed)
Virtual Visit via Telephone Note  I connected with Donald [name] @ on 03/12/21 at  1:20 PM EST by telephone and verified that I am speaking with the correct person using two identifiers.  Location: Patient: home  Provider: office at the clinic   I discussed the limitations, risks, security and privacy concerns of performing an evaluation and management service by telephone and the availability of in person appointments. I also discussed with the patient that there may be a patient responsible charge related to this service. The patient expressed understanding and agreed to proceed.   History of Present Illness: Patient woke up this morning feeling ''a little bit off'. He stated that his grandson tested positive for flu 5 days ago, his wife tested positive for flu 2 days ago. He has been the one taking care of both of them. He does have a little non productive cough , temp of 99.0. He is starting to feel achy , no chill, no wheezing , no SOB., no HA.     Observations/Objective:   Assessment and Plan  PT told to come to office to Test pt for flu and COVID  Follow Up Instructions:    I discussed the assessment and treatment plan with the patient. The patient was provided an opportunity to ask questions and all were answered. The patient agreed with the plan and demonstrated an understanding of the instructions.   The patient was advised to come to the office to be tested for flu and covid

## 2021-03-17 DIAGNOSIS — R051 Acute cough: Secondary | ICD-10-CM | POA: Insufficient documentation

## 2021-03-17 NOTE — Assessment & Plan Note (Signed)
PT to come to the office to test for Flu and covid.  PT did not show up at the office.

## 2021-06-01 ENCOUNTER — Other Ambulatory Visit: Payer: Self-pay | Admitting: Cardiology

## 2021-06-01 NOTE — Telephone Encounter (Signed)
Pt last saw Dr [name] 05/21/20, pt is overdue for 6 follow-up, recall in Epic.  Pt needs appt for refills will forward message to schedulers to make follow-up appt.  Last labs 02/26/21 Creat 0.91, age 56, weight 89.4kg, CrCl 117.34, based on CrCl pt is on appropriate dosage of Xarelto 20mg  QD.  Will await appt to refill rx.

## 2021-06-02 NOTE — Telephone Encounter (Signed)
Appt has been made with B Strader PA-C 07/2021.  Xarelto approved x 3 till appt in April.

## 2021-07-03 ENCOUNTER — Other Ambulatory Visit: Payer: Self-pay | Admitting: Cardiology

## 2021-07-26 ENCOUNTER — Other Ambulatory Visit: Payer: Self-pay | Admitting: Internal Medicine

## 2021-08-05 ENCOUNTER — Telehealth (INDEPENDENT_AMBULATORY_CARE_PROVIDER_SITE_OTHER): Payer: BC Managed Care – PPO | Admitting: Student

## 2021-08-05 ENCOUNTER — Encounter: Payer: Self-pay | Admitting: Student

## 2021-08-05 VITALS — Ht 74.0 in | Wt 198.0 lb

## 2021-08-05 DIAGNOSIS — E782 Mixed hyperlipidemia: Secondary | ICD-10-CM

## 2021-08-05 DIAGNOSIS — Z8679 Personal history of other diseases of the circulatory system: Secondary | ICD-10-CM

## 2021-08-05 DIAGNOSIS — Z953 Presence of xenogenic heart valve: Secondary | ICD-10-CM | POA: Diagnosis not present

## 2021-08-05 DIAGNOSIS — I480 Paroxysmal atrial fibrillation: Secondary | ICD-10-CM | POA: Diagnosis not present

## 2021-08-05 MED ORDER — RIVAROXABAN 20 MG PO TABS: 20.0000 mg | ORAL_TABLET | Freq: Every day | ORAL | 3 refills | Status: DC

## 2021-08-05 NOTE — Patient Instructions (Signed)
Medication Instructions:  ?Your physician recommends that you continue on your current medications as directed. Please refer to the Current Medication list given to you today. ? ?*If you need a refill on your cardiac medications before your next appointment, please call your pharmacy* ? ? ?Lab Work: ?NONE  ? ?If you have labs (blood work) drawn today and your tests are completely normal, you will receive your results only by: ?MyChart Message (if you have MyChart) OR ?A paper copy in the mail ?If you have any lab test that is abnormal or we need to change your treatment, we will call you to review the results. ? ? ?Testing/Procedures: ?Your physician has requested that you have an echocardiogram. Echocardiography is a painless test that uses sound waves to create images of your heart. It provides your doctor with information about the size and shape of your heart and how well your heart?s chambers and valves are working. This procedure takes approximately one hour. There are no restrictions for this procedure. ? ? ? ?Follow-Up: ?At [facility], you and your health needs are our priority.  As part of our continuing mission to provide you with exceptional heart care, we have created designated Provider Care Teams.  These Care Teams include your primary Cardiologist (physician) and Advanced Practice Providers (APPs -  Physician Assistants and Nurse Practitioners) who all work together to provide you with the care you need, when you need it. ? ?We recommend signing up for the patient portal called "MyChart".  Sign up information is provided on this After Visit Summary.  MyChart is used to connect with patients for Virtual Visits (Telemedicine).  Patients are able to view lab/test results, encounter notes, upcoming appointments, etc.  Non-urgent messages can be sent to your provider as well.   ?To learn more about what you can do with MyChart, go to [email or web address].   ? ?Your next appointment:   ?1  year(s) ? ?The format for your next appointment:   ?In Person ? ?Provider:   ?Rozann Lesches, MD  ? ? ?Other Instructions ?Thank you for choosing [facility]! ? ? ? ?Important Information About Sugar ? ? ? ? ? ? ?

## 2021-08-05 NOTE — Progress Notes (Signed)
? ?Virtual Visit via Telephone Note  ? ?This visit type was conducted due to national recommendations for restrictions regarding the COVID-19 Pandemic (e.g. social distancing) in an effort to limit this patient's exposure and mitigate transmission in our community.  Due to his co-morbid illnesses, this patient is at least at moderate risk for complications without adequate follow up.  This format is felt to be most appropriate for this patient at this time.  The patient did not have access to video technology/had technical difficulties with video requiring transitioning to audio format only (telephone).  All issues noted in this document were discussed and addressed.  No physical exam could be performed with this format.  Please refer to the patient's chart for his  consent to telehealth for [facility].  ? ? ?Date:  08/06/2021  ? ?ID:  Donald [name], DOB Dec 25, 1965, MRN [id number] ?The patient was identified using 2 identifiers. ? ?Patient Location: Home ?Provider Location: Office/Clinic ? ? ?PCP:  Lindell Spar, MD ?  ?[facility] Providers ?Cardiologist:  Rozann Lesches, MD    ? ?Evaluation Performed:  Follow-Up Visit ? ?Chief Complaint: No complaints ? ?History of Present Illness:   ? ?Donald [name] is a 56 y.o. male with past medical history of severe AI (s/p AVR in 07/2019 with Oletta Lamas Inspiris Resilia stented bovine pericardial tissue valve), HFimpEF (EF 30-35% by echo in 06/2019, improved to 45-50% by repeat imaging in 09/2019 and at 50% in 10/2020), paroxysmal atrial fibrillation and HLD who presents for an overdue follow-up telehealth visit.  ? ?He was last examined by Dr. [name] in 04/2020 and he denied any recent anginal symptoms. He was smoking and consuming alcohol again, therefore cessation was advised. He was switched from Eliquis to Xarelto due to changes in his insurance coverage. Repeat echo in 10/2020 showed his EF remained stable at 50% and his prosthetic aortic valve was functioning  normally with no evidence of stenosis or regurgitation.  ? ?In talking with the patient today, he reports overall doing well from a cardiac perspective since his last office visit. He continues to work over 11 hours a day and denies any recent chest pain or dyspnea on exertion with routine activities. He also remains active at home as he has a 21-1/2-year-old. No specific orthopnea, PND, pitting edema or palpitations. Reports good compliance with his medications. Remains on Xarelto and experiences easy bleeding but no melena, hematochezia or hematuria.  ? ?He has reduced his tobacco use to less than half a pack per day and no longer consumes liquor. He does report occasional glasses of wine a few days each week. ? ?The patient does not have symptoms concerning for COVID-19 infection (fever, chills, cough, or new shortness of breath).  ? ? ?Past Medical History:  ?Diagnosis Date  ? Acute gout of left foot 10/03/2019  ? Alcohol use 06/27/2019  ? Bone tumor 11/22/2013  ? Cardiomyopathy ([place])   ? a. EF 30 to 35% by echo in 06/2019 in the setting of severe AI  ? COVID-19 04/16/2020  ? Encounter for screening for malignant neoplasm of colon 06/27/2019  ? Glenoid labral tear 10/27/2010  ? History of gout 06/27/2019  ? Hyperlipidemia   ? Patent foramen ovale   ? Rotator cuff tear, right 10/27/2010  ? S/P aortic valve replacement with bioprosthetic valve 08/09/2019  ? 29 mm Edwards Inspiris Resilia stented bovine pericardial tissue valve  ? S/P patent foramen ovale closure 08/09/2019  ? Severe aortic insufficiency   ? ?  Past Surgical History:  ?Procedure Laterality Date  ? AORTIC VALVE REPLACEMENT N/A 08/09/2019  ? Procedure: AORTIC VALVE REPLACEMENT (AVR) using Margaretha Sheffield Resilia 29 MM Aortic Valve.;  Surgeon: Rexene Alberts, MD;  Location: [place];  Service: Open Heart Surgery;  Laterality: N/A;  ? BACK SURGERY    ? multiple back surgery  ? Benign tumor    ? Left shin  ? BUBBLE STUDY  07/05/2019  ? Procedure: BUBBLE STUDY;  Surgeon:  Satira Sark, MD;  Location: [facility];  Service: Cardiovascular;;  ? CARDIAC VALVE REPLACEMENT N/A   ? Phreesia 02/19/2020  ? HERNIA REPAIR    ? REPAIR OF PATENT FORAMEN OVALE N/A 08/09/2019  ? Procedure: Closure Of Patent Foramen Ovale;  Surgeon: Rexene Alberts, MD;  Location: [place];  Service: Open Heart Surgery;  Laterality: N/A;  ? RIGHT/LEFT HEART CATH AND CORONARY ANGIOGRAPHY N/A 07/26/2019  ? Procedure: RIGHT/LEFT HEART CATH AND CORONARY ANGIOGRAPHY;  Surgeon: Burnell Blanks, MD;  Location: [place] [facility];  Service: Cardiovascular;  Laterality: N/A;  ? SHOULDER SURGERY Left   ? SHOULDER SURGERY Right   ? TEE WITHOUT CARDIOVERSION N/A 07/05/2019  ? Procedure: TRANSESOPHAGEAL ECHOCARDIOGRAM (TEE) WITH PROPOFOL;  Surgeon: Satira Sark, MD;  [facility]: [facility];  Service: Cardiovascular;  Laterality: N/A;  ? TEE WITHOUT CARDIOVERSION N/A 08/09/2019  ? Procedure: TRANSESOPHAGEAL ECHOCARDIOGRAM (TEE);  Surgeon: Rexene Alberts, MD;  Location: [place];  Service: Open Heart Surgery;  Laterality: N/A;  ? Thumb surgery Left x3  ? VASECTOMY N/A   ? Phreesia 02/19/2020  ?  ? ?Current Meds  ?Medication Sig  ? allopurinol (ZYLOPRIM) 100 MG tablet Take 1 tablet (100 mg total) by mouth daily.  ? amoxicillin (AMOXIL) 500 MG tablet Take 4 tablets (2,000 mg total) by mouth as directed. Take 2000 mg 30 min to 1 hour prior to dental procedure  ? atorvastatin (LIPITOR) 10 MG tablet TAKE 1 TABLET BY MOUTH EVERY DAY  ? Cholecalciferol (VITAMIN D3) 50 MCG (2000 UT) TABS Take 2,000 Units by mouth daily.  ? Coenzyme Q10 (COQ10) 100 MG CAPS Take 100 mg by mouth daily.  ? diclofenac Sodium (VOLTAREN) 1 % GEL Apply 4 g topically 4 (four) times daily.  ? ketoconazole (NIZORAL) 2 % cream Apply 1 application topically daily.  ? metoprolol succinate (TOPROL-XL) 50 MG 24 hr tablet TAKE 1 TABLET BY MOUTH DAILY. TAKE WITH OR IMMEDIATELY FOLLOWING A MEAL.  ? Multiple Vitamin (MULTIVITAMIN WITH MINERALS) TABS tablet Take 1 tablet by  mouth daily.  ? Omega-3 Fatty Acids (FISH OIL PO) Take 2,000 mg by mouth daily.   ? [DISCONTINUED] rivaroxaban (XARELTO) 20 MG TABS tablet TAKE 1 TABLET BY MOUTH DAILY WITH SUPPER.  ?  ? ?Allergies:   Ultram [tramadol hcl]  ? ?Social History  ? ?Tobacco Use  ? Smoking status: Every Day  ?  Packs/day: 0.50  ?  Types: Cigarettes  ?  Last attempt to quit: 08/02/2019  ?  Years since quitting: 2.0  ? Smokeless tobacco: Never  ? Tobacco comments:  ?  Cutting down < 5 cig / day  ?Vaping Use  ? Vaping Use: Never used  ?Substance Use Topics  ? Alcohol use: Yes  ?  Alcohol/week: 12.0 standard drinks  ?  Types: 12 Shots of liquor per week  ?  Comment: on Friday and Saturday  ? Drug use: No  ?  ? ?Family Hx: ?The patient's family history includes Other in his father. ? ?  ROS:   ?Please see the history of present illness.    ? ?All other systems reviewed and are negative. ? ? ?Prior CV studies:   ?The following studies were reviewed today: ? ?R/LHC: 07/2019 ?Prox RCA lesion is 20% stenosed. ?Dist RCA lesion is 10% stenosed. ?3rd Mrg lesion is 10% stenosed. ?Prox LAD to Mid LAD lesion is 20% stenosed. ?1st Diag lesion is 30% stenosed. ?  ?1. Mild non-obstructive CAD ?2. Severe aortic valve insufficiency.  ?  ?Recommendations: Continue planning for aortic valve repair. ? ? ?Echocardiogram: 10/2020 ?IMPRESSIONS  ? ? ? 1. Left ventricular ejection fraction, by estimation, is 50%. The left  ?ventricle has low normal function. The left ventricle demonstrates  ?regional wall motion abnormalities (see scoring diagram/findings for  ?description). There is moderate left  ?ventricular hypertrophy. Left ventricular diastolic parameters are  ?consistent with Grade I diastolic dysfunction (impaired relaxation).  ? 2. Right ventricular systolic function is normal. The right ventricular  ?size is normal. Tricuspid regurgitation signal is inadequate for assessing  ?PA pressure.  ? 3. The mitral valve is normal in structure. No evidence of mitral  valve  ?regurgitation. No evidence of mitral stenosis.  ? 4. 29 mm Edwards Inspiris Resilia stented bovine pericardial tissue  ?valve. The aortic valve has been repaired/replaced. Aortic valve  ?regurgit

## 2021-08-06 ENCOUNTER — Encounter: Payer: Self-pay | Admitting: Student

## 2021-08-26 ENCOUNTER — Encounter: Payer: Self-pay | Admitting: Internal Medicine

## 2021-08-26 ENCOUNTER — Ambulatory Visit (INDEPENDENT_AMBULATORY_CARE_PROVIDER_SITE_OTHER): Payer: BC Managed Care – PPO | Admitting: Internal Medicine

## 2021-08-26 VITALS — BP 128/88 | HR 95 | Resp 18 | Ht 74.0 in | Wt 197.8 lb

## 2021-08-26 DIAGNOSIS — E785 Hyperlipidemia, unspecified: Secondary | ICD-10-CM

## 2021-08-26 DIAGNOSIS — I428 Other cardiomyopathies: Secondary | ICD-10-CM | POA: Insufficient documentation

## 2021-08-26 DIAGNOSIS — I10 Essential (primary) hypertension: Secondary | ICD-10-CM

## 2021-08-26 DIAGNOSIS — K2900 Acute gastritis without bleeding: Secondary | ICD-10-CM

## 2021-08-26 DIAGNOSIS — M1009 Idiopathic gout, multiple sites: Secondary | ICD-10-CM

## 2021-08-26 DIAGNOSIS — I480 Paroxysmal atrial fibrillation: Secondary | ICD-10-CM

## 2021-08-26 DIAGNOSIS — Z953 Presence of xenogenic heart valve: Secondary | ICD-10-CM

## 2021-08-26 MED ORDER — OMEPRAZOLE 20 MG PO CPDR: 20.0000 mg | DELAYED_RELEASE_CAPSULE | Freq: Every day | ORAL | 0 refills | Status: DC

## 2021-08-26 MED ORDER — PREDNISONE 20 MG PO TABS: 40.0000 mg | ORAL_TABLET | Freq: Every day | ORAL | 0 refills | Status: DC

## 2021-08-26 NOTE — Assessment & Plan Note (Addendum)
Rate controlled with metoprolol On Xarelto for AC Followed by Cardiology 

## 2021-08-26 NOTE — Progress Notes (Signed)
? ?Established Patient Office Visit ? ?Subjective:  ?Patient ID: Donald [name], male    DOB: 1965-06-14  Age: 56 y.o. MRN: [id number] ? ?CC:  ?Chief Complaint  ?Patient presents with  ? Follow-up  ?  6 month follow up pt bp is elevated sometimes and right index finger is swollen and hurts to bend   ? ? ?HPI ?Donald [name] is a 56 y.o. male with past medical history of AR s/p AV replacement, nonischemic CM, HTN, paroxysmal A Fib, gout and HLD who presents for f/u of his chronic medical conditions. ? ?He complains of right index finger pain and swelling for the last 1 month.  His pain is constant, sharp, nonradiating and is worse with flexing the finger. He denies any recent injury.  Of note, he has history of gout, and was started on allopurinol for it.  He had previous gout flareups in his ankle.  His last uric acid level was 5.1.  He does report occasional alcohol intake, but denies any binge drinking. ? ?Paroxysmal A-fib, nonischemic CM and HTN: BP is well-controlled. Takes medications regularly. Patient denies headache, dizziness, chest pain, dyspnea or palpitations.  He is on metoprolol and Xarelto currently. ? ? ? ?Past Medical History:  ?Diagnosis Date  ? Acute gout of left foot 10/03/2019  ? Alcohol use 06/27/2019  ? Bone tumor 11/22/2013  ? Cardiomyopathy ([place])   ? a. EF 30 to 35% by echo in 06/2019 in the setting of severe AI  ? COVID-19 04/16/2020  ? Encounter for screening for malignant neoplasm of colon 06/27/2019  ? Glenoid labral tear 10/27/2010  ? History of gout 06/27/2019  ? Hyperlipidemia   ? Patent foramen ovale   ? Rotator cuff tear, right 10/27/2010  ? S/P aortic valve replacement with bioprosthetic valve 08/09/2019  ? 29 mm Edwards Inspiris Resilia stented bovine pericardial tissue valve  ? S/P patent foramen ovale closure 08/09/2019  ? Severe aortic insufficiency   ? ? ?Past Surgical History:  ?Procedure Laterality Date  ? AORTIC VALVE REPLACEMENT N/A 08/09/2019  ? Procedure: AORTIC VALVE REPLACEMENT (AVR)  using Margaretha Sheffield Resilia 29 MM Aortic Valve.;  Surgeon: Rexene Alberts, MD;  Location: [place];  Service: Open Heart Surgery;  Laterality: N/A;  ? BACK SURGERY    ? multiple back surgery  ? Benign tumor    ? Left shin  ? BUBBLE STUDY  07/05/2019  ? Procedure: BUBBLE STUDY;  Surgeon: Satira Sark, MD;  Location: [facility];  Service: Cardiovascular;;  ? CARDIAC VALVE REPLACEMENT N/A   ? Phreesia 02/19/2020  ? HERNIA REPAIR    ? REPAIR OF PATENT FORAMEN OVALE N/A 08/09/2019  ? Procedure: Closure Of Patent Foramen Ovale;  Surgeon: Rexene Alberts, MD;  Location: [place];  Service: Open Heart Surgery;  Laterality: N/A;  ? RIGHT/LEFT HEART CATH AND CORONARY ANGIOGRAPHY N/A 07/26/2019  ? Procedure: RIGHT/LEFT HEART CATH AND CORONARY ANGIOGRAPHY;  Surgeon: Burnell Blanks, MD;  Location: [place] [facility];  Service: Cardiovascular;  Laterality: N/A;  ? SHOULDER SURGERY Left   ? SHOULDER SURGERY Right   ? TEE WITHOUT CARDIOVERSION N/A 07/05/2019  ? Procedure: TRANSESOPHAGEAL ECHOCARDIOGRAM (TEE) WITH PROPOFOL;  Surgeon: Satira Sark, MD;  [facility];  Service: Cardiovascular;  Laterality: N/A;  ? TEE WITHOUT CARDIOVERSION N/A 08/09/2019  ? Procedure: TRANSESOPHAGEAL ECHOCARDIOGRAM (TEE);  Surgeon: Rexene Alberts, MD;  Location: [place];  Service: Open Heart Surgery;  Laterality: N/A;  ? Thumb surgery Left  x3  ? VASECTOMY N/A   ? Phreesia 02/19/2020  ? ? ?Family History  ?Problem Relation Age of Onset  ? Other Father   ?     Kidney  ? ? ?Social History  ? ?Socioeconomic History  ? Marital status: Married  ?  Spouse name: [name]   ? Number of children: 4  ? Years of education: Not on file  ? Highest education level: Some college, no degree  ?Occupational History  ? Occupation: Corporate investment banker  ?  Employer: [employer]  ?Tobacco Use  ? Smoking status: Every Day  ?  Packs/day: 0.50  ?  Types: Cigarettes  ?  Last attempt to quit: 08/02/2019  ?  Years since quitting: 2.0  ? Smokeless tobacco: Never  ?  Tobacco comments:  ?  Cutting down < 5 cig / day  ?Vaping Use  ? Vaping Use: Never used  ?Substance and Sexual Activity  ? Alcohol use: Yes  ?  Alcohol/week: 12.0 standard drinks  ?  Types: 12 Shots of liquor per week  ?  Comment: on Friday and Saturday  ? Drug use: No  ? Sexual activity: Yes  ?  Birth control/protection: Surgical  ?Other Topics Concern  ? Not on file  ?Social History Narrative  ? Live [name] wife of 2 years, previously was widowed from first wife from cancer 01/2011  ?   ? Four children  ?   ? Enjoy: golf, target shooting   ?   ? Diet:   ? Caffeine: 16-20 oz coffee   ? Water: 6-7 bottles   ?   ? Wears seat belt   ? Smoke detectors at home   ? Does not use phone while driving   ? ?Social Determinants of Health  ? ?Financial Resource Strain: Not on file  ?Food Insecurity: Not on file  ?Transportation Needs: Not on file  ?Physical Activity: Not on file  ?Stress: Not on file  ?Social Connections: Not on file  ?Intimate Partner Violence: Not on file  ? ? ?Outpatient Medications Prior to Visit  ?Medication Sig Dispense Refill  ? allopurinol (ZYLOPRIM) 100 MG tablet Take 1 tablet (100 mg total) by mouth daily. 30 tablet 6  ? atorvastatin (LIPITOR) 10 MG tablet TAKE 1 TABLET BY MOUTH EVERY DAY 90 tablet 1  ? Cholecalciferol (VITAMIN D3) 50 MCG (2000 UT) TABS Take 2,000 Units by mouth daily.    ? Coenzyme Q10 (COQ10) 100 MG CAPS Take 100 mg by mouth daily.    ? cyclobenzaprine (FLEXERIL) 5 MG tablet Take 1 tablet (5 mg total) by mouth 3 (three) times daily as needed for muscle spasms. 30 tablet 1  ? diclofenac Sodium (VOLTAREN) 1 % GEL Apply 4 g topically 4 (four) times daily. 50 g 0  ? ketoconazole (NIZORAL) 2 % cream Apply 1 application topically daily. 15 g 0  ? metoprolol succinate (TOPROL-XL) 50 MG 24 hr tablet TAKE 1 TABLET BY MOUTH DAILY. TAKE WITH OR IMMEDIATELY FOLLOWING A MEAL. 90 tablet 3  ? Multiple Vitamin (MULTIVITAMIN WITH MINERALS) TABS tablet Take 1 tablet by mouth daily.    ? Omega-3 Fatty  Acids (FISH OIL PO) Take 2,000 mg by mouth daily.     ? rivaroxaban (XARELTO) 20 MG TABS tablet Take 1 tablet (20 mg total) by mouth daily with supper. 90 tablet 3  ? amoxicillin (AMOXIL) 500 MG tablet Take 4 tablets (2,000 mg total) by mouth as directed. Take 2000 mg 30 min to 1 hour prior  to dental procedure (Patient not taking: Reported on 08/26/2021) 4 tablet 0  ? ?No facility-administered medications prior to visit.  ? ? ?Allergies  ?Allergen Reactions  ? Ultram [Tramadol Hcl] Itching  ?  "Tunnel vision" warm feeling all over and wanted to jump out of his skin  ? ? ?ROS ?Review of Systems  ?Constitutional:  Negative for chills and fever.  ?HENT:  Negative for congestion, sinus pressure and sinus pain.   ?Eyes:  Negative for pain and discharge.  ?Respiratory:  Negative for cough and shortness of breath.   ?Cardiovascular:  Negative for chest pain and palpitations.  ?Gastrointestinal:  Negative for diarrhea, nausea and vomiting.  ?Genitourinary:  Negative for dysuria and hematuria.  ?Musculoskeletal:  Positive for arthralgias (R shoulder, left knee), joint swelling (Right index finger) and neck pain. Negative for neck stiffness.  ?Skin:  Positive for rash.  ?Neurological:  Negative for weakness and numbness.  ?Psychiatric/Behavioral:  Negative for agitation and behavioral problems.   ? ?  ?Objective:  ?  ?Physical Exam ?Vitals reviewed.  ?Constitutional:   ?   General: He is not in acute distress. ?   Appearance: He is not diaphoretic.  ?HENT:  ?   Head: Normocephalic and atraumatic.  ?   Nose: Nose normal.  ?   Mouth/Throat:  ?   Mouth: Mucous membranes are moist.  ?Eyes:  ?   General: No scleral icterus. ?   Extraocular Movements: Extraocular movements intact.  ?Cardiovascular:  ?   Rate and Rhythm: Normal rate and regular rhythm.  ?   Pulses: Normal pulses.  ?   Heart sounds: Normal heart sounds. No murmur heard. ?Pulmonary:  ?   Breath sounds: Normal breath sounds. No wheezing or rales.  ?Genitourinary: ?    Penis: Normal.   ?Musculoskeletal:     ?   General: Swelling (MCP and PIP joints of second digit of right hand) present.  ?   Cervical back: Neck supple. No tenderness.  ?   Right lower leg: No edema.  ?

## 2021-08-26 NOTE — Patient Instructions (Addendum)
Please take Prednisone as prescribed. ? ?Please take Prilosec for 5 days while you are on Prednisone. ? ?Please continue taking other medications as prescribed. ?

## 2021-08-26 NOTE — Assessment & Plan Note (Signed)
BP Readings from Last 1 Encounters:  ?08/26/21 128/88  ? ?Well-controlled with Metoprolol ?Counseled for compliance with the medications ?Advised DASH diet and moderate exercise/walking, at least 150 mins/week ?

## 2021-08-26 NOTE — Assessment & Plan Note (Signed)
On statin.

## 2021-08-26 NOTE — Assessment & Plan Note (Signed)
For severe AS Currently euvolemic, denies any chest pain, dyspnea or palpitations 

## 2021-08-26 NOTE — Assessment & Plan Note (Signed)
EF improved to 50% on last echo in 07/22 On metoprolol Denies any dyspnea, orthopnea or PND Followed by Cardiology 

## 2021-08-26 NOTE — Assessment & Plan Note (Addendum)
Right index finger MCP and PIP joint swelling with pain, likely gout flareup ?Started prednisone 40 mg daily, avoid oral NSAID as he is on Xarelto ?Omeprazole for gastritis PPx ?Continue allopurinol for now ?Check uric acid level after acute flareup resolves ?

## 2021-10-05 ENCOUNTER — Ambulatory Visit (INDEPENDENT_AMBULATORY_CARE_PROVIDER_SITE_OTHER): Payer: BC Managed Care – PPO

## 2021-10-05 ENCOUNTER — Encounter: Payer: Self-pay | Admitting: Cardiology

## 2021-10-05 ENCOUNTER — Telehealth: Payer: Self-pay | Admitting: Cardiology

## 2021-10-05 VITALS — BP 152/98 | HR 89 | Ht 74.0 in | Wt 197.0 lb

## 2021-10-05 DIAGNOSIS — R002 Palpitations: Secondary | ICD-10-CM

## 2021-10-05 NOTE — Telephone Encounter (Signed)
Pt states he was running around with a toddler yesterday and was feeling very "off" and had to lay down several times to calm himself. He states he left work early and is now in the [employer] parking lot. He says he isn't experiencing pain, but knows something is wrong. Would like to be seen.

## 2021-10-05 NOTE — Telephone Encounter (Signed)
I spoke with patient. He states he did not come into the office this am. He lives near by and went home. He describes palpitations yesterday and again today. Has a history of PAF. Took BP 150/90, HR 82.    He states he drank wine yesterday and was chasing his 55 yr old child in yard when he first noted palpitations. He laid on the floor for a while and symptoms abated. He went to work this am and felt palpitations again, so he left.    No c/o palpitations now, no SOB,no CP    I asked him to come for nurse visit for an EKG, he agrees.

## 2021-10-05 NOTE — Progress Notes (Signed)
Nurse visit for c/o palpitations- see phone visit from today    EKG, SR, no A-fib noted    Reviewed by Dr.Hilty: He states patient can take an extra Toprol XL 25 mg if symptoms of palpitations persist for more than an hour.    Patient agrees with dispo am=nd update [place] if he is needing to take extra beat blocker.

## 2021-10-15 NOTE — Telephone Encounter (Signed)
error 

## 2021-10-24 ENCOUNTER — Other Ambulatory Visit: Payer: Self-pay | Admitting: Internal Medicine

## 2021-10-24 DIAGNOSIS — M109 Gout, unspecified: Secondary | ICD-10-CM

## 2021-12-07 ENCOUNTER — Other Ambulatory Visit: Payer: Self-pay | Admitting: Cardiology

## 2021-12-07 ENCOUNTER — Other Ambulatory Visit: Payer: Self-pay | Admitting: Internal Medicine

## 2021-12-07 NOTE — Telephone Encounter (Signed)
Prescription refill request for Xarelto received.  Indication: PAF Last office visit: 08/05/21  B Strader PA-C Weight: 89.8kg Age: 56 Scr: 0.91 on 02/26/21 CrCl: 117.87  Based on above findings Xarelto '20mg'$  daily is the appropriate dose.  Refill approved.

## 2022-01-07 ENCOUNTER — Encounter: Payer: Self-pay | Admitting: Internal Medicine

## 2022-01-07 ENCOUNTER — Ambulatory Visit (INDEPENDENT_AMBULATORY_CARE_PROVIDER_SITE_OTHER): Payer: BC Managed Care – PPO | Admitting: Internal Medicine

## 2022-01-07 VITALS — BP 128/84 | HR 86 | Resp 16 | Ht 74.0 in | Wt 199.4 lb

## 2022-01-07 DIAGNOSIS — M545 Low back pain, unspecified: Secondary | ICD-10-CM

## 2022-01-07 DIAGNOSIS — Z23 Encounter for immunization: Secondary | ICD-10-CM | POA: Diagnosis not present

## 2022-01-07 MED ORDER — CYCLOBENZAPRINE HCL 5 MG PO TABS: 5.0000 mg | ORAL_TABLET | Freq: Three times a day (TID) | ORAL | 1 refills | Status: DC | PRN

## 2022-01-07 MED ORDER — PREDNISONE 20 MG PO TABS: 40.0000 mg | ORAL_TABLET | Freq: Every day | ORAL | 0 refills | Status: DC

## 2022-01-07 MED ORDER — METHYLPREDNISOLONE ACETATE 80 MG/ML IJ SUSP
80.0000 mg | Freq: Once | INTRAMUSCULAR | Status: AC
  Administered 2022-01-07: 80 mg via INTRAMUSCULAR

## 2022-01-07 NOTE — Patient Instructions (Signed)
Please take Prednisone as prescribed.  Please take Flexeril as needed for muscle spasms.  Please avoid heavy lifting with left upper extremity.  Okay to apply warm compresses to help with swelling.

## 2022-01-07 NOTE — Progress Notes (Signed)
Acute Office Visit  Subjective:    Patient ID: Donald [name], male    DOB: 11/01/1965, 56 y.o.   MRN: [id number]  Chief Complaint  Patient presents with   Shoulder Pain    Patient has been having shoulder pain since 12-25-21 was working on deck did something at work and has pain    HPI Patient is in today for complaint of left shoulder pain, for the last 2 weeks.  Pain is sharp, constant, worse with movement.  His pain started after he did some work at home with old wooden pieces and had to pull some nails of the wooden pieces.  Then, at his work place today, he started having worsening of left shoulder pain after doing certain work. His pain is 9/37 with certain movement.  He also reports mild numbness in the left hand.  He reports to previous shoulder surgeries in 2001 and 2008 for rotator cuff tear.  Past Medical History:  Diagnosis Date   Acute gout of left foot 10/03/2019   Alcohol use 06/27/2019   Bone tumor 11/22/2013   Cardiomyopathy ([place])    a. EF 30 to 35% by echo in 06/2019 in the setting of severe AI   COVID-19 04/16/2020   Encounter for screening for malignant neoplasm of colon 06/27/2019   Glenoid labral tear 10/27/2010   History of gout 06/27/2019   Hyperlipidemia    Patent foramen ovale    Rotator cuff tear, right 10/27/2010   S/P aortic valve replacement with bioprosthetic valve 08/09/2019   29 mm Edwards Inspiris Resilia stented bovine pericardial tissue valve   S/P patent foramen ovale closure 08/09/2019   Severe aortic insufficiency     Past Surgical History:  Procedure Laterality Date   AORTIC VALVE REPLACEMENT N/A 08/09/2019   Procedure: AORTIC VALVE REPLACEMENT (AVR) using Margaretha Sheffield Resilia 29 MM Aortic Valve.;  Surgeon: Rexene Alberts, MD;  Location: [facility];  Service: Open Heart Surgery;  Laterality: N/A;   BACK SURGERY     multiple back surgery   Benign tumor     Left shin   BUBBLE STUDY  07/05/2019   Procedure: BUBBLE STUDY;  Surgeon: Satira Sark,  MD;  Location: [facility];  Service: Cardiovascular;;   CARDIAC VALVE REPLACEMENT N/A    [employer]reesia 02/19/2020   HERNIA REPAIR     REPAIR OF PATENT FORAMEN OVALE N/A 08/09/2019   Procedure: Closure Of Patent Foramen Ovale;  Surgeon: Rexene Alberts, MD;  Location: [facility];  Service: Open Heart Surgery;  Laterality: N/A;   RIGHT/LEFT HEART CATH AND CORONARY ANGIOGRAPHY N/A 07/26/2019   Procedure: RIGHT/LEFT HEART CATH AND CORONARY ANGIOGRAPHY;  Surgeon: Burnell Blanks, MD;  Location: [facility];  Service: Cardiovascular;  Laterality: N/A;   SHOULDER SURGERY Left    SHOULDER SURGERY Right    TEE WITHOUT CARDIOVERSION N/A 07/05/2019   Procedure: TRANSESOPHAGEAL ECHOCARDIOGRAM (TEE) WITH PROPOFOL;  Surgeon: Satira Sark, MD;  Location: [facility];  Service: Cardiovascular;  Laterality: N/A;   TEE WITHOUT CARDIOVERSION N/A 08/09/2019   Procedure: TRANSESOPHAGEAL ECHOCARDIOGRAM (TEE);  Surgeon: Rexene Alberts, MD;  Location: [facility];  Service: Open Heart Surgery;  Laterality: N/A;   Thumb surgery Left x3   VASECTOMY N/A    Phreesia 02/19/2020    Family History  Problem Relation Age of Onset   Other Father        Kidney    Social History   Socioeconomic History   Marital status: Married  Spouse name: [name]    Number of children: 4   Years of education: Not on file   Highest education level: Some college, no degree  Occupational History   Occupation: Nurse, adult: [employer]  Tobacco Use   Smoking status: Every Day    Packs/day: 0.50    Types: Cigarettes    Last attempt to quit: 08/02/2019    Years since quitting: 2.4   Smokeless tobacco: Never   Tobacco comments:    Cutting down < 5 cig / day  Vaping Use   Vaping Use: Never used  Substance and Sexual Activity   Alcohol use: Yes    Alcohol/week: 12.0 standard drinks of alcohol    Types: 12 Shots of liquor per week    Comment: on Friday and Saturday   Drug use: No   Sexual activity: Yes     Birth control/protection: Surgical  Other Topics Concern   Not on file  Social History Narrative   Live [name] wife of 2 years, previously was widowed from first wife from cancer 01/2011      Four children      Enjoy: golf, target shooting       Diet:    Caffeine: 16-20 oz coffee    Water: 6-7 bottles       Wears seat belt    Smoke detectors at home    Does not use phone while driving    Social Determinants of Health   Financial Resource Strain: Low Risk  (02/20/2020)   Overall Financial Resource Strain (CARDIA)    Difficulty of Paying Living Expenses: Not hard at all  Food Insecurity: No Food Insecurity (02/20/2020)   Hunger Vital Sign    Worried About [place] in the Last Year: Never true    [place] in the Last Year: Never true  Transportation Needs: No Transportation Needs (02/20/2020)   PRAPARE - Hydrologist (Medical): No    Lack of Transportation (Non-Medical): No  Physical Activity: Sufficiently Active (02/20/2020)   Exercise Vital Sign    Days of Exercise per Week: 5 days    Minutes of Exercise per Session: 50 min  Stress: No Stress Concern Present (02/20/2020)   [place]    Feeling of Stress : Not at all  Social Connections: Moderately Isolated (02/20/2020)   Social Connection and Isolation Panel [NHANES]    Frequency of Communication with Friends and Family: More than three times a week    Frequency of Social Gatherings with Friends and Family: Once a week    Attends Religious Services: Never    Marine scientist or Organizations: No    Attends Archivist Meetings: Never    Marital Status: Married  Human resources officer Violence: Not At Risk (02/20/2020)   Humiliation, Afraid, Rape, and Kick questionnaire    Fear of Current or Ex-Partner: No    Emotionally Abused: No    Physically Abused: No    Sexually Abused: No    Outpatient  Medications Prior to Visit  Medication Sig Dispense Refill   allopurinol (ZYLOPRIM) 100 MG tablet TAKE 1 TABLET BY MOUTH EVERY DAY 90 tablet 2   atorvastatin (LIPITOR) 10 MG tablet TAKE 1 TABLET BY MOUTH EVERY DAY 90 tablet 1   Cholecalciferol (VITAMIN D3) 50 MCG (2000 UT) TABS Take 2,000 Units by mouth daily.     Coenzyme Q10 (COQ10)  100 MG CAPS Take 100 mg by mouth daily.     diclofenac Sodium (VOLTAREN) 1 % GEL Apply 4 g topically 4 (four) times daily. 50 g 0   ketoconazole (NIZORAL) 2 % cream Apply 1 application topically daily. 15 g 0   metoprolol succinate (TOPROL-XL) 50 MG 24 hr tablet TAKE 1 TABLET BY MOUTH DAILY. TAKE WITH OR IMMEDIATELY FOLLOWING A MEAL. 90 tablet 3   Multiple Vitamin (MULTIVITAMIN WITH MINERALS) TABS tablet Take 1 tablet by mouth daily.     Omega-3 Fatty Acids (FISH OIL PO) Take 2,000 mg by mouth daily.      omeprazole (PRILOSEC) 20 MG capsule Take 1 capsule (20 mg total) by mouth daily. 7 capsule 0   rivaroxaban (XARELTO) 20 MG TABS tablet TAKE 1 TABLET BY MOUTH DAILY WITH SUPPER 30 tablet 5   cyclobenzaprine (FLEXERIL) 5 MG tablet Take 1 tablet (5 mg total) by mouth 3 (three) times daily as needed for muscle spasms. 30 tablet 1   predniSONE (DELTASONE) 20 MG tablet Take 2 tablets (40 mg total) by mouth daily with breakfast. 10 tablet 0   No facility-administered medications prior to visit.    Allergies  Allergen Reactions   Ultram [Tramadol Hcl] Itching    "Tunnel vision" warm feeling all over and wanted to jump out of his skin    Review of Systems  Constitutional:  Negative for chills and fever.  HENT:  Negative for congestion, sinus pressure and sinus pain.   Eyes:  Negative for pain and discharge.  Respiratory:  Negative for cough and shortness of breath.   Cardiovascular:  Negative for chest pain and palpitations.  Gastrointestinal:  Negative for diarrhea, nausea and vomiting.  Genitourinary:  Negative for dysuria and hematuria.  Musculoskeletal:   Positive for arthralgias (L shoulder), joint swelling (Right index finger) and neck pain. Negative for neck stiffness.  Skin:  Negative for rash.  Neurological:  Positive for numbness. Negative for weakness.  Psychiatric/Behavioral:  Negative for agitation and behavioral problems.        Objective:    Physical Exam Vitals reviewed.  Constitutional:      General: He is not in acute distress.    Appearance: He is not diaphoretic.  HENT:     Head: Normocephalic and atraumatic.  Eyes:     General: No scleral icterus.    Extraocular Movements: Extraocular movements intact.  Cardiovascular:     Rate and Rhythm: Normal rate and regular rhythm.     Pulses: Normal pulses.     Heart sounds: Normal heart sounds. No murmur heard. Pulmonary:     Breath sounds: Normal breath sounds. No wheezing or rales.  Genitourinary:    Penis: Normal.   Musculoskeletal:        General: Swelling (MCP and PIP joints of second digit of right hand) and tenderness (Left shoulder, anterior aspect) present.     Left shoulder: Decreased range of motion (due to pain).     Cervical back: Neck supple. No tenderness.     Right lower leg: No edema.     Left lower leg: No edema.  Skin:    General: Skin is warm.     Findings: No rash.  Neurological:     General: No focal deficit present.     Mental Status: He is alert and oriented to person, place, and time.     Sensory: No sensory deficit.     Motor: No weakness.  Psychiatric:  Mood and Affect: Mood normal.        Behavior: Behavior normal.     BP 128/84 (BP Location: Right Arm)   Pulse 86   Resp 16   Ht '6\' 2"'$  (1.88 m)   Wt 199 lb 6.4 oz (90.4 kg)   SpO2 97%   BMI 25.60 kg/m  Wt Readings from Last 3 Encounters:  01/07/22 199 lb 6.4 oz (90.4 kg)  10/05/21 197 lb (89.4 kg)  08/26/21 197 lb 12.8 oz (89.7 kg)        Assessment & Plan:   Problem List Items Addressed This Visit    Visit Diagnoses     Acute left-sided low back pain without  sciatica    -  Primary Could be tendinitis versus rotator cuff injury Unable to take NSAIDs due to cardiac history and anticoagulation Depo-Medrol IM today Prednisone 40 mg X 5 days Flexeril as needed for muscle spasms Given persistent, will refer to orthopedic surgeon for steroid injection in the joint   Relevant Medications   cyclobenzaprine (FLEXERIL) 5 MG tablet   predniSONE (DELTASONE) 20 MG tablet   methylPREDNISolone acetate (DEPO-MEDROL) injection 80 mg (Completed)   Need for immunization against influenza       Relevant Orders   Flu Vaccine QUAD 63moIM (Fluarix, Fluzone & Alfiuria Quad PF) (Completed)        Meds ordered this encounter  Medications   cyclobenzaprine (FLEXERIL) 5 MG tablet    Sig: Take 1 tablet (5 mg total) by mouth 3 (three) times daily as needed for muscle spasms.    Dispense:  30 tablet    Refill:  1   predniSONE (DELTASONE) 20 MG tablet    Sig: Take 2 tablets (40 mg total) by mouth daily with breakfast.    Dispense:  10 tablet    Refill:  0   methylPREDNISolone acetate (DEPO-MEDROL) injection 80 mg     RLindell Spar MD

## 2022-03-03 ENCOUNTER — Encounter: Payer: Self-pay | Admitting: Internal Medicine

## 2022-03-03 ENCOUNTER — Encounter: Payer: Self-pay | Admitting: *Deleted

## 2022-03-03 ENCOUNTER — Ambulatory Visit (INDEPENDENT_AMBULATORY_CARE_PROVIDER_SITE_OTHER): Payer: BC Managed Care – PPO | Admitting: Internal Medicine

## 2022-03-03 VITALS — BP 158/98 | HR 80 | Ht 74.0 in | Wt 200.4 lb

## 2022-03-03 DIAGNOSIS — I480 Paroxysmal atrial fibrillation: Secondary | ICD-10-CM | POA: Diagnosis not present

## 2022-03-03 DIAGNOSIS — R1901 Right upper quadrant abdominal swelling, mass and lump: Secondary | ICD-10-CM

## 2022-03-03 DIAGNOSIS — M79671 Pain in right foot: Secondary | ICD-10-CM

## 2022-03-03 DIAGNOSIS — Z125 Encounter for screening for malignant neoplasm of prostate: Secondary | ICD-10-CM | POA: Diagnosis not present

## 2022-03-03 DIAGNOSIS — E782 Mixed hyperlipidemia: Secondary | ICD-10-CM

## 2022-03-03 DIAGNOSIS — R739 Hyperglycemia, unspecified: Secondary | ICD-10-CM

## 2022-03-03 DIAGNOSIS — E349 Endocrine disorder, unspecified: Secondary | ICD-10-CM | POA: Insufficient documentation

## 2022-03-03 DIAGNOSIS — Z0001 Encounter for general adult medical examination with abnormal findings: Secondary | ICD-10-CM | POA: Diagnosis not present

## 2022-03-03 DIAGNOSIS — E559 Vitamin D deficiency, unspecified: Secondary | ICD-10-CM

## 2022-03-03 DIAGNOSIS — I10 Essential (primary) hypertension: Secondary | ICD-10-CM

## 2022-03-03 DIAGNOSIS — Z7901 Long term (current) use of anticoagulants: Secondary | ICD-10-CM

## 2022-03-03 DIAGNOSIS — Z1211 Encounter for screening for malignant neoplasm of colon: Secondary | ICD-10-CM

## 2022-03-03 MED ORDER — LOSARTAN POTASSIUM 25 MG PO TABS: 25.0000 mg | ORAL_TABLET | Freq: Every day | ORAL | 2 refills | Status: DC

## 2022-03-03 NOTE — Assessment & Plan Note (Signed)
Likely due to Planter fasciitis High arch also worsening his pain Rest, ice and brace as tolerated He has orthotic support for his shoes Has seen podiatrist in the past

## 2022-03-03 NOTE — Progress Notes (Signed)
Established Patient Office Visit  Subjective:  Patient ID: Donald [name], male    DOB: 1965-07-10  Age: 56 y.o. MRN: [id number]  CC:  Chief Complaint  Patient presents with   Annual Exam    CPE,R foot arch pain,Rib pain/bulging on R side since friday    HPI Donald [name] is a 56 y.o. male with past medical history of AR s/p AV replacement, nonischemic CM, HTN, paroxysmal A Fib, gout and HLD who presents for annual physical.  Right foot pain: He has pain over right heel area and over the arch for the last few weeks.  Pain is sharp and worse upon walking.  He has tried orthotic support for high arch, but has not been much helpful.  He has been evaluated by podiatrist in the past for high arch, but did not tolerate the orthotic support that was prescribed.  He currently denies any numbness or tingling of the foot.  RUQ mass: He has noted a mass over RUQ area since the last week.  He reports that he had a sore muscle on his right side of the back and while trying to move on right side few days ago, he had noticed a sharp pain on RUQ abdominal area and has noticed a mass since then (?).  Mass is nontender currently.  Denies any nausea or vomiting currently.  HTN: His BP was elevated today.  He attributes it to pain.  He takes metoprolol for history of NICM and paroxysmal A-fib. Patient denies headache, dizziness, chest pain, dyspnea or palpitations.  He is on metoprolol and Xarelto currently.     Past Medical History:  Diagnosis Date   Acute gout of left foot 10/03/2019   Alcohol use 06/27/2019   Bone tumor 11/22/2013   Cardiomyopathy ([place])    a. EF 30 to 35% by echo in 06/2019 in the setting of severe AI   COVID-19 04/16/2020   Encounter for screening for malignant neoplasm of colon 06/27/2019   Glenoid labral tear 10/27/2010   History of gout 06/27/2019   Hyperlipidemia    Patent foramen ovale    Rotator cuff tear, right 10/27/2010   S/P aortic valve replacement with bioprosthetic valve  08/09/2019   29 mm Edwards Inspiris Resilia stented bovine pericardial tissue valve   S/P patent foramen ovale closure 08/09/2019   Severe aortic insufficiency     Past Surgical History:  Procedure Laterality Date   AORTIC VALVE REPLACEMENT N/A 08/09/2019   Procedure: AORTIC VALVE REPLACEMENT (AVR) using Margaretha Sheffield Resilia 29 MM Aortic Valve.;  Surgeon: Rexene Alberts, MD;  Location: [place];  Service: Open Heart Surgery;  Laterality: N/A;   BACK SURGERY     multiple back surgery   Benign tumor     Left shin   BUBBLE STUDY  07/05/2019   Procedure: BUBBLE STUDY;  Surgeon: Satira Sark, MD;  Location: [facility];  Service: Cardiovascular;;   CARDIAC VALVE REPLACEMENT N/A    [employer]rees[employer] 02/19/2020   HERNIA REPAIR     REPAIR OF PATENT FORAMEN OVALE N/A 08/09/2019   Procedure: Closure Of Patent Foramen Ovale;  Surgeon: Rexene Alberts, MD;  Location: [place];  Service: Open Heart Surgery;  Laterality: N/A;   RIGHT/LEFT HEART CATH AND CORONARY ANGIOGRAPHY N/A 07/26/2019   Procedure: RIGHT/LEFT HEART CATH AND CORONARY ANGIOGRAPHY;  Surgeon: Burnell Blanks, MD;  Location: [facility];  Service: Cardiovascular;  Laterality: N/A;   SHOULDER SURGERY Left    SHOULDER  SURGERY Right    TEE WITHOUT CARDIOVERSION N/A 07/05/2019   Procedure: TRANSESOPHAGEAL ECHOCARDIOGRAM (TEE) WITH PROPOFOL;  Surgeon: Satira Sark, MD;  Location: [facility];  Service: Cardiovascular;  Laterality: N/A;   TEE WITHOUT CARDIOVERSION N/A 08/09/2019   Procedure: TRANSESOPHAGEAL ECHOCARDIOGRAM (TEE);  Surgeon: Rexene Alberts, MD;  Location: [place];  Service: Open Heart Surgery;  Laterality: N/A;   Thumb surgery Left x3   VASECTOMY N/A    Phreesia 02/19/2020    Family History  Problem Relation Age of Onset   Other Father        Kidney    Social History   Socioeconomic History   Marital status: Married    Spouse name: [name]    Number of children: 4   Years of education: Not on file   Highest  education level: Some college, no degree  Occupational History   Occupation: Nurse, adult: [employer]  Tobacco Use   Smoking status: Every Day    Packs/day: 0.50    Types: Cigarettes    Last attempt to quit: 08/02/2019    Years since quitting: 2.5   Smokeless tobacco: Never   Tobacco comments:    Cutting down < 5 cig / day  Vaping Use   Vaping Use: Never used  Substance and Sexual Activity   Alcohol use: Yes    Alcohol/week: 12.0 standard drinks of alcohol    Types: 12 Shots of liquor per week    Comment: on Friday and Saturday   Drug use: No   Sexual activity: Yes    Birth control/protection: Surgical  Other Topics Concern   Not on file  Social History Narrative   Live [name] wife of 2 years, previously was widowed from first wife from cancer 01/2011      Four children      Enjoy: golf, target shooting       Diet:    Caffeine: 16-20 oz coffee    Water: 6-7 bottles       Wears seat belt    Smoke detectors at home    Does not use phone while driving    Social Determinants of Health   Financial Resource Strain: Low Risk  (02/20/2020)   Overall Financial Resource Strain (CARDIA)    Difficulty of Paying Living Expenses: Not hard at all  Food Insecurity: No Food Insecurity (02/20/2020)   Hunger Vital Sign    Worried About [place] in the Last Year: Never true    [place] in the Last Year: Never true  Transportation Needs: No Transportation Needs (02/20/2020)   PRAPARE - Hydrologist (Medical): No    Lack of Transportation (Non-Medical): No  Physical Activity: Sufficiently Active (02/20/2020)   Exercise Vital Sign    Days of Exercise per Week: 5 days    Minutes of Exercise per Session: 50 min  Stress: No Stress Concern Present (02/20/2020)   [place]    Feeling of Stress : Not at all  Social Connections: Moderately Isolated  (02/20/2020)   Social Connection and Isolation Panel [NHANES]    Frequency of Communication with Friends and Family: More than three times a week    Frequency of Social Gatherings with Friends and Family: Once a week    Attends Religious Services: Never    Marine scientist or Organizations: No    Attends Archivist Meetings:  Never    Marital Status: Married  Human resources officer Violence: Not At Risk (02/20/2020)   Humiliation, Afraid, Rape, and Kick questionnaire    Fear of Current or Ex-Partner: No    Emotionally Abused: No    Physically Abused: No    Sexually Abused: No    Outpatient Medications Prior to Visit  Medication Sig Dispense Refill   allopurinol (ZYLOPRIM) 100 MG tablet TAKE 1 TABLET BY MOUTH EVERY DAY 90 tablet 2   atorvastatin (LIPITOR) 10 MG tablet TAKE 1 TABLET BY MOUTH EVERY DAY 90 tablet 1   Cholecalciferol (VITAMIN D3) 50 MCG (2000 UT) TABS Take 2,000 Units by mouth daily.     Coenzyme Q10 (COQ10) 100 MG CAPS Take 100 mg by mouth daily.     diclofenac Sodium (VOLTAREN) 1 % GEL Apply 4 g topically 4 (four) times daily. 50 g 0   ketoconazole (NIZORAL) 2 % cream Apply 1 application topically daily. 15 g 0   metoprolol succinate (TOPROL-XL) 50 MG 24 hr tablet TAKE 1 TABLET BY MOUTH DAILY. TAKE WITH OR IMMEDIATELY FOLLOWING A MEAL. 90 tablet 3   Multiple Vitamin (MULTIVITAMIN WITH MINERALS) TABS tablet Take 1 tablet by mouth daily.     Omega-3 Fatty Acids (FISH OIL PO) Take 2,000 mg by mouth daily.      omeprazole (PRILOSEC) 20 MG capsule Take 1 capsule (20 mg total) by mouth daily. 7 capsule 0   rivaroxaban (XARELTO) 20 MG TABS tablet TAKE 1 TABLET BY MOUTH DAILY WITH SUPPER 30 tablet 5   cyclobenzaprine (FLEXERIL) 5 MG tablet Take 1 tablet (5 mg total) by mouth 3 (three) times daily as needed for muscle spasms. (Patient not taking: Reported on 03/03/2022) 30 tablet 1   predniSONE (DELTASONE) 20 MG tablet Take 2 tablets (40 mg total) by mouth daily with  breakfast. (Patient not taking: Reported on 03/03/2022) 10 tablet 0   No facility-administered medications prior to visit.    Allergies  Allergen Reactions   Ultram [Tramadol Hcl] Itching    "Tunnel vision" warm feeling all over and wanted to jump out of his skin    ROS Review of Systems  Constitutional:  Negative for chills and fever.  HENT:  Negative for congestion, sinus pressure and sinus pain.   Eyes:  Negative for pain and discharge.  Respiratory:  Negative for cough and shortness of breath.   Cardiovascular:  Negative for chest pain and palpitations.  Gastrointestinal:  Negative for diarrhea, nausea and vomiting.  Genitourinary:  Negative for dysuria and hematuria.  Musculoskeletal:  Positive for arthralgias and neck pain. Negative for neck stiffness.       Right foot pain  Skin:  Negative for rash.  Neurological:  Negative for dizziness and weakness.  Psychiatric/Behavioral:  Negative for agitation and behavioral problems.       Objective:    Physical Exam Vitals reviewed.  Constitutional:      General: He is not in acute distress.    Appearance: He is not diaphoretic.  HENT:     Head: Normocephalic and atraumatic.  Eyes:     General: No scleral icterus.    Extraocular Movements: Extraocular movements intact.  Cardiovascular:     Rate and Rhythm: Normal rate and regular rhythm.     Pulses: Normal pulses.     Heart sounds: Normal heart sounds. No murmur heard. Pulmonary:     Breath sounds: Normal breath sounds. No wheezing or rales.  Abdominal:     Palpations: Abdomen is soft.  There is mass (RUQ, about 7-8 cm in diameter, nontender).     Tenderness: There is no abdominal tenderness.  Genitourinary:    Penis: Normal.   Musculoskeletal:        General: Swelling (MCP and PIP joints of second digit of right hand) and tenderness (Right heel) present.     Cervical back: Neck supple. No tenderness.     Right lower leg: No edema.     Left lower leg: No edema.   Skin:    General: Skin is warm.     Findings: No rash.  Neurological:     General: No focal deficit present.     Mental Status: He is alert and oriented to person, place, and time.     Sensory: No sensory deficit.     Motor: No weakness.  Psychiatric:        Mood and Affect: Mood normal.        Behavior: Behavior normal.     BP (!) 158/98 (BP Location: Left Arm, Cuff Size: Normal)   Pulse 80   Ht _0  (1.88 m)   Wt 200 lb 6.4 oz (90.9 kg)   SpO2 91%   BMI 25.73 kg/m  Wt Readings from Last 3 Encounters:  03/03/22 200 lb 6.4 oz (90.9 kg)  01/07/22 199 lb 6.4 oz (90.4 kg)  10/05/21 197 lb (89.4 kg)    Lab Results  Component Value Date   TSH 1.540 02/26/2021   Lab Results  Component Value Date   WBC 7.2 02/26/2021   HGB 15.2 02/26/2021   HCT 44.1 02/26/2021   MCV 93 02/26/2021   PLT 177 02/26/2021   Lab Results  Component Value Date   NA 138 02/26/2021   K 4.5 02/26/2021   CO2 23 02/26/2021   GLUCOSE 94 02/26/2021   BUN 20 02/26/2021   CREATININE 0.91 02/26/2021   BILITOT 0.4 02/26/2021   ALKPHOS 83 02/26/2021   AST 31 02/26/2021   ALT 33 02/26/2021   PROT 6.6 02/26/2021   ALBUMIN 4.4 02/26/2021   CALCIUM 9.2 02/26/2021   ANIONGAP 6 05/21/2020   EGFR 101 02/26/2021   Lab Results  Component Value Date   CHOL 189 02/26/2021   Lab Results  Component Value Date   HDL 51 02/26/2021   Lab Results  Component Value Date   LDLCALC 84 02/26/2021   Lab Results  Component Value Date   TRIG 335 (H) 02/26/2021   Lab Results  Component Value Date   CHOLHDL 3.7 02/26/2021   Lab Results  Component Value Date   HGBA1C 5.3 02/26/2021      Assessment & Plan:   Problem List Items Addressed This Visit       Cardiovascular and Mediastinum   Essential hypertension    BP Readings from Last 1 Encounters:  03/03/22 (!) 158/98  Uncontrolled with Metoprolol Added Losartan 25 mg QD Counseled for compliance with the medications Advised DASH diet and  moderate exercise/walking, at least 150 mins/week      Relevant Medications   losartan (COZAAR) 25 MG tablet   Other Relevant Orders   CMP14+EGFR   CBC with Differential/Platelet   Paroxysmal atrial fibrillation (HCC)    Rate controlled with metoprolol On Xarelto for AC Followed by Cardiology      Relevant Medications   losartan (COZAAR) 25 MG tablet   Other Relevant Orders   TSH   CMP14+EGFR   CBC with Differential/Platelet     Other   Hyperlipidemia  On statin      Relevant Medications   losartan (COZAAR) 25 MG tablet   Other Relevant Orders   Lipid panel   Vitamin D deficiency   Relevant Orders   VITAMIN D 25 Hydroxy (Vit-D Deficiency, Fractures)   Encounter for general adult medical examination with abnormal findings - Primary    Annual exam as documented. Counseling done  re healthy lifestyle involving commitment to 150 minutes exercise per week, heart healthy diet, and attaining healthy weight.The importance of adequate sleep also discussed. Changes in health habits are decided on by the patient with goals and time frames  set for achieving them. Immunization and cancer screening needs are specifically addressed at this visit.      Screening for prostate cancer    Ordered PSA after discussing its limitations for prostate cancer screening, including false positive results leading additional investigations.      RUQ abdominal mass    Unclear etiology, but less likely from movement Could be lipoma as it is nontender, although would not be sudden onset Check [place] of abdomen      Relevant Orders   US Abdomen Limited   Pain of right heel    Likely due to Planter fasciitis High arch also worsening his pain Rest, ice and brace as tolerated He has orthotic support for his shoes Has seen podiatrist in the past      Testosterone deficiency    Reports mild fatigue Check TSH, total and free testosterone      Relevant Orders   Testosterone,Free and Total    Other Visit Diagnoses     Chronic anticoagulation       Relevant Orders   CBC with Differential/Platelet   Hyperglycemia       Relevant Orders   Hemoglobin A1c   Colon cancer screening       Relevant Orders   Ambulatory referral to Gastroenterology       Meds ordered this encounter  Medications   losartan (COZAAR) 25 MG tablet    Sig: Take 1 tablet (25 mg total) by mouth daily.    Dispense:  30 tablet    Refill:  2    Follow-up: Return in about 4 weeks (around 03/31/2022) for HTN.    Lindell Spar, MD

## 2022-03-03 NOTE — Assessment & Plan Note (Signed)
Ordered PSA after discussing its limitations for prostate cancer screening, including false positive results leading additional investigations. 

## 2022-03-03 NOTE — Assessment & Plan Note (Signed)
Reports mild fatigue Check TSH, total and free testosterone

## 2022-03-03 NOTE — Assessment & Plan Note (Signed)
Unclear etiology, but less likely from movement Could be lipoma as it is nontender, although would not be sudden onset Check [place] of abdomen

## 2022-03-03 NOTE — Patient Instructions (Addendum)
Please start taking Losartan 25 mg once daily.  Please check BP once daily and bring the log in the next visit.  Please continue taking other medications as prescribed.  Please get fasting blood tests after 2 weeks.  Please follow DASH diet and perform moderate exercise/walking at least 150 mins/week.

## 2022-03-03 NOTE — Assessment & Plan Note (Signed)
On statin.

## 2022-03-03 NOTE — Assessment & Plan Note (Signed)
Annual exam as documented. Counseling done  re healthy lifestyle involving commitment to 150 minutes exercise per week, heart healthy diet, and attaining healthy weight.The importance of adequate sleep also discussed. Changes in health habits are decided on by the patient with goals and time frames  set for achieving them. Immunization and cancer screening needs are specifically addressed at this visit. 

## 2022-03-03 NOTE — Assessment & Plan Note (Signed)
BP Readings from Last 1 Encounters:  03/03/22 (!) 158/98   Uncontrolled with Metoprolol Added Losartan 25 mg QD Counseled for compliance with the medications Advised DASH diet and moderate exercise/walking, at least 150 mins/week

## 2022-03-03 NOTE — Assessment & Plan Note (Signed)
Rate controlled with metoprolol On Xarelto for AC Followed by Cardiology 

## 2022-03-04 ENCOUNTER — Telehealth: Payer: Self-pay | Admitting: Internal Medicine

## 2022-03-04 ENCOUNTER — Other Ambulatory Visit: Payer: Self-pay | Admitting: Internal Medicine

## 2022-03-04 ENCOUNTER — Ambulatory Visit (HOSPITAL_COMMUNITY)
Admission: RE | Admit: 2022-03-04 | Discharge: 2022-03-04 | Disposition: A | Discharge status: Home / Self Care | Payer: BC Managed Care – PPO | Source: Ambulatory Visit | Attending: Internal Medicine | Admitting: Internal Medicine

## 2022-03-04 DIAGNOSIS — M79671 Pain in right foot: Secondary | ICD-10-CM

## 2022-03-04 DIAGNOSIS — R2241 Localized swelling, mass and lump, right lower limb: Secondary | ICD-10-CM

## 2022-03-04 DIAGNOSIS — M7731 Calcaneal spur, right foot: Secondary | ICD-10-CM

## 2022-03-04 MED ORDER — HYDROCODONE-ACETAMINOPHEN 5-325 MG PO TABS: 1.0000 | ORAL_TABLET | Freq: Four times a day (QID) | ORAL | 0 refills | Status: DC | PRN

## 2022-03-04 NOTE — Telephone Encounter (Signed)
Spoke to patient about X-ray results. Sent Urgent referral to Podiatry.

## 2022-03-04 NOTE — Telephone Encounter (Signed)
Patient called in regard to foot Pt had appt 11/8 where foot pain and swelling was discussed   Patient was moving about in home yesterday evening and heard a pooping sound and foot is now 2x worse than before. Wants to know if an xray order can be placed.   Patient wants a call back in regard.

## 2022-03-04 NOTE — Telephone Encounter (Signed)
Patient aware.

## 2022-03-07 ENCOUNTER — Ambulatory Visit (INDEPENDENT_AMBULATORY_CARE_PROVIDER_SITE_OTHER): Payer: BC Managed Care – PPO

## 2022-03-07 ENCOUNTER — Ambulatory Visit
Admission: EM | Admit: 2022-03-07 | Discharge: 2022-03-07 | Disposition: A | Discharge status: Home / Self Care | Payer: BC Managed Care – PPO | Attending: Nurse Practitioner | Admitting: Nurse Practitioner

## 2022-03-07 ENCOUNTER — Encounter: Payer: Self-pay | Admitting: *Deleted

## 2022-03-07 DIAGNOSIS — M25471 Effusion, right ankle: Secondary | ICD-10-CM | POA: Diagnosis not present

## 2022-03-07 DIAGNOSIS — M25571 Pain in right ankle and joints of right foot: Secondary | ICD-10-CM | POA: Diagnosis not present

## 2022-03-07 DIAGNOSIS — M109 Gout, unspecified: Secondary | ICD-10-CM

## 2022-03-07 HISTORY — DX: Other specified soft tissue disorders: M79.89

## 2022-03-07 MED ORDER — PREDNISONE 20 MG PO TABS: 40.0000 mg | ORAL_TABLET | Freq: Every day | ORAL | 0 refills | Status: AC

## 2022-03-07 NOTE — ED Triage Notes (Addendum)
C/O starting with right foot "arch pain" without injury approx 2 wks ago; started wearing arch support; pain had continued so he saw PCP 4 days ago; 3 days ago felt a "pop" - had XR done - told "soft tissue mass"; starting brace for plantar fasciitis since yesterday morning. Started with right ankle pain, significant swelling, redness 2 days ago - states "foot isn't even bothering me anymore". Right lateral ankle erythema noted with faint red streak extending up ankle. Has been taking Tyl; states PCP sent Rx for Vicodin, but hasn't been able to fill yet.

## 2022-03-07 NOTE — ED Notes (Signed)
Ice pack to right ankle provided for comfort.

## 2022-03-07 NOTE — Discharge Instructions (Addendum)
The x-rays are negative for fracture or dislocation.  Blood work has been collected to check your uric acid level.  If the results are abnormal, you will be contacted. As discussed, symptoms appear to be consistent with gout.  We will treat with prednisone 40 mg for 5 days.  You can continue the allopurinol you are currently taking. Dietary changes are recommended when you have gout.  I am providing a list of resources of foods to help prevent gout flares. Increase fluids and allow for plenty of rest. Recommend ice and elevation to the right ankle while symptoms improve. May take Tylenol as needed for right ankle pain. As discussed, please follow-up with your primary care physician for reevaluation within the next 5 to 7 days or sooner if symptoms do not improve. Follow-up as needed.

## 2022-03-07 NOTE — ED Provider Notes (Signed)
[facility]    CSN: [id number] Arrival date & time: 03/07/22  0813      History   Chief Complaint Chief Complaint  Patient presents with   Ankle Pain    HPI Donald [name] is a 56 y.o. male.   The history is provided by the patient.   Patient presents for complaints of right ankle pain that been present over the last 2 to 3 days.  Patient states he was seen earlier last week for pain in his right foot.  Patient states that he ended up having x-rays ordered by his PCP because he felt a "pop" in the right foot.  He states that he was also given a device to use at nighttime for the right foot pain and also has purchased new insoles for the right foot.  Since that time, he states that he has developed swelling and pain to the outer portion of his right ankle.  He states the pain is 10/10 at present.  He states that the joint is swollen and very tender to touch.  He does have a history of gout.  Reports that he takes allopurinol daily for his symptoms.  Last episode of gout was approximately 3 years ago.  He denies any known injury or trauma to the right ankle.  Past Medical History:  Diagnosis Date   Acute gout of left foot 10/03/2019   Alcohol use 06/27/2019   Bone tumor 11/22/2013   Cardiomyopathy ([place])    a. EF 30 to 35% by echo in 06/2019 in the setting of severe AI   COVID-19 04/16/2020   Encounter for screening for malignant neoplasm of colon 06/27/2019   Glenoid labral tear 10/27/2010   History of gout 06/27/2019   Hyperlipidemia    Mass of soft tissue of foot    Patent foramen ovale    Rotator cuff tear, right 10/27/2010   S/P aortic valve replacement with bioprosthetic valve 08/09/2019   29 mm Edwards Inspiris Resilia stented bovine pericardial tissue valve   S/P patent foramen ovale closure 08/09/2019   Severe aortic insufficiency     Patient Active Problem List   Diagnosis Date Noted   RUQ abdominal mass 03/03/2022   Pain of right heel 03/03/2022    Testosterone deficiency 03/03/2022   Paroxysmal atrial fibrillation ([place]) 08/26/2021   Nonischemic cardiomyopathy ([place]) 08/26/2021   Acute idiopathic gout of multiple sites 08/26/2021   Encounter for general adult medical examination with abnormal findings 02/26/2021   DDD (degenerative disc disease), cervical 02/26/2021   Screening for prostate cancer 02/26/2021   Suprapatellar bursitis of right knee 02/26/2021   Nicotine abuse 02/20/2020   S/P aortic valve replacement with bioprosthetic valve 08/09/2019   S/P patent foramen ovale closure 08/09/2019   S/P AVR (aortic valve replacement) 08/09/2019   Hyperlipidemia 06/27/2019   Overweight (BMI 25.0-29.9) 06/27/2019   Essential hypertension 06/27/2019   Vitamin D deficiency 06/27/2019   RBBB (right bundle branch block) 06/27/2019   LVH (left ventricular hypertrophy) 06/27/2019    Past Surgical History:  Procedure Laterality Date   AORTIC VALVE REPLACEMENT N/A 08/09/2019   Procedure: AORTIC VALVE REPLACEMENT (AVR) using Margaretha Sheffield Resilia 29 MM Aortic Valve.;  Surgeon: Rexene Alberts, MD;  Location: [place];  Service: Open Heart Surgery;  Laterality: N/A;   BACK SURGERY     multiple back surgery   Benign tumor     Left shin   BUBBLE STUDY  07/05/2019   Procedure: BUBBLE STUDY;  Surgeon:  Satira Sark, MD;  Location: [facility];  Service: Cardiovascular;;   CARDIAC VALVE REPLACEMENT N/A    Phreesia 02/19/2020   HERNIA REPAIR     REPAIR OF PATENT FORAMEN OVALE N/A 08/09/2019   Procedure: Closure Of Patent Foramen Ovale;  Surgeon: Rexene Alberts, MD;  Location: [place];  Service: Open Heart Surgery;  Laterality: N/A;   RIGHT/LEFT HEART CATH AND CORONARY ANGIOGRAPHY N/A 07/26/2019   Procedure: RIGHT/LEFT HEART CATH AND CORONARY ANGIOGRAPHY;  Surgeon: Burnell Blanks, MD;  Location: [facility];  Service: Cardiovascular;  Laterality: N/A;   SHOULDER SURGERY Left    SHOULDER SURGERY Right    TEE WITHOUT CARDIOVERSION  N/A 07/05/2019   Procedure: TRANSESOPHAGEAL ECHOCARDIOGRAM (TEE) WITH PROPOFOL;  Surgeon: Satira Sark, MD;  Location: [facility];  Service: Cardiovascular;  Laterality: N/A;   TEE WITHOUT CARDIOVERSION N/A 08/09/2019   Procedure: TRANSESOPHAGEAL ECHOCARDIOGRAM (TEE);  Surgeon: Rexene Alberts, MD;  Location: [place];  Service: Open Heart Surgery;  Laterality: N/A;   Thumb surgery Left x3   VASECTOMY N/A    Phreesia 02/19/2020       Home Medications    Prior to Admission medications   Medication Sig Start Date End Date Taking? Authorizing Provider  allopurinol (ZYLOPRIM) 100 MG tablet TAKE 1 TABLET BY MOUTH EVERY DAY 10/26/21  Yes Lindell Spar, MD  atorvastatin (LIPITOR) 10 MG tablet TAKE 1 TABLET BY MOUTH EVERY DAY 12/07/21  Yes Lindell Spar, MD  Cholecalciferol (VITAMIN D3) 50 MCG (2000 UT) TABS Take 2,000 Units by mouth daily.   Yes [provider]  Coenzyme Q10 (COQ10) 100 MG CAPS Take 100 mg by mouth daily.   Yes [provider]  metoprolol succinate (TOPROL-XL) 50 MG 24 hr tablet TAKE 1 TABLET BY MOUTH DAILY. TAKE WITH OR IMMEDIATELY FOLLOWING A MEAL. 07/03/21  Yes Satira Sark, MD  Multiple Vitamin (MULTIVITAMIN WITH MINERALS) TABS tablet Take 1 tablet by mouth daily.   Yes [provider]  Omega-3 Fatty Acids (FISH OIL PO) Take 2,000 mg by mouth daily.    Yes [provider]  predniSONE (DELTASONE) 20 MG tablet Take 2 tablets (40 mg total) by mouth daily with breakfast for 5 days. 03/07/22 03/12/22 Yes Tyshae Stair-Warren, Alda Lea, NP  rivaroxaban (XARELTO) 20 MG TABS tablet TAKE 1 TABLET BY MOUTH DAILY WITH SUPPER 12/07/21  Yes Satira Sark, MD  diclofenac Sodium (VOLTAREN) 1 % GEL Apply 4 g topically 4 (four) times daily. 02/26/21   Lindell Spar, MD  HYDROcodone-acetaminophen (NORCO) 5-325 MG tablet Take 1 tablet by mouth every 6 (six) hours as needed for moderate pain. 03/04/22   Lindell Spar, MD  ketoconazole (NIZORAL) 2 % cream  Apply 1 application topically daily. 11/28/20   Lindell Spar, MD  losartan (COZAAR) 25 MG tablet Take 1 tablet (25 mg total) by mouth daily. 03/03/22   Lindell Spar, MD  omeprazole (PRILOSEC) 20 MG capsule Take 1 capsule (20 mg total) by mouth daily. 08/26/21   Lindell Spar, MD    Family History Family History  Problem Relation Age of Onset   Other Father        Kidney    Social History Social History   Tobacco Use   Smoking status: Every Day    Packs/day: 0.50    Types: Cigarettes    Last attempt to quit: 08/02/2019    Years since quitting: 2.5   Smokeless tobacco: Never  Tobacco comments:    Cutting down < 5 cig / day  Vaping Use   Vaping Use: Never used  Substance Use Topics   Alcohol use: Not Currently    Comment: "a little wine"   Drug use: No     Allergies   Ultram [tramadol hcl]   Review of Systems Review of Systems Per HPI  Physical Exam Triage Vital Signs ED Triage Vitals  Enc Vitals Group     BP 03/07/22 0827 (!) 158/111     Pulse Rate 03/07/22 0827 91     Resp 03/07/22 0827 20     Temp 03/07/22 0827 98.2 F (36.8 C)     Temp Source 03/07/22 0827 Oral     SpO2 03/07/22 0827 99 %     Weight --      Height --      Head Circumference --      Peak Flow --      Pain Score 03/07/22 0829 10     Pain Loc --      Pain Edu? --      Excl. in [place]? --    No data found.  Updated Vital Signs BP (!) 165/100   Pulse 91   Temp 98.2 F (36.8 C) (Oral)   Resp 20   SpO2 99%   Visual Acuity Right Eye Distance:   Left Eye Distance:   Bilateral Distance:    Right Eye Near:   Left Eye Near:    Bilateral Near:     Physical Exam Vitals and nursing note reviewed.  Constitutional:      General: He is not in acute distress.    Appearance: Normal appearance.  Musculoskeletal:     Right ankle: Swelling present. Tenderness present over the lateral malleolus. Decreased range of motion. Normal pulse.     Comments: Erythema noted to the lateral malleolus  with warmth to palpation.   Skin:    General: Skin is warm and dry.  Neurological:     General: No focal deficit present.     Mental Status: He is alert and oriented to person, place, and time.  Psychiatric:        Mood and Affect: Mood normal.        Behavior: Behavior normal.      UC Treatments / Results  Labs (all labs ordered are listed, but only abnormal results are displayed) Labs Reviewed  URIC ACID    EKG   Radiology DG Ankle Complete Right  Result Date: 03/07/2022 CLINICAL DATA:  Right ankle pain and swelling EXAM: RIGHT ANKLE - COMPLETE 3+ VIEW COMPARISON:  Multiple exams, including 03/04/2022 foot radiographs FINDINGS: Soft tissue swelling overlying both malleoli. Ill-defined calcification lateral to the distal fibula, not present on prior radiographs from 11/01/2006. Small corticated ossicles below the medial malleolus. Plafond talar dome appear intact. Suspected tibiotalar joint effusion anteriorly. Mild dorsal spurring along the navicular. Small plantar calcaneal spur. IMPRESSION: 1. Ill-defined calcification lateral to the distal fibula, not present on prior radiographs from 11/01/2006. This could be from local heterotopic ossification although strictly speaking an underlying mass is not excluded. Consider dedicated hindfoot/ankle MRI. 2. Soft tissue swelling overlying both malleoli. 3. Suspected tibiotalar joint effusion. 4. Small plantar calcaneal spur. Electronically Signed   By: Van Clines M.D.   On: 03/07/2022 09:26    Procedures Procedures (including critical care time)  Medications Ordered in UC Medications - No data to display  Initial Impression / Assessment and Plan / UC  Course  I have reviewed the triage vital signs and the nursing notes.  Pertinent labs & imaging results that were available during my care of the patient were reviewed by me and considered in my medical decision making (see chart for details).  Patient presents with a 2-day  history of right ankle pain, warmth, and swelling.  Patient has a history of gout, with his last episode approximately 3 years ago.  X-ray is negative for fracture or dislocation, but suggest an MRI of the hindfoot and ankle..  Symptoms are consistent with gout flare.  Uric acid level is pending at this time.  Prednisone 40 mg was prescribed for gout of right ankle.  Patient was advised that he may continue the allopurinol as needed.  Supportive care recommendations were provided to the patient to include the use of Tylenol for pain or discomfort, ice and elevation.  Advised patient to follow-up with his primary care physician for reevaluation within the next 5 to 7 days or sooner if symptoms worsen.  Discussed findings of the x-ray with the patient and suggestions provided by the radiologist.  Ace wrap was also provided for compression and support.  Patient verbalizes understanding.  All questions were answered.  Patient is stable for discharge. Final Clinical Impressions(s) / UC Diagnoses   Final diagnoses:  Right ankle swelling  Gout of right ankle, unspecified cause, unspecified chronicity     Discharge Instructions      The x-rays are negative for fracture or dislocation.  Blood work has been collected to check your uric acid level.  If the results are abnormal, you will be contacted. As discussed, symptoms appear to be consistent with gout.  We will treat with prednisone 40 mg for 5 days.  You can continue the allopurinol you are currently taking. Dietary changes are recommended when you have gout.  I am providing a list of resources of foods to help prevent gout flares. Increase fluids and allow for plenty of rest. Recommend ice and elevation to the right ankle while symptoms improve. May take Tylenol as needed for right ankle pain. As discussed, please follow-up with your primary care physician for reevaluation within the next 5 to 7 days or sooner if symptoms do not improve. Follow-up as  needed.     ED Prescriptions     Medication Sig Dispense Auth. Provider   predniSONE (DELTASONE) 20 MG tablet Take 2 tablets (40 mg total) by mouth daily with breakfast for 5 days. 10 tablet Emeline Simpson-Warren, Alda Lea, NP      PDMP not reviewed this encounter.   Tish Men, NP 03/07/22 [phone]

## 2022-03-09 ENCOUNTER — Telehealth (INDEPENDENT_AMBULATORY_CARE_PROVIDER_SITE_OTHER): Payer: BC Managed Care – PPO | Admitting: Internal Medicine

## 2022-03-09 DIAGNOSIS — G8929 Other chronic pain: Secondary | ICD-10-CM | POA: Insufficient documentation

## 2022-03-09 DIAGNOSIS — M25571 Pain in right ankle and joints of right foot: Secondary | ICD-10-CM

## 2022-03-09 DIAGNOSIS — R2241 Localized swelling, mass and lump, right lower limb: Secondary | ICD-10-CM

## 2022-03-09 LAB — URIC ACID: Uric Acid: 4.3 mg/dL (ref 3.8–8.4)

## 2022-03-09 NOTE — Assessment & Plan Note (Addendum)
This is a video encounter for right ankle pain. Patient seen in clinic for right heel pain on 11/8 and noted to present as plantar fascitis. Pain worsened at home and he heard a popping sound coming from his ankle. He was sent for an xray and results were discussed with patient. Referred to podiatry. Patient had increased swelling and erythema over the weekend . He went to urgent care and repeat xray was done. There was new soft tissue swelling overlying both malleoli and tibiotalar joint effusion. Small plantar calcaneal spur also noted on first xray. Recommendations on xrays to follow up with MRI to rule out mass. Clinically noted to fit a gout flair at ED and started on prednisone. Patient asked about uric acid level being normal, and we discussed serum uric acid levels are lower during flair. Patient has had slight improvement, but continues to have severe pain 8/10 at times. Patient 15 tablets of Norco 5-325 for pain control after recent office visit.   Assessment/Plan: Right ankle pain. Etiology is unclear and limited evaluation given video visit today. No fracture seen on xray. Patient could have a gout flair, tendon subluxation, or underlying mass. MRI (stat) ordered for better evaluation.Continue pain control prescribed and is on light duty at work.

## 2022-03-09 NOTE — Progress Notes (Signed)
     This is a video encounter between [employer] and Lorene Dy on 03/09/2022 for right ankle pain. The visit was conducted with the patient located at  work  and Lorene Dy at [facility]. The patient's identity was confirmed using 2 identifiers. The patient has consented to being evaluated through a video encounter and understands the associated risks (an examination cannot be done and the patient may need to come in for an appointment) / benefits (allows the patient to remain at home, decreasing exposure to coronavirus).    CC: right ankle pain  HPI:Mr.CINCERE ZORN is a 56 y.o. male who presents for evaluation of right ankle pain. For the details of today's visit, please refer to the assessment and plan.   Past Medical History:  Diagnosis Date   Acute gout of left foot 10/03/2019   Alcohol use 06/27/2019   Bone tumor 11/22/2013   Cardiomyopathy ([place])    a. EF 30 to 35% by echo in 06/2019 in the setting of severe AI   COVID-19 04/16/2020   Encounter for screening for malignant neoplasm of colon 06/27/2019   Glenoid labral tear 10/27/2010   History of gout 06/27/2019   Hyperlipidemia    Mass of soft tissue of foot    Patent foramen ovale    Rotator cuff tear, right 10/27/2010   S/P aortic valve replacement with bioprosthetic valve 08/09/2019   29 mm Edwards Inspiris Resilia stented bovine pericardial tissue valve   S/P patent foramen ovale closure 08/09/2019   Severe aortic insufficiency      Assessment & Plan:   Right ankle pain This is a video encounter for right ankle pain. Patient seen in clinic for right heel pain on 11/8 and noted to present as plantar fascitis. Pain worsened at home and he heard a popping sound coming from his ankle. He was sent for an xray and results were discussed with patient. Referred to podiatry. Patient had increased swelling and erythema over the weekend . He went to urgent care and repeat xray was done. There was new soft tissue swelling  overlying both malleoli and tibiotalar joint effusion. Small plantar calcaneal spur also noted on first xray. Recommendations on xrays to follow up with MRI to rule out mass. Clinically noted to fit a gout flair at ED and started on prednisone. Patient asked about uric acid level being normal, and we discussed serum uric acid levels are lower during flair. Patient has had slight improvement, but continues to have severe pain 8/10 at times. Patient 15 tablets of Norco 5-325 for pain control after recent office visit.   Assessment/Plan: Right ankle pain. Etiology is unclear and limited evaluation given video visit today. No fracture seen on xray. Patient could have a gout flair, tendon subluxation, or underlying mass. MRI (stat) ordered for better evaluation.Continue pain control prescribed and is on light duty at work.    Time:   Today, I have spent 20 minutes reviewing the chart, including problem list, medications, and with the patient with telehealth technology discussing the above problems.  Lorene Dy, MD

## 2022-03-11 ENCOUNTER — Ambulatory Visit (HOSPITAL_COMMUNITY)
Admission: RE | Admit: 2022-03-11 | Discharge: 2022-03-11 | Disposition: A | Discharge status: Home / Self Care | Payer: BC Managed Care – PPO | Source: Ambulatory Visit | Attending: Internal Medicine | Admitting: Internal Medicine

## 2022-03-11 DIAGNOSIS — R2241 Localized swelling, mass and lump, right lower limb: Secondary | ICD-10-CM | POA: Insufficient documentation

## 2022-03-11 DIAGNOSIS — R1901 Right upper quadrant abdominal swelling, mass and lump: Secondary | ICD-10-CM | POA: Insufficient documentation

## 2022-03-11 DIAGNOSIS — S93491A Sprain of other ligament of right ankle, initial encounter: Secondary | ICD-10-CM | POA: Diagnosis not present

## 2022-03-11 DIAGNOSIS — M65871 Other synovitis and tenosynovitis, right ankle and foot: Secondary | ICD-10-CM | POA: Diagnosis not present

## 2022-03-11 DIAGNOSIS — S86311A Strain of muscle(s) and tendon(s) of peroneal muscle group at lower leg level, right leg, initial encounter: Secondary | ICD-10-CM | POA: Diagnosis not present

## 2022-03-11 DIAGNOSIS — M25471 Effusion, right ankle: Secondary | ICD-10-CM | POA: Diagnosis not present

## 2022-03-11 MED ORDER — GADOBUTROL 1 MMOL/ML IV SOLN
10.0000 mL | Freq: Once | INTRAVENOUS | Status: AC | PRN
  Administered 2022-03-11: 10 mL via INTRAVENOUS

## 2022-03-12 ENCOUNTER — Telehealth: Payer: Self-pay | Admitting: Podiatry

## 2022-03-12 ENCOUNTER — Ambulatory Visit (HOSPITAL_COMMUNITY)
Admission: RE | Admit: 2022-03-12 | Discharge: 2022-03-12 | Disposition: A | Discharge status: Home / Self Care | Payer: BC Managed Care – PPO | Source: Ambulatory Visit | Attending: Internal Medicine | Admitting: Internal Medicine

## 2022-03-12 DIAGNOSIS — R2241 Localized swelling, mass and lump, right lower limb: Secondary | ICD-10-CM | POA: Diagnosis not present

## 2022-03-12 DIAGNOSIS — R1901 Right upper quadrant abdominal swelling, mass and lump: Secondary | ICD-10-CM | POA: Diagnosis not present

## 2022-03-12 NOTE — Telephone Encounter (Signed)
Left message for Dr Court Joy per Dr Paulla Dolly pt just needs to stay off foot as much as possible until his appt.

## 2022-03-12 NOTE — Telephone Encounter (Signed)
Pts pcp doctor called (Dr Court Joy from [facility]) called asking about this pt that has appt on 11.20. They did a mri and pt has a tear in his right ATSL and he wanted your advise as to what pt needed to do until his appt next Monday. The Mri results are in epic for pts chart if you could look at them and call the doctor back with recommendations. His cell number is [phone]

## 2022-03-15 ENCOUNTER — Encounter: Payer: Self-pay | Admitting: Podiatry

## 2022-03-15 ENCOUNTER — Ambulatory Visit (INDEPENDENT_AMBULATORY_CARE_PROVIDER_SITE_OTHER): Payer: BC Managed Care – PPO

## 2022-03-15 ENCOUNTER — Ambulatory Visit (INDEPENDENT_AMBULATORY_CARE_PROVIDER_SITE_OTHER): Payer: BC Managed Care – PPO | Admitting: Podiatry

## 2022-03-15 DIAGNOSIS — M7671 Peroneal tendinitis, right leg: Secondary | ICD-10-CM | POA: Diagnosis not present

## 2022-03-15 DIAGNOSIS — M79671 Pain in right foot: Secondary | ICD-10-CM | POA: Diagnosis not present

## 2022-03-15 DIAGNOSIS — G629 Polyneuropathy, unspecified: Secondary | ICD-10-CM | POA: Diagnosis not present

## 2022-03-15 MED ORDER — TRIAMCINOLONE ACETONIDE 10 MG/ML IJ SUSP
10.0000 mg | Freq: Once | INTRAMUSCULAR | Status: AC
  Administered 2022-03-15: 10 mg

## 2022-03-16 NOTE — Progress Notes (Signed)
Subjective:   Patient ID: Donald [name], male   DOB: 56 y.o.   MRN: [id number]   HPI Patient states he has had a lot of sharp pain in his right foot x2 weeks that started in the arch then moved to the back and now has moved to the outside of the right foot.  He may have a history of gout not a good historian and patient has stopped smoking and tries to be active   Review of Systems  All other systems reviewed and are negative.       Objective:  Physical Exam Vitals and nursing note reviewed.  Constitutional:      Appearance: He is well-developed.  Pulmonary:     Effort: Pulmonary effort is normal.  Musculoskeletal:        General: Normal range of motion.  Skin:    General: Skin is warm.  Neurological:     Mental Status: He is alert.     Neurovascular status intact muscle strength was found to be within normal limits with patient found to have quite a bit of discomfort in the lateral side of his foot around the peroneal tendon with the probability that this is compensation for other problems with the possibility of gout as a precipitating factor to condition     Assessment:  Appears to be current acute peroneal tendinitis right which may be compensatory versus primary along with possibility of gout     Plan:  H&P educated him on condition and get a focus on the acute inflammation and if symptoms persist come back or intensify I want to see him back again.  Today I went ahead I did sterile prep and I injected the lateral side around the peroneal 3 mg dexamethasone Kenalog 5 mg Xylocaine applied fascial brace to lift up the lateral side of the foot along with aggressive ice and reappoint to recheck  X-rays indicate that there is no signs of bony injury or stress fracture or arthritis associated with condition with high arch foot structure

## 2022-03-31 ENCOUNTER — Ambulatory Visit (INDEPENDENT_AMBULATORY_CARE_PROVIDER_SITE_OTHER): Payer: BC Managed Care – PPO | Admitting: Internal Medicine

## 2022-03-31 ENCOUNTER — Encounter: Payer: Self-pay | Admitting: Internal Medicine

## 2022-03-31 VITALS — BP 158/94 | HR 95 | Ht 74.0 in | Wt 201.8 lb

## 2022-03-31 DIAGNOSIS — G8929 Other chronic pain: Secondary | ICD-10-CM

## 2022-03-31 DIAGNOSIS — Z23 Encounter for immunization: Secondary | ICD-10-CM | POA: Diagnosis not present

## 2022-03-31 DIAGNOSIS — M7731 Calcaneal spur, right foot: Secondary | ICD-10-CM

## 2022-03-31 DIAGNOSIS — I480 Paroxysmal atrial fibrillation: Secondary | ICD-10-CM

## 2022-03-31 DIAGNOSIS — M25571 Pain in right ankle and joints of right foot: Secondary | ICD-10-CM | POA: Diagnosis not present

## 2022-03-31 DIAGNOSIS — I10 Essential (primary) hypertension: Secondary | ICD-10-CM | POA: Diagnosis not present

## 2022-03-31 DIAGNOSIS — Z953 Presence of xenogenic heart valve: Secondary | ICD-10-CM

## 2022-03-31 DIAGNOSIS — M25572 Pain in left ankle and joints of left foot: Secondary | ICD-10-CM

## 2022-03-31 MED ORDER — LOSARTAN POTASSIUM 25 MG PO TABS: 25.0000 mg | ORAL_TABLET | Freq: Every day | ORAL | 2 refills | Status: DC

## 2022-03-31 MED ORDER — PREDNISONE 20 MG PO TABS: 40.0000 mg | ORAL_TABLET | Freq: Every day | ORAL | 0 refills | Status: DC

## 2022-03-31 NOTE — Patient Instructions (Signed)
Please start taking Losartan as prescribed for high BP.  Please continue taking other medications as prescribed.  Please follow DASH diet and perform moderate exercise/walking as tolerated.

## 2022-04-01 ENCOUNTER — Ambulatory Visit (INDEPENDENT_AMBULATORY_CARE_PROVIDER_SITE_OTHER): Payer: BC Managed Care – PPO | Admitting: Podiatry

## 2022-04-01 ENCOUNTER — Encounter: Payer: Self-pay | Admitting: Podiatry

## 2022-04-01 ENCOUNTER — Ambulatory Visit (INDEPENDENT_AMBULATORY_CARE_PROVIDER_SITE_OTHER): Payer: BC Managed Care – PPO

## 2022-04-01 DIAGNOSIS — M779 Enthesopathy, unspecified: Secondary | ICD-10-CM

## 2022-04-01 DIAGNOSIS — M7672 Peroneal tendinitis, left leg: Secondary | ICD-10-CM

## 2022-04-01 DIAGNOSIS — M7752 Other enthesopathy of left foot: Secondary | ICD-10-CM

## 2022-04-01 MED ORDER — TRIAMCINOLONE ACETONIDE 10 MG/ML IJ SUSP
10.0000 mg | Freq: Once | INTRAMUSCULAR | Status: AC
  Administered 2022-04-01: 10 mg

## 2022-04-01 NOTE — Assessment & Plan Note (Signed)
Rate controlled with metoprolol On Xarelto for AC Followed by Cardiology 

## 2022-04-01 NOTE — Assessment & Plan Note (Signed)
BP Readings from Last 1 Encounters:  03/31/22 (!) 158/94   Uncontrolled with Metoprolol Added Losartan 25 mg QD, needs to start taking it Counseled for compliance with the medications Advised DASH diet and moderate exercise/walking, at least 150 mins/week

## 2022-04-01 NOTE — Progress Notes (Addendum)
Established Patient Office Visit  Subjective:  Patient ID: Donald [name], male    DOB: 08-24-1965  Age: 56 y.o. MRN: [id number]  CC:  Chief Complaint  Patient presents with   Follow-up    Patient states left foot painful     HPI Donald [name] is a 56 y.o. male with past medical history of AR s/p AV replacement, nonischemic CM, HTN, paroxysmal A Fib, gout and HLD who presents for f/u of his chronic medical conditions.  He complains of left foot pain for the last 1 week.  He had right ankle and foot pain in the last month, and had x-ray of the ankle and later MRI of the ankle.  It showed advanced tendinosis, and had podiatry evaluation.  He had steroid injection for it, which has improved his right foot pain. Of note, he has history of gout, and was started on allopurinol for it.  He had previous gout flareups in his ankle.  His last uric acid level was 4.3.  He does report occasional alcohol intake, but denies any binge drinking.  He had to wear work boots for 10-11 hours in a day.  HTN and A Fib: His BP was elevated today.  He attributes it to pain.  He takes metoprolol for history of NICM and paroxysmal A-fib.  He was placed on losartan for elevated BP on 11/08, but he has not started taking it yet. Patient denies headache, dizziness, chest pain, dyspnea or palpitations.  He is on metoprolol and Xarelto currently.    Past Medical History:  Diagnosis Date   Acute gout of left foot 10/03/2019   Alcohol use 06/27/2019   Bone tumor 11/22/2013   Cardiomyopathy ([place])    a. EF 30 to 35% by echo in 06/2019 in the setting of severe AI   COVID-19 04/16/2020   Encounter for screening for malignant neoplasm of colon 06/27/2019   Glenoid labral tear 10/27/2010   History of gout 06/27/2019   Hyperlipidemia    Mass of soft tissue of foot    Patent foramen ovale    Rotator cuff tear, right 10/27/2010   S/P aortic valve replacement with bioprosthetic valve 08/09/2019   29 mm Edwards Inspiris  Resilia stented bovine pericardial tissue valve   S/P patent foramen ovale closure 08/09/2019   Severe aortic insufficiency     Past Surgical History:  Procedure Laterality Date   AORTIC VALVE REPLACEMENT N/A 08/09/2019   Procedure: AORTIC VALVE REPLACEMENT (AVR) using Margaretha Sheffield Resilia 29 MM Aortic Valve.;  Surgeon: Rexene Alberts, MD;  Location: [facility];  Service: Open Heart Surgery;  Laterality: N/A;   BACK SURGERY     multiple back surgery   Benign tumor     Left shin   BUBBLE STUDY  07/05/2019   Procedure: BUBBLE STUDY;  Surgeon: Satira Sark, MD;  Location: [facility];  Service: Cardiovascular;;   CARDIAC VALVE REPLACEMENT N/A    Phreesia 02/19/2020   HERNIA REPAIR     REPAIR OF PATENT FORAMEN OVALE N/A 08/09/2019   Procedure: Closure Of Patent Foramen Ovale;  Surgeon: Rexene Alberts, MD;  Location: [facility];  Service: Open Heart Surgery;  Laterality: N/A;   RIGHT/LEFT HEART CATH AND CORONARY ANGIOGRAPHY N/A 07/26/2019   Procedure: RIGHT/LEFT HEART CATH AND CORONARY ANGIOGRAPHY;  Surgeon: Burnell Blanks, MD;  Location: [facility];  Service: Cardiovascular;  Laterality: N/A;   SHOULDER SURGERY Left    SHOULDER SURGERY Right  TEE WITHOUT CARDIOVERSION N/A 07/05/2019   Procedure: TRANSESOPHAGEAL ECHOCARDIOGRAM (TEE) WITH PROPOFOL;  Surgeon: Satira Sark, MD;  Location: [facility];  Service: Cardiovascular;  Laterality: N/A;   TEE WITHOUT CARDIOVERSION N/A 08/09/2019   Procedure: TRANSESOPHAGEAL ECHOCARDIOGRAM (TEE);  Surgeon: Rexene Alberts, MD;  Location: [facility];  Service: Open Heart Surgery;  Laterality: N/A;   Thumb surgery Left x3   VASECTOMY N/A    Phreesia 02/19/2020    Family History  Problem Relation Age of Onset   Other Father        Kidney    Social History   Socioeconomic History   Marital status: Married    Spouse name: [name]    Number of children: 4   Years of education: Not on file   Highest education level: Some college, no degree   Occupational History   Occupation: Nurse, adult: [employer]  Tobacco Use   Smoking status: Every Day    Packs/day: 0.50    Types: Cigarettes    Last attempt to quit: 08/02/2019    Years since quitting: 2.6   Smokeless tobacco: Never   Tobacco comments:    Cutting down < 5 cig / day  Vaping Use   Vaping Use: Never used  Substance and Sexual Activity   Alcohol use: Not Currently    Comment: "a little wine"   Drug use: No   Sexual activity: Not on file  Other Topics Concern   Not on file  Social History Narrative   Live [name] wife of 2 years, previously was widowed from first wife from cancer 01/2011      Four children      Enjoy: golf, target shooting       Diet:    Caffeine: 16-20 oz coffee    Water: 6-7 bottles       Wears seat belt    Smoke detectors at home    Does not use phone while driving    Social Determinants of Health   Financial Resource Strain: Low Risk  (02/20/2020)   Overall Financial Resource Strain (CARDIA)    Difficulty of Paying Living Expenses: Not hard at all  Food Insecurity: No Food Insecurity (02/20/2020)   Hunger Vital Sign    Worried About Running Out of Food in the Last Year: Never true    [place] in the Last Year: Never true  Transportation Needs: No Transportation Needs (02/20/2020)   PRAPARE - Hydrologist (Medical): No    Lack of Transportation (Non-Medical): No  Physical Activity: Sufficiently Active (02/20/2020)   Exercise Vital Sign    Days of Exercise per Week: 5 days    Minutes of Exercise per Session: 50 min  Stress: No Stress Concern Present (02/20/2020)   [place]    Feeling of Stress : Not at all  Social Connections: Moderately Isolated (02/20/2020)   Social Connection and Isolation Panel [NHANES]    Frequency of Communication with Friends and Family: More than three times a week    Frequency  of Social Gatherings with Friends and Family: Once a week    Attends Religious Services: Never    Marine scientist or Organizations: No    Attends Archivist Meetings: Never    Marital Status: Married  Human resources officer Violence: Not At Risk (02/20/2020)   Humiliation, Afraid, Rape, and Kick questionnaire    Fear of  Current or Ex-Partner: No    Emotionally Abused: No    Physically Abused: No    Sexually Abused: No    Outpatient Medications Prior to Visit  Medication Sig Dispense Refill   allopurinol (ZYLOPRIM) 100 MG tablet TAKE 1 TABLET BY MOUTH EVERY DAY 90 tablet 2   atorvastatin (LIPITOR) 10 MG tablet TAKE 1 TABLET BY MOUTH EVERY DAY 90 tablet 1   Cholecalciferol (VITAMIN D3) 50 MCG (2000 UT) TABS Take 2,000 Units by mouth daily.     Coenzyme Q10 (COQ10) 100 MG CAPS Take 100 mg by mouth daily.     diclofenac Sodium (VOLTAREN) 1 % GEL Apply 4 g topically 4 (four) times daily. 50 g 0   HYDROcodone-acetaminophen (NORCO) 5-325 MG tablet Take 1 tablet by mouth every 6 (six) hours as needed for moderate pain. 15 tablet 0   ketoconazole (NIZORAL) 2 % cream Apply 1 application topically daily. 15 g 0   metoprolol succinate (TOPROL-XL) 50 MG 24 hr tablet TAKE 1 TABLET BY MOUTH DAILY. TAKE WITH OR IMMEDIATELY FOLLOWING A MEAL. 90 tablet 3   Multiple Vitamin (MULTIVITAMIN WITH MINERALS) TABS tablet Take 1 tablet by mouth daily.     Omega-3 Fatty Acids (FISH OIL PO) Take 2,000 mg by mouth daily.      omeprazole (PRILOSEC) 20 MG capsule Take 1 capsule (20 mg total) by mouth daily. 7 capsule 0   rivaroxaban (XARELTO) 20 MG TABS tablet TAKE 1 TABLET BY MOUTH DAILY WITH SUPPER 30 tablet 5   losartan (COZAAR) 25 MG tablet Take 1 tablet (25 mg total) by mouth daily. 30 tablet 2   No facility-administered medications prior to visit.    Allergies  Allergen Reactions   Ultram [Tramadol Hcl] Itching    "Tunnel vision" warm feeling all over and wanted to jump out of his skin     ROS Review of Systems  Constitutional:  Negative for chills and fever.  HENT:  Negative for congestion, sinus pressure and sinus pain.   Eyes:  Negative for pain and discharge.  Respiratory:  Negative for cough and shortness of breath.   Cardiovascular:  Negative for chest pain and palpitations.  Gastrointestinal:  Negative for diarrhea, nausea and vomiting.  Genitourinary:  Negative for dysuria and hematuria.  Musculoskeletal:  Positive for arthralgias (B/l foot). Negative for neck pain and neck stiffness.  Skin:  Negative for rash.  Neurological:  Negative for weakness and numbness.  Psychiatric/Behavioral:  Negative for agitation and behavioral problems.       Objective:    Physical Exam Vitals reviewed.  Constitutional:      General: He is not in acute distress.    Appearance: He is not diaphoretic.  HENT:     Head: Normocephalic and atraumatic.     Nose: Nose normal.     Mouth/Throat:     Mouth: Mucous membranes are moist.  Eyes:     General: No scleral icterus.    Extraocular Movements: Extraocular movements intact.  Cardiovascular:     Rate and Rhythm: Normal rate and regular rhythm.     Pulses: Normal pulses.     Heart sounds: Normal heart sounds. No murmur heard. Pulmonary:     Breath sounds: Normal breath sounds. No wheezing or rales.  Genitourinary:    Penis: Normal.   Musculoskeletal:     Cervical back: Neck supple. No tenderness.     Right lower leg: No edema.     Left lower leg: No edema.  Right foot: Decreased range of motion.     Comments: Right ankle brace in place  Skin:    General: Skin is warm.     Findings: No rash.  Neurological:     General: No focal deficit present.     Mental Status: He is alert and oriented to person, place, and time.     Sensory: No sensory deficit.     Motor: No weakness.  Psychiatric:        Mood and Affect: Mood normal.        Behavior: Behavior normal.     BP (!) 158/94 (BP Location: Left Arm, Cuff  Size: Normal)   Pulse 95   Ht '6\' 2"'$  (1.88 m)   Wt 201 lb 12.8 oz (91.5 kg)   SpO2 96%   BMI 25.91 kg/m  Wt Readings from Last 3 Encounters:  03/31/22 201 lb 12.8 oz (91.5 kg)  03/03/22 200 lb 6.4 oz (90.9 kg)  01/07/22 199 lb 6.4 oz (90.4 kg)    Lab Results  Component Value Date   TSH 1.540 02/26/2021   Lab Results  Component Value Date   WBC 7.2 02/26/2021   HGB 15.2 02/26/2021   HCT 44.1 02/26/2021   MCV 93 02/26/2021   PLT 177 02/26/2021   Lab Results  Component Value Date   NA 138 02/26/2021   K 4.5 02/26/2021   CO2 23 02/26/2021   GLUCOSE 94 02/26/2021   BUN 20 02/26/2021   CREATININE 0.91 02/26/2021   BILITOT 0.4 02/26/2021   ALKPHOS 83 02/26/2021   AST 31 02/26/2021   ALT 33 02/26/2021   PROT 6.6 02/26/2021   ALBUMIN 4.4 02/26/2021   CALCIUM 9.2 02/26/2021   ANIONGAP 6 05/21/2020   EGFR 101 02/26/2021   Lab Results  Component Value Date   CHOL 189 02/26/2021   Lab Results  Component Value Date   HDL 51 02/26/2021   Lab Results  Component Value Date   LDLCALC 84 02/26/2021   Lab Results  Component Value Date   TRIG 335 (H) 02/26/2021   Lab Results  Component Value Date   CHOLHDL 3.7 02/26/2021   Lab Results  Component Value Date   HGBA1C 5.3 02/26/2021      Assessment & Plan:   Problem List Items Addressed This Visit       Cardiovascular and Mediastinum   Essential hypertension    BP Readings from Last 1 Encounters:  03/31/22 (!) 158/94  Uncontrolled with Metoprolol Added Losartan 25 mg QD, needs to start taking it Counseled for compliance with the medications Advised DASH diet and moderate exercise/walking, at least 150 mins/week      Relevant Medications   losartan (COZAAR) 25 MG tablet   Paroxysmal atrial fibrillation (H[facility])    Rate controlled with metoprolol On Xarelto for AC Followed by Cardiology      Relevant Medications   losartan (COZAAR) 25 MG tablet     Other   S/P aortic valve replacement with  bioprosthetic valve    For severe AS Currently euvolemic, denies any chest pain, dyspnea or palpitations      Chronic pain of both ankles - Primary    Likely due to hard based work shoes Has been having alternating side foot pain, likely due to pressure on the other foot due to pain on one side Prednisone 40 mg QD for 5 days Rest, ice and brace as tolerated He has orthotic support for his shoes Has seen podiatrist - right  foot pain has improved with steroid injection      Relevant Medications   predniSONE (DELTASONE) 20 MG tablet   Other Visit Diagnoses     Need for tetanus booster       Relevant Orders   Tdap vaccine greater than or equal to 7yo IM (Completed)       Meds ordered this encounter  Medications   predniSONE (DELTASONE) 20 MG tablet    Sig: Take 2 tablets (40 mg total) by mouth daily with breakfast.    Dispense:  10 tablet    Refill:  0   losartan (COZAAR) 25 MG tablet    Sig: Take 1 tablet (25 mg total) by mouth daily.    Dispense:  30 tablet    Refill:  2    Follow-up: Return in about 3 months (around 06/30/2022) for HTN and A Fib.Lindell Spar, MD

## 2022-04-01 NOTE — Assessment & Plan Note (Signed)
Likely due to hard based work shoes Has been having alternating side foot pain, likely due to pressure on the other foot due to pain on one side Prednisone 40 mg QD for 5 days Rest, ice and brace as tolerated He has orthotic support for his shoes Has seen podiatrist - right foot pain has improved with steroid injection

## 2022-04-01 NOTE — Assessment & Plan Note (Signed)
For severe AS Currently euvolemic, denies any chest pain, dyspnea or palpitations

## 2022-04-02 NOTE — Progress Notes (Signed)
Subjective:   Patient ID: Donald [name], male   DOB: 56 y.o.   MRN: [id number]   HPI Patient states the right foot is doing better but he is now getting it more in the left foot with the pain with patient having significant foot structural issues with cavus   ROS      Objective:  Physical Exam  Neurovascular status intact inflammation pain now centered more in the lateral aspect of the left foot at the peroneal insertion with the right doing better after previous treatment     Assessment:  Peroneal tendinitis left with improvement right with a lot of this due to foot position     Plan:  H&P reviewed and today I went ahead I did sterile prep and I injected the peroneal insertion left 3 mg Kenalog 5 mg Xylocaine after advising on risk of injection.  Applied sterile dressing to reduce activity reappoint as needed  X-rays dated today did not indicate any kind of a fracture or arthritis associated with the acute left foot pain

## 2022-04-12 ENCOUNTER — Telehealth: Payer: Self-pay | Admitting: Internal Medicine

## 2022-04-12 NOTE — Telephone Encounter (Signed)
Error

## 2022-04-13 ENCOUNTER — Ambulatory Visit: Payer: Self-pay

## 2022-04-13 DIAGNOSIS — M5416 Radiculopathy, lumbar region: Secondary | ICD-10-CM | POA: Diagnosis not present

## 2022-04-27 ENCOUNTER — Encounter: Payer: Self-pay | Admitting: Internal Medicine

## 2022-04-27 ENCOUNTER — Ambulatory Visit: Payer: BC Managed Care – PPO | Admitting: Internal Medicine

## 2022-06-13 ENCOUNTER — Other Ambulatory Visit: Payer: Self-pay | Admitting: Internal Medicine

## 2022-06-13 ENCOUNTER — Other Ambulatory Visit: Payer: Self-pay | Admitting: Cardiology

## 2022-06-13 DIAGNOSIS — M109 Gout, unspecified: Secondary | ICD-10-CM

## 2022-06-24 ENCOUNTER — Encounter: Payer: Self-pay | Admitting: Radiology

## 2022-07-01 ENCOUNTER — Ambulatory Visit (INDEPENDENT_AMBULATORY_CARE_PROVIDER_SITE_OTHER): Payer: BC Managed Care – PPO | Admitting: Internal Medicine

## 2022-07-01 ENCOUNTER — Encounter: Payer: Self-pay | Admitting: Internal Medicine

## 2022-07-01 VITALS — BP 136/90 | HR 88 | Ht 74.0 in | Wt 200.4 lb

## 2022-07-01 DIAGNOSIS — M25571 Pain in right ankle and joints of right foot: Secondary | ICD-10-CM

## 2022-07-01 DIAGNOSIS — I428 Other cardiomyopathies: Secondary | ICD-10-CM | POA: Diagnosis not present

## 2022-07-01 DIAGNOSIS — M25572 Pain in left ankle and joints of left foot: Secondary | ICD-10-CM

## 2022-07-01 DIAGNOSIS — E782 Mixed hyperlipidemia: Secondary | ICD-10-CM

## 2022-07-01 DIAGNOSIS — M1A0790 Idiopathic chronic gout, unspecified ankle and foot, without tophus (tophi): Secondary | ICD-10-CM

## 2022-07-01 DIAGNOSIS — G8929 Other chronic pain: Secondary | ICD-10-CM

## 2022-07-01 DIAGNOSIS — I10 Essential (primary) hypertension: Secondary | ICD-10-CM

## 2022-07-01 DIAGNOSIS — I480 Paroxysmal atrial fibrillation: Secondary | ICD-10-CM

## 2022-07-01 MED ORDER — LOSARTAN POTASSIUM 50 MG PO TABS: 50.0000 mg | ORAL_TABLET | Freq: Every day | ORAL | 1 refills | Status: DC

## 2022-07-01 NOTE — Progress Notes (Signed)
Established Patient Office Visit  Subjective:  Patient ID: Donald [name], male    DOB: [birth year]  Age: 57 y.o. MRN: [id number]  CC:  Chief Complaint  Patient presents with   Hypertension    Three month follow up for hypertension and AFIB. Patient states he woke up the other day and felt like he had a broken rib, it is still sore to the touch     HPI Donald [name] is a 57 y.o. male with past medical history of AR s/p AV replacement, nonischemic CM, HTN, paroxysmal A Fib, gout and HLD who presents for f/u of his chronic medical conditions.  He has chronic right foot pain from a bone spur.  He had right ankle and foot pain in 11/23, and had x-ray of the ankle and later MRI of the ankle.  It showed advanced tendinosis, and had podiatry evaluation.  He had steroid injection for it, which had improved his right foot pain. Of note, he has history of gout, and was started on allopurinol for it.  He had previous gout flareups in his ankle.  His last uric acid level was 4.3.  He does report occasional alcohol intake, but denies any binge drinking.  He had to wear work boots for 10-11 hours in a day.  HTN and A Fib: His BP was elevated today.  He attributes it to pain.  He takes metoprolol for history of NICM and paroxysmal A-fib.  He was placed on losartan for elevated BP on 11/08.  Patient denies headache, dizziness, chest pain, dyspnea or palpitations.  He is on metoprolol and Xarelto currently.    Past Medical History:  Diagnosis Date   Acute gout of left foot 10/03/2019   Alcohol use 06/27/2019   Bone tumor 11/22/2013   Cardiomyopathy ([place])    a. EF 30 to 35% by echo in 06/2019 in the setting of severe AI   COVID-19 04/16/2020   Encounter for screening for malignant neoplasm of colon 06/27/2019   Glenoid labral tear 10/27/2010   History of gout 06/27/2019   Hyperlipidemia    Mass of soft tissue of foot    Patent foramen ovale    Rotator cuff tear, right 10/27/2010   S/P aortic valve  replacement with bioprosthetic valve 08/09/2019   29 mm Edwards Inspiris Resilia stented bovine pericardial tissue valve   S/P patent foramen ovale closure 08/09/2019   Severe aortic insufficiency     Past Surgical History:  Procedure Laterality Date   AORTIC VALVE REPLACEMENT N/A 08/09/2019   Procedure: AORTIC VALVE REPLACEMENT (AVR) using Margaretha Sheffield Resilia 29 MM Aortic Valve.;  Surgeon: Rexene Alberts, MD;  Location: [facility];  Service: Open Heart Surgery;  Laterality: N/A;   BACK SURGERY     multiple back surgery   Benign tumor     Left shin   BUBBLE STUDY  07/05/2019   Procedure: BUBBLE STUDY;  Surgeon: Satira Sark, MD;  Location: [facility];  Service: Cardiovascular;;   CARDIAC VALVE REPLACEMENT N/A    Phreesia 02/19/2020   HERNIA REPAIR     REPAIR OF PATENT FORAMEN OVALE N/A 08/09/2019   Procedure: Closure Of Patent Foramen Ovale;  Surgeon: Rexene Alberts, MD;  Location: [facility];  Service: Open Heart Surgery;  Laterality: N/A;   RIGHT/LEFT HEART CATH AND CORONARY ANGIOGRAPHY N/A 07/26/2019   Procedure: RIGHT/LEFT HEART CATH AND CORONARY ANGIOGRAPHY;  Surgeon: Burnell Blanks, MD;  Location: [facility];  Service: Cardiovascular;  Laterality: N/A;   SHOULDER SURGERY Left    SHOULDER SURGERY Right    TEE WITHOUT CARDIOVERSION N/A 07/05/2019   Procedure: TRANSESOPHAGEAL ECHOCARDIOGRAM (TEE) WITH PROPOFOL;  Surgeon: Satira Sark, MD;  Location: [facility];  Service: Cardiovascular;  Laterality: N/A;   TEE WITHOUT CARDIOVERSION N/A 08/09/2019   Procedure: TRANSESOPHAGEAL ECHOCARDIOGRAM (TEE);  Surgeon: Rexene Alberts, MD;  Location: [facility];  Service: Open Heart Surgery;  Laterality: N/A;   Thumb surgery Left x3   VASECTOMY N/A    Phreesia 02/19/2020    Family History  Problem Relation Age of Onset   Other Father        Kidney    Social History   Socioeconomic History   Marital status: Married    Spouse name: [name]    Number of children: 4   Years of  education: Not on file   Highest education level: Some college, no degree  Occupational History   Occupation: Nurse, adult: [employer]  Tobacco Use   Smoking status: Every Day    Packs/day: 0.50    Types: Cigarettes    Last attempt to quit: 08/02/2019    Years since quitting: 2.9   Smokeless tobacco: Never   Tobacco comments:    Cutting down < 5 cig / day  Vaping Use   Vaping Use: Never used  Substance and Sexual Activity   Alcohol use: Not Currently    Comment: "a little wine"   Drug use: No   Sexual activity: Not on file  Other Topics Concern   Not on file  Social History Narrative   Live [name] wife of 2 years, previously was widowed from first wife from cancer 01/2011      Four children      Enjoy: golf, target shooting       Diet:    Caffeine: 16-20 oz coffee    Water: 6-7 bottles       Wears seat belt    Smoke detectors at home    Does not use phone while driving    Social Determinants of Health   Financial Resource Strain: Low Risk  (02/20/2020)   Overall Financial Resource Strain (CARDIA)    Difficulty of Paying Living Expenses: Not hard at all  Food Insecurity: No Food Insecurity (02/20/2020)   Hunger Vital Sign    Worried About Running Out of Food in the Last Year: Never true    [place] in the Last Year: Never true  Transportation Needs: No Transportation Needs (02/20/2020)   PRAPARE - Hydrologist (Medical): No    Lack of Transportation (Non-Medical): No  Physical Activity: Sufficiently Active (02/20/2020)   Exercise Vital Sign    Days of Exercise per Week: 5 days    Minutes of Exercise per Session: 50 min  Stress: No Stress Concern Present (02/20/2020)   [place]    Feeling of Stress : Not at all  Social Connections: Moderately Isolated (02/20/2020)   Social Connection and Isolation Panel [NHANES]    Frequency of  Communication with Friends and Family: More than three times a week    Frequency of Social Gatherings with Friends and Family: Once a week    Attends Religious Services: Never    Marine scientist or Organizations: No    Attends Archivist Meetings: Never    Marital Status: Married  Human resources officer Violence: Not  At Risk (02/20/2020)   Humiliation, Afraid, Rape, and Kick questionnaire    Fear of Current or Ex-Partner: No    Emotionally Abused: No    Physically Abused: No    Sexually Abused: No    Outpatient Medications Prior to Visit  Medication Sig Dispense Refill   allopurinol (ZYLOPRIM) 100 MG tablet TAKE 1 TABLET BY MOUTH EVERY DAY 90 tablet 2   atorvastatin (LIPITOR) 10 MG tablet TAKE 1 TABLET BY MOUTH EVERY DAY 90 tablet 1   Cholecalciferol (VITAMIN D3) 50 MCG (2000 UT) TABS Take 2,000 Units by mouth daily.     Coenzyme Q10 (COQ10) 100 MG CAPS Take 100 mg by mouth daily.     metoprolol succinate (TOPROL-XL) 50 MG 24 hr tablet TAKE 1 TABLET BY MOUTH EVERY DAY WITH OR IMMEDIATELY FOLLOWING A MEAL 90 tablet 3   Multiple Vitamin (MULTIVITAMIN WITH MINERALS) TABS tablet Take 1 tablet by mouth daily.     Omega-3 Fatty Acids (FISH OIL PO) Take 2,000 mg by mouth daily.      rivaroxaban (XARELTO) 20 MG TABS tablet TAKE 1 TABLET BY MOUTH DAILY WITH SUPPER 30 tablet 5   losartan (COZAAR) 25 MG tablet Take 1 tablet (25 mg total) by mouth daily. 30 tablet 2   omeprazole (PRILOSEC) 20 MG capsule Take 1 capsule (20 mg total) by mouth daily. 7 capsule 0   predniSONE (DELTASONE) 20 MG tablet Take 2 tablets (40 mg total) by mouth daily with breakfast. 10 tablet 0   No facility-administered medications prior to visit.    Allergies  Allergen Reactions   Ultram [Tramadol Hcl] Itching    "Tunnel vision" warm feeling all over and wanted to jump out of his skin    ROS Review of Systems  Constitutional:  Negative for chills and fever.  HENT:  Negative for congestion, sinus  pressure and sinus pain.   Eyes:  Negative for pain and discharge.  Respiratory:  Negative for cough and shortness of breath.   Cardiovascular:  Negative for chest pain and palpitations.  Gastrointestinal:  Negative for diarrhea, nausea and vomiting.  Genitourinary:  Negative for dysuria and hematuria.  Musculoskeletal:  Positive for arthralgias (B/l foot). Negative for neck pain and neck stiffness.  Skin:  Negative for rash.  Neurological:  Negative for weakness and numbness.  Psychiatric/Behavioral:  Negative for agitation and behavioral problems.       Objective:    Physical Exam Vitals reviewed.  Constitutional:      General: He is not in acute distress.    Appearance: He is not diaphoretic.  HENT:     Head: Normocephalic and atraumatic.     Nose: Nose normal.     Mouth/Throat:     Mouth: Mucous membranes are moist.  Eyes:     General: No scleral icterus.    Extraocular Movements: Extraocular movements intact.  Cardiovascular:     Rate and Rhythm: Normal rate and regular rhythm.     Pulses: Normal pulses.     Heart sounds: Normal heart sounds. No murmur heard. Pulmonary:     Breath sounds: Normal breath sounds. No wheezing or rales.  Genitourinary:    Penis: Normal.   Musculoskeletal:     Cervical back: Neck supple. No tenderness.     Right lower leg: No edema.     Left lower leg: No edema.     Right foot: Decreased range of motion.  Skin:    General: Skin is warm.     Findings:  No rash.  Neurological:     General: No focal deficit present.     Mental Status: He is alert and oriented to person, place, and time.     Sensory: No sensory deficit.     Motor: No weakness.  Psychiatric:        Mood and Affect: Mood normal.        Behavior: Behavior normal.     BP (!) 136/90 (BP Location: Left Arm)   Pulse 88   Ht '6\' 2"'$  (1.88 m)   Wt 200 lb 6.4 oz (90.9 kg)   SpO2 96%   BMI 25.73 kg/m  Wt Readings from Last 3 Encounters:  07/01/22 200 lb 6.4 oz (90.9 kg)   03/31/22 201 lb 12.8 oz (91.5 kg)  03/03/22 200 lb 6.4 oz (90.9 kg)    Lab Results  Component Value Date   TSH 1.540 02/26/2021   Lab Results  Component Value Date   WBC 7.2 02/26/2021   HGB 15.2 02/26/2021   HCT 44.1 02/26/2021   MCV 93 02/26/2021   PLT 177 02/26/2021   Lab Results  Component Value Date   NA 138 02/26/2021   K 4.5 02/26/2021   CO2 23 02/26/2021   GLUCOSE 94 02/26/2021   BUN 20 02/26/2021   CREATININE 0.91 02/26/2021   BILITOT 0.4 02/26/2021   ALKPHOS 83 02/26/2021   AST 31 02/26/2021   ALT 33 02/26/2021   PROT 6.6 02/26/2021   ALBUMIN 4.4 02/26/2021   CALCIUM 9.2 02/26/2021   ANIONGAP 6 05/21/2020   EGFR 101 02/26/2021   Lab Results  Component Value Date   CHOL 189 02/26/2021   Lab Results  Component Value Date   HDL 51 02/26/2021   Lab Results  Component Value Date   LDLCALC 84 02/26/2021   Lab Results  Component Value Date   TRIG 335 (H) 02/26/2021   Lab Results  Component Value Date   CHOLHDL 3.7 02/26/2021   Lab Results  Component Value Date   HGBA1C 5.3 02/26/2021      Assessment & Plan:   Problem List Items Addressed This Visit       Cardiovascular and Mediastinum   Essential hypertension - Primary    BP Readings from Last 1 Encounters:  07/01/22 (!) 136/90  Uncontrolled with Metoprolol and Losartan 25 mg QD Home BP around 130s-150/80s-98 Increased dose of Losartan to 50 mg QD Counseled for compliance with the medications Advised DASH diet and moderate exercise/walking, at least 150 mins/week      Relevant Medications   losartan (COZAAR) 50 MG tablet   Paroxysmal atrial fibrillation ([facility])    Rate controlled with metoprolol On Xarelto for AC Followed by Cardiology      Relevant Medications   losartan (COZAAR) 50 MG tablet   Nonischemic cardiomyopathy ([facility])    EF improved to 50% on last echo in 07/22 On metoprolol Denies any dyspnea, orthopnea or PND Followed by Cardiology      Relevant Medications    losartan (COZAAR) 50 MG tablet     Musculoskeletal and Integument   Chronic gout of foot    Takes allopurinol 100 mg daily Check uric acid level      Relevant Orders   Uric acid     Other   Hyperlipidemia    On statin      Relevant Medications   losartan (COZAAR) 50 MG tablet   Chronic pain of both ankles    Likely due to bone spur Has  been having alternating side foot pain, likely due to pressure on the other foot due to pain on one side Unable to take oral NSAIDs as he is on Xarelto Rest, ice and brace as tolerated He has orthotic support for his shoes Has seen podiatrist - right foot pain had improved with steroid injection, but recurred       Meds ordered this encounter  Medications   losartan (COZAAR) 50 MG tablet    Sig: Take 1 tablet (50 mg total) by mouth daily.    Dispense:  90 tablet    Refill:  1    Follow-up: Return in about 4 months (around 10/31/2022) for HTN.    Lindell Spar, MD

## 2022-07-01 NOTE — Assessment & Plan Note (Signed)
On statin.

## 2022-07-01 NOTE — Assessment & Plan Note (Signed)
Takes allopurinol 100 mg daily Check uric acid level

## 2022-07-01 NOTE — Patient Instructions (Signed)
Please start taking Losartan 50 mg once daily.  Please continue taking other medications as prescribed.  Please continue to follow low carb diet and perform moderate exercise/walking at least 150 mins/week.

## 2022-07-01 NOTE — Assessment & Plan Note (Signed)
Likely due to bone spur Has been having alternating side foot pain, likely due to pressure on the other foot due to pain on one side Unable to take oral NSAIDs as he is on Xarelto Rest, ice and brace as tolerated He has orthotic support for his shoes Has seen podiatrist - right foot pain had improved with steroid injection, but recurred

## 2022-07-01 NOTE — Assessment & Plan Note (Signed)
BP Readings from Last 1 Encounters:  07/01/22 (!) 136/90   Uncontrolled with Metoprolol and Losartan 25 mg QD Home BP around 130s-150/80s-98 Increased dose of Losartan to 50 mg QD Counseled for compliance with the medications Advised DASH diet and moderate exercise/walking, at least 150 mins/week

## 2022-07-01 NOTE — Assessment & Plan Note (Signed)
EF improved to 50% on last echo in 07/22 On metoprolol Denies any dyspnea, orthopnea or PND Followed by Cardiology

## 2022-07-01 NOTE — Assessment & Plan Note (Signed)
Rate controlled with metoprolol On Xarelto for [facility] Followed by Cardiology

## 2022-07-05 DIAGNOSIS — M1A0790 Idiopathic chronic gout, unspecified ankle and foot, without tophus (tophi): Secondary | ICD-10-CM | POA: Diagnosis not present

## 2022-07-06 LAB — URIC ACID: Uric Acid: 4.9 mg/dL (ref 3.8–8.4)

## 2022-08-06 ENCOUNTER — Ambulatory Visit (HOSPITAL_COMMUNITY)
Admission: RE | Admit: 2022-08-06 | Discharge: 2022-08-06 | Disposition: A | Discharge status: Home / Self Care | Payer: BC Managed Care – PPO | Source: Ambulatory Visit | Attending: Student | Admitting: Student

## 2022-08-06 DIAGNOSIS — Z953 Presence of xenogenic heart valve: Secondary | ICD-10-CM | POA: Insufficient documentation

## 2022-08-06 LAB — ECHOCARDIOGRAM COMPLETE
AV Mean grad: 8.5 mmHg
AV Peak grad: 17 mmHg
Ao pk vel: 2.06 m/s
Area-P 1/2: 2.73 cm2
S' Lateral: 4.3 cm

## 2022-08-06 NOTE — Progress Notes (Signed)
*  PRELIMINARY RESULTS* Echocardiogram 2D Echocardiogram has been performed.  Stacey Drain 08/06/2022, 1:49 PM

## 2022-08-31 ENCOUNTER — Other Ambulatory Visit: Payer: Self-pay | Admitting: Student

## 2022-08-31 NOTE — Telephone Encounter (Signed)
Prescription refill request for Xarelto received.  Indication: PAF Last office visit: 08/05/21  B Strader PA-C Weight: 89.8kg Age: 57 Scr: 0.91 on 02/26/21 CrCl: 116.50  Based on above findings Xarelto 20mg  daily is the appropriate dose.  Pt is past due for MD appt and lab work.  Message sent to schedulers to make appt ASAP.  Refill approved x 1.  Message sent to pharmacy as well.

## 2022-09-12 ENCOUNTER — Other Ambulatory Visit: Payer: Self-pay | Admitting: Internal Medicine

## 2022-10-20 ENCOUNTER — Other Ambulatory Visit: Payer: Self-pay | Admitting: Internal Medicine

## 2022-10-20 DIAGNOSIS — I10 Essential (primary) hypertension: Secondary | ICD-10-CM

## 2022-10-25 NOTE — Progress Notes (Unsigned)
Cardiology Office Note  Date: 10/26/2022   ID: Donald [name], DOB Mar 10, 1966, MRN [id number]  History of Present Illness: Donald [name] is a 57 y.o. male last assessed by Ms. Strader PA-C and April 2023, I reviewed the note.  He is here for a follow-up visit.  Reports no angina and stable NYHA class I dyspnea.  No palpitations or syncope.  Working full-time at the [employer] facility in [place].  I reviewed his medications.  He reports no spontaneous bleeding problems on Xarelto.  Also tolerating Lipitor and Toprol-XL.  Losartan was most recent addition by PCP.  He tracks blood pressure at home and generally reports systolics in the 120s to 130s.  I reviewed his ECG today which shows sinus rhythm with PAC, right bundle branch block, and increased voltage.  Cardiogram earlier this year as noted below.  Physical Exam: VS:  BP (!) 148/88   Pulse 89   Ht 6\' 2"  (1.88 m)   Wt 194 lb 3.2 oz (88.1 kg)   SpO2 98%   BMI 24.93 kg/m , BMI Body mass index is 24.93 kg/m.  Wt Readings from Last 3 Encounters:  10/26/22 194 lb 3.2 oz (88.1 kg)  07/01/22 200 lb 6.4 oz (90.9 kg)  03/31/22 201 lb 12.8 oz (91.5 kg)    General: Patient appears comfortable at rest. HEENT: Conjunctiva and lids normal. Neck: Supple, no elevated JVP or carotid bruits. Lungs: Clear to auscultation, nonlabored breathing at rest. Cardiac: Regular rate and rhythm, no S3, 2/6 systolic murmur, no diastolic murmur. Abdomen: Soft, bowel sounds present. Extremities: No pitting edema.  ECG:  An ECG dated 10/05/2021 was personally reviewed today and demonstrated:  Sinus rhythm with PAC, IVCD, nonspecific ST abnormalities.  Labwork:  No recent lab work for review today.  Other Studies Reviewed Today:  Echocardiogram 08/06/2022: 1. Anteroseptal wall is hypokinetic. Left ventricular ejection fraction, by estimation, is 55 to 60%. The left ventricle has normal function. The left ventricle demonstrates  regional wall  motion abnormalities (see scoring diagram/findings for  description). There is moderate left  ventricular hypertrophy. Left ventricular diastolic parameters are  consistent with Grade I diastolic dysfunction (impaired relaxation).   2. Right ventricular systolic function is normal. The right ventricular  size is normal.   3. Left atrial size was moderately dilated.   4. The mitral valve is normal in structure. No evidence of mitral valve  regurgitation. No evidence of mitral stenosis.   5. 29 mm Edwards Inspiris Resilia stented bovine pericardial tissue valve  is in the AV position.      . The aortic valve has been repaired/replaced. Aortic valve  regurgitation is not visualized. No aortic stenosis is present.   6. The inferior vena cava is normal in size with <50% respiratory  variability, suggesting right atrial pressure of 8 mmHg.   Assessment and Plan:  1.  History of severe aortic regurgitation status post AVR in April 2021, 29 mm Edwards Inspiris Resilia prosthesis.  Echocardiogram in April of this year revealed no significant aortic regurgitation and a mean AV gradient of approximately 9 mmHg.  He is not on aspirin given concurrent use of Xarelto.  2.  History of nonischemic cardiomyopathy with improvement in LVEF, 55 to 60% range by echocardiogram in April of this year.  3.  Paroxysmal atrial fibrillation.  CHA2DS2-VASc score is 2-3.  He is in sinus rhythm today and reports no progressive sense of palpitations.  Continue Xarelto and Toprol-XL.  Check CBC and  BMET.  4.  Mixed hyperlipidemia.  Continue Lipitor.  Check LFTs and FLP.  5.  Essential hypertension, now on Cozaar.  Continue to track blood pressure at home.  Disposition:  Follow up  1 year.  Signed, Jonelle Sidle, M.D., F.A.C.C.  [facility]

## 2022-10-26 ENCOUNTER — Encounter: Payer: Self-pay | Admitting: Cardiology

## 2022-10-26 ENCOUNTER — Ambulatory Visit: Payer: BC Managed Care – PPO | Attending: Student | Admitting: Cardiology

## 2022-10-26 VITALS — BP 148/88 | HR 89 | Ht 74.0 in | Wt 194.2 lb

## 2022-10-26 DIAGNOSIS — I480 Paroxysmal atrial fibrillation: Secondary | ICD-10-CM | POA: Diagnosis not present

## 2022-10-26 DIAGNOSIS — Z953 Presence of xenogenic heart valve: Secondary | ICD-10-CM | POA: Diagnosis not present

## 2022-10-26 DIAGNOSIS — Z79899 Other long term (current) drug therapy: Secondary | ICD-10-CM

## 2022-10-26 DIAGNOSIS — I10 Essential (primary) hypertension: Secondary | ICD-10-CM | POA: Diagnosis not present

## 2022-10-26 DIAGNOSIS — E782 Mixed hyperlipidemia: Secondary | ICD-10-CM | POA: Diagnosis not present

## 2022-10-26 NOTE — Patient Instructions (Addendum)
Medication Instructions:  Your physician recommends that you continue on your current medications as directed. Please refer to the Current Medication list given to you today.  Labwork: Your physician recommends that you return for a FASTING lipid profile, CMET & CBC tomorrow 10/27/2022 @[facility] ([address]. [place]). Please do not eat or drink for at least 8 hours when you have this done. You may take your medications that morning with a sip of water.  Testing/Procedures: none  Follow-Up: Your physician recommends that you schedule a follow-up appointment in: 6 months  Any Other Special Instructions Will Be Listed Below (If Applicable).  If you need a refill on your cardiac medications before your next appointment, please call your pharmacy.

## 2022-10-27 DIAGNOSIS — I10 Essential (primary) hypertension: Secondary | ICD-10-CM | POA: Diagnosis not present

## 2022-10-27 DIAGNOSIS — E782 Mixed hyperlipidemia: Secondary | ICD-10-CM | POA: Diagnosis not present

## 2022-10-27 DIAGNOSIS — Z79899 Other long term (current) drug therapy: Secondary | ICD-10-CM | POA: Diagnosis not present

## 2022-10-27 DIAGNOSIS — I480 Paroxysmal atrial fibrillation: Secondary | ICD-10-CM | POA: Diagnosis not present

## 2022-10-27 DIAGNOSIS — Z953 Presence of xenogenic heart valve: Secondary | ICD-10-CM | POA: Diagnosis not present

## 2022-10-28 LAB — COMPREHENSIVE METABOLIC PANEL
ALT: 49 IU/L — ABNORMAL HIGH (ref 0–44)
AST: 28 IU/L (ref 0–40)
Albumin: 4.6 g/dL (ref 3.8–4.9)
Alkaline Phosphatase: 77 IU/L (ref 44–121)
BUN/Creatinine Ratio: 21 — ABNORMAL HIGH (ref 9–20)
BUN: 15 mg/dL (ref 6–24)
Bilirubin Total: 0.4 mg/dL (ref 0.0–1.2)
CO2: 21 mmol/L (ref 20–29)
Calcium: 9.2 mg/dL (ref 8.7–10.2)
Chloride: 103 mmol/L (ref 96–106)
Creatinine, Ser: 0.73 mg/dL — ABNORMAL LOW (ref 0.76–1.27)
Globulin, Total: 2.2 g/dL (ref 1.5–4.5)
Glucose: 94 mg/dL (ref 70–99)
Potassium: 5.2 mmol/L (ref 3.5–5.2)
Sodium: 142 mmol/L (ref 134–144)
Total Protein: 6.8 g/dL (ref 6.0–8.5)
eGFR: 107 mL/min/{1.73_m2} (ref >59)

## 2022-10-28 LAB — LIPID PANEL
Chol/HDL Ratio: 6.1 ratio — ABNORMAL HIGH (ref 0.0–5.0)
Cholesterol, Total: 274 mg/dL — ABNORMAL HIGH (ref 100–199)
HDL: 45 mg/dL (ref >39)
Triglycerides: 805 mg/dL (ref 0–149)

## 2022-10-28 LAB — CBC
Hematocrit: 50 % (ref 37.5–51.0)
Hemoglobin: 16.4 g/dL (ref 13.0–17.7)
MCH: 31.6 pg (ref 26.6–33.0)
MCHC: 32.8 g/dL (ref 31.5–35.7)
MCV: 96 fL (ref 79–97)
Platelets: 186 10*3/uL (ref 150–450)
RBC: 5.19 x10E6/uL (ref 4.14–5.80)
RDW: 12.5 % (ref 11.6–15.4)
WBC: 5.5 10*3/uL (ref 3.4–10.8)

## 2022-11-03 ENCOUNTER — Telehealth: Payer: Self-pay | Admitting: *Deleted

## 2022-11-03 ENCOUNTER — Ambulatory Visit (INDEPENDENT_AMBULATORY_CARE_PROVIDER_SITE_OTHER): Payer: BC Managed Care – PPO | Admitting: Internal Medicine

## 2022-11-03 ENCOUNTER — Encounter: Payer: Self-pay | Admitting: Internal Medicine

## 2022-11-03 VITALS — BP 119/74 | HR 82 | Ht 74.0 in | Wt 192.6 lb

## 2022-11-03 DIAGNOSIS — I480 Paroxysmal atrial fibrillation: Secondary | ICD-10-CM | POA: Diagnosis not present

## 2022-11-03 DIAGNOSIS — I10 Essential (primary) hypertension: Secondary | ICD-10-CM | POA: Diagnosis not present

## 2022-11-03 DIAGNOSIS — G8929 Other chronic pain: Secondary | ICD-10-CM

## 2022-11-03 DIAGNOSIS — M25571 Pain in right ankle and joints of right foot: Secondary | ICD-10-CM | POA: Diagnosis not present

## 2022-11-03 DIAGNOSIS — M25572 Pain in left ankle and joints of left foot: Secondary | ICD-10-CM

## 2022-11-03 DIAGNOSIS — E782 Mixed hyperlipidemia: Secondary | ICD-10-CM | POA: Diagnosis not present

## 2022-11-03 DIAGNOSIS — Z79899 Other long term (current) drug therapy: Secondary | ICD-10-CM

## 2022-11-03 MED ORDER — ATORVASTATIN CALCIUM 40 MG PO TABS: 40.0000 mg | ORAL_TABLET | Freq: Every day | ORAL | 3 refills | Status: DC

## 2022-11-03 MED ORDER — FISH OIL 1000 MG PO CPDR: 2000.0000 mg | DELAYED_RELEASE_CAPSULE | Freq: Two times a day (BID) | ORAL | 6 refills | Status: DC

## 2022-11-03 MED ORDER — LOSARTAN POTASSIUM 50 MG PO TABS: 50.0000 mg | ORAL_TABLET | Freq: Every day | ORAL | 1 refills | Status: DC

## 2022-11-03 NOTE — Progress Notes (Signed)
Established Patient Office Visit  Subjective:  Patient ID: Donald [name], male    DOB: [birth year]  Age: 57 y.o. MRN: [id number]  CC:  Chief Complaint  Patient presents with   Hypertension    Follow up    HPI Donald [name] is a 57 y.o. male with past medical history of AR s/p AV replacement, nonischemic CM, HTN, paroxysmal A Fib, gout and HLD who presents for f/u of his chronic medical conditions.  HTN and A Fib: His BP was well controlled today.  He takes losartan 50 mg QD.  He takes metoprolol for history of NICM and paroxysmal A-fib. Patient denies headache, dizziness, chest pain, dyspnea or palpitations.  He is on metoprolol and Xarelto currently.  HLD: His recent lipid profile showed significantly elevated triglyceride levels.  LDL level could not not be calculated.  He is currently taking atorvastatin 80 mg QD.  He reports that he had alcoholic beverages 2 days in a row prior to the blood test.  He has cut down alcohol use, and has cut down fried food.  He has also started taking fish oil 2000 mg twice daily.  He has chronic right foot pain from a bone spur.  He had right ankle and foot pain in 11/23, and had x-ray of the ankle and later MRI of the ankle.  It showed advanced tendinosis, and had podiatry evaluation.  He had steroid injection for it, which had improved his right foot pain. Of note, he has history of gout, and was started on allopurinol for it.  He had previous gout flareups in his ankle.  His last uric acid level was 4.3.  He does report occasional alcohol intake, but denies any binge drinking.  He had to wear work boots for 10-11 hours in a day.   Past Medical History:  Diagnosis Date   Acute gout of left foot 10/03/2019   Alcohol use 06/27/2019   Bone tumor 11/22/2013   Cardiomyopathy (HCC)    a. EF 30 to 35% by echo in 06/2019 in the setting of severe AI   COVID-19 04/16/2020   Encounter for screening for malignant neoplasm of colon 06/27/2019   Glenoid labral  tear 10/27/2010   History of gout 06/27/2019   Hyperlipidemia    Mass of soft tissue of foot    Patent foramen ovale    Rotator cuff tear, right 10/27/2010   S/P aortic valve replacement with bioprosthetic valve 08/09/2019   29 mm Edwards Inspiris Resilia stented bovine pericardial tissue valve   S/P patent foramen ovale closure 08/09/2019   Severe aortic insufficiency     Past Surgical History:  Procedure Laterality Date   AORTIC VALVE REPLACEMENT N/A 08/09/2019   Procedure: AORTIC VALVE REPLACEMENT (AVR) using Cyndee Brightly Resilia 29 MM Aortic Valve.;  Surgeon: Purcell Nails, MD;  Location: [facility] OR;  Service: Open Heart Surgery;  Laterality: N/A;   BACK SURGERY     multiple back surgery   Benign tumor     Left shin   BUBBLE STUDY  07/05/2019   Procedure: BUBBLE STUDY;  Surgeon: Jonelle Sidle, MD;  Location: [facility] ORS;  Service: Cardiovascular;;   CARDIAC VALVE REPLACEMENT N/A    Phreesia 02/19/2020   HERNIA REPAIR     REPAIR OF PATENT FORAMEN OVALE N/A 08/09/2019   Procedure: Closure Of Patent Foramen Ovale;  Surgeon: Purcell Nails, MD;  Location: [facility] OR;  Service: Open Heart Surgery;  Laterality: N/A;   RIGHT/LEFT HEART CATH  AND CORONARY ANGIOGRAPHY N/A 07/26/2019   Procedure: RIGHT/LEFT HEART CATH AND CORONARY ANGIOGRAPHY;  Surgeon: Kathleene Hazel, MD;  Location: [facility] INVASIVE CV LAB;  Service: Cardiovascular;  Laterality: N/A;   SHOULDER SURGERY Left    SHOULDER SURGERY Right    TEE WITHOUT CARDIOVERSION N/A 07/05/2019   Procedure: TRANSESOPHAGEAL ECHOCARDIOGRAM (TEE) WITH PROPOFOL;  Surgeon: Jonelle Sidle, MD;  Location: [facility] ORS;  Service: Cardiovascular;  Laterality: N/A;   TEE WITHOUT CARDIOVERSION N/A 08/09/2019   Procedure: TRANSESOPHAGEAL ECHOCARDIOGRAM (TEE);  Surgeon: Purcell Nails, MD;  Location: [facility] OR;  Service: Open Heart Surgery;  Laterality: N/A;   Thumb surgery Left x3   VASECTOMY N/A    Phreesia 02/19/2020    Family History  Problem  Relation Age of Onset   Other Father        Kidney    Social History   Socioeconomic History   Marital status: Married    Spouse name: Donald [name]    Number of children: 4   Years of education: Not on file   Highest education level: Some college, no degree  Occupational History   Occupation: Sport and exercise psychologist: [employer]  Tobacco Use   Smoking status: Every Day    Current packs/day: 0.00    Types: Cigarettes    Last attempt to quit: 08/02/2019    Years since quitting: 3.2   Smokeless tobacco: Never   Tobacco comments:    Smoking 9/10 ciggs per day   Vaping Use   Vaping status: Never Used  Substance and Sexual Activity   Alcohol use: Not Currently    Comment: "a little wine"   Drug use: No   Sexual activity: Not on file  Other Topics Concern   Not on file  Social History Narrative   Live Donald [name] wife of 2 years, previously was widowed from first wife from cancer 01/2011      Four children      Enjoy: golf, target shooting       Diet:    Caffeine: 16-20 oz coffee    Water: 6-7 bottles       Wears seat belt    Smoke detectors at home    Does not use phone while driving    Social Determinants of Health   Financial Resource Strain: Low Risk  (02/20/2020)   Overall Financial Resource Strain (CARDIA)    Difficulty of Paying Living Expenses: Not hard at all  Food Insecurity: No Food Insecurity (02/20/2020)   Hunger Vital Sign    Worried About Running Out of Food in the Last Year: Never true    Ran Out of Food in the Last Year: Never true  Transportation Needs: No Transportation Needs (02/20/2020)   PRAPARE - Administrator, Civil Service (Medical): No    Lack of Transportation (Non-Medical): No  Physical Activity: Sufficiently Active (02/20/2020)   Exercise Vital Sign    Days of Exercise per Week: 5 days    Minutes of Exercise per Session: 50 min  Stress: No Stress Concern Present (02/20/2020)   [employer] of Occupational Health -  Occupational Stress Questionnaire    Feeling of Stress : Not at all  Social Connections: Moderately Isolated (02/20/2020)   Social Connection and Isolation Panel [NHANES]    Frequency of Communication with Friends and Family: More than three times a week    Frequency of Social Gatherings with Friends and Family: Once a week    Attends Religious Services: Never  Active Member of Clubs or Organizations: No    Attends Banker Meetings: Never    Marital Status: Married  Catering manager Violence: Not At Risk (02/20/2020)   Humiliation, Afraid, Rape, and Kick questionnaire    Fear of Current or Ex-Partner: No    Emotionally Abused: No    Physically Abused: No    Sexually Abused: No    Outpatient Medications Prior to Visit  Medication Sig Dispense Refill   allopurinol (ZYLOPRIM) 100 MG tablet TAKE 1 TABLET BY MOUTH EVERY DAY 90 tablet 2   Cholecalciferol (VITAMIN D3) 50 MCG (2000 UT) TABS Take 2,000 Units by mouth daily.     Coenzyme Q10 (COQ10) 100 MG CAPS Take 100 mg by mouth daily.     Glucosamine-Chondroitin (GLUCOSAMINE CHONDR COMPLEX PO) Take 2 tablets by mouth every morning.     metoprolol succinate (TOPROL-XL) 50 MG 24 hr tablet TAKE 1 TABLET BY MOUTH EVERY DAY WITH OR IMMEDIATELY FOLLOWING A MEAL 90 tablet 3   Multiple Vitamin (MULTIVITAMIN WITH MINERALS) TABS tablet Take 1 tablet by mouth daily.     rivaroxaban (XARELTO) 20 MG TABS tablet TAKE 1 TABLET BY MOUTH DAILY WITH SUPPER. 90 tablet 0   atorvastatin (LIPITOR) 10 MG tablet TAKE 1 TABLET BY MOUTH EVERY DAY 90 tablet 1   losartan (COZAAR) 50 MG tablet Take 1 tablet (50 mg total) by mouth daily. 90 tablet 1   Omega-3 Fatty Acids (FISH OIL PO) Take 2,000 mg by mouth daily.      No facility-administered medications prior to visit.    Allergies  Allergen Reactions   Ultram [Tramadol Hcl] Itching    "Tunnel vision" warm feeling all over and wanted to jump out of his skin    ROS Review of Systems   Constitutional:  Negative for chills and fever.  HENT:  Negative for congestion, sinus pressure and sinus pain.   Eyes:  Negative for pain and discharge.  Respiratory:  Negative for cough and shortness of breath.   Cardiovascular:  Negative for chest pain and palpitations.  Gastrointestinal:  Negative for diarrhea, nausea and vomiting.  Genitourinary:  Negative for dysuria and hematuria.  Musculoskeletal:  Positive for arthralgias (B/l foot). Negative for neck pain and neck stiffness.  Skin:  Negative for rash.  Neurological:  Negative for weakness and numbness.  Psychiatric/Behavioral:  Negative for agitation and behavioral problems.       Objective:    Physical Exam Vitals reviewed.  Constitutional:      General: He is not in acute distress.    Appearance: He is not diaphoretic.  HENT:     Head: Normocephalic and atraumatic.     Nose: Nose normal.     Mouth/Throat:     Mouth: Mucous membranes are moist.  Eyes:     General: No scleral icterus.    Extraocular Movements: Extraocular movements intact.  Cardiovascular:     Rate and Rhythm: Normal rate and regular rhythm.     Pulses: Normal pulses.     Heart sounds: Normal heart sounds. No murmur heard. Pulmonary:     Breath sounds: Normal breath sounds. No wheezing or rales.  Genitourinary:    Penis: Normal.   Musculoskeletal:     Cervical back: Neck supple. No tenderness.     Right lower leg: No edema.     Left lower leg: No edema.  Skin:    General: Skin is warm.     Findings: No rash.  Neurological:  General: No focal deficit present.     Mental Status: He is alert and oriented to person, place, and time.     Sensory: No sensory deficit.     Motor: No weakness.  Psychiatric:        Mood and Affect: Mood normal.        Behavior: Behavior normal.     BP 119/74   Pulse 82   Ht 6\' 2"  (1.88 m)   Wt 192 lb 9.6 oz (87.4 kg)   SpO2 97%   BMI 24.73 kg/m  Wt Readings from Last 3 Encounters:  11/03/22 192 lb  9.6 oz (87.4 kg)  10/26/22 194 lb 3.2 oz (88.1 kg)  07/01/22 200 lb 6.4 oz (90.9 kg)    Lab Results  Component Value Date   TSH 1.540 02/26/2021   Lab Results  Component Value Date   WBC 5.5 10/27/2022   HGB 16.4 10/27/2022   HCT 50.0 10/27/2022   MCV 96 10/27/2022   PLT 186 10/27/2022   Lab Results  Component Value Date   NA 142 10/27/2022   K 5.2 10/27/2022   CO2 21 10/27/2022   GLUCOSE 94 10/27/2022   BUN 15 10/27/2022   CREATININE 0.73 (L) 10/27/2022   BILITOT 0.4 10/27/2022   ALKPHOS 77 10/27/2022   AST 28 10/27/2022   ALT 49 (H) 10/27/2022   PROT 6.8 10/27/2022   ALBUMIN 4.6 10/27/2022   CALCIUM 9.2 10/27/2022   ANIONGAP 6 05/21/2020   EGFR 107 10/27/2022   Lab Results  Component Value Date   CHOL 274 (H) 10/27/2022   Lab Results  Component Value Date   HDL 45 10/27/2022   Lab Results  Component Value Date   LDLCALC Comment (A) 10/27/2022   Lab Results  Component Value Date   TRIG 805 (HH) 10/27/2022   Lab Results  Component Value Date   CHOLHDL 6.1 (H) 10/27/2022   Lab Results  Component Value Date   HGBA1C 5.3 02/26/2021      Assessment & Plan:   Problem List Items Addressed This Visit       Cardiovascular and Mediastinum   Essential hypertension    BP Readings from Last 1 Encounters:  11/03/22 119/74   Well-controlled with Metoprolol and Losartan 50 mg QD Home BP around 130s/80s Counseled for compliance with the medications Advised DASH diet and moderate exercise/walking, at least 150 mins/week      Relevant Medications   losartan (COZAAR) 50 MG tablet   Paroxysmal atrial fibrillation (HCC) - Primary    Rate controlled with metoprolol On Xarelto for AC Followed by Cardiology      Relevant Medications   losartan (COZAAR) 50 MG tablet     Other   Hyperlipidemia    On statin Recent lipid profile reviewed -elevated TG, has started taking fish oil Continue to follow DASH diet      Relevant Medications   losartan  (COZAAR) 50 MG tablet   Chronic pain of both ankles    Likely due to bone spur Has been having alternating side foot pain, likely due to pressure on the other foot due to pain on one side Unable to take oral NSAIDs as he is on Xarelto Rest, ice and brace as tolerated He has orthotic support for his shoes Has seen podiatrist - right foot pain had improved with steroid injection, but recurred      Meds ordered this encounter  Medications   losartan (COZAAR) 50 MG tablet  Sig: Take 1 tablet (50 mg total) by mouth daily.    Dispense:  90 tablet    Refill:  1    Follow-up: Return in about 4 months (around 03/06/2023) for Annual physical.    Anabel Halon, MD

## 2022-11-03 NOTE — Patient Instructions (Addendum)
Please continue to take medications as prescribed. ? ?Please continue to follow low carb diet and perform moderate exercise/walking at least 150 mins/week. ?

## 2022-11-03 NOTE — Telephone Encounter (Addendum)
Patient informed and says he missed 8-9 days of lipitor and was fasting when labs were done. Advised of recommendations to increase lipitor to 40 mg daily and double fish oil to 2000 mg BID and repeating FLP in 4 weeks. Verbalized understanding of plan. Copy sent to PCP.

## 2022-11-03 NOTE — Telephone Encounter (Signed)
-----   Message from Jonelle Sidle, MD sent at 10/28/2022 11:16 AM EDT ----- Results reviewed. Please check to see if this was a fasting test. Triglycerides were very high at 805 (300's last check). Also total cholesterol 275 (189 previously). Has he been taking Lipitor regularly? Hemoglobin and renal function normal. Suggest double omega-3 to 2000 mg twice daily. Also increase Lipitor to 40 mg daily for now. Check FLP 4 weeks after these changes.

## 2022-11-05 NOTE — Assessment & Plan Note (Signed)
Likely due to bone spur Has been having alternating side foot pain, likely due to pressure on the other foot due to pain on one side Unable to take oral NSAIDs as he is on Xarelto Rest, ice and brace as tolerated He has orthotic support for his shoes Has seen podiatrist - right foot pain had improved with steroid injection, but recurred 

## 2022-11-05 NOTE — Assessment & Plan Note (Addendum)
On statin Recent lipid profile reviewed -elevated TG, has started taking fish oil Continue to follow DASH diet

## 2022-11-05 NOTE — Assessment & Plan Note (Signed)
Rate controlled with metoprolol On Xarelto for AC Followed by Cardiology 

## 2022-11-05 NOTE — Assessment & Plan Note (Signed)
BP Readings from Last 1 Encounters:  11/03/22 119/74   Well-controlled with Metoprolol and Losartan 50 mg QD Home BP around 130s/80s Counseled for compliance with the medications Advised DASH diet and moderate exercise/walking, at least 150 mins/week

## 2022-11-06 ENCOUNTER — Encounter (HOSPITAL_COMMUNITY): Payer: Self-pay

## 2022-11-06 ENCOUNTER — Observation Stay (HOSPITAL_COMMUNITY)
Admission: EM | Admit: 2022-11-06 | Discharge: 2022-11-07 | Disposition: A | Discharge status: Home / Self Care | Payer: BC Managed Care – PPO | Attending: Internal Medicine | Admitting: Internal Medicine

## 2022-11-06 ENCOUNTER — Emergency Department (HOSPITAL_COMMUNITY): Payer: BC Managed Care – PPO

## 2022-11-06 ENCOUNTER — Other Ambulatory Visit: Payer: Self-pay

## 2022-11-06 DIAGNOSIS — Z8616 Personal history of COVID-19: Secondary | ICD-10-CM | POA: Diagnosis not present

## 2022-11-06 DIAGNOSIS — J939 Pneumothorax, unspecified: Secondary | ICD-10-CM | POA: Diagnosis not present

## 2022-11-06 DIAGNOSIS — Z952 Presence of prosthetic heart valve: Secondary | ICD-10-CM | POA: Insufficient documentation

## 2022-11-06 DIAGNOSIS — Z79899 Other long term (current) drug therapy: Secondary | ICD-10-CM | POA: Insufficient documentation

## 2022-11-06 DIAGNOSIS — N200 Calculus of kidney: Secondary | ICD-10-CM | POA: Diagnosis not present

## 2022-11-06 DIAGNOSIS — Z7901 Long term (current) use of anticoagulants: Secondary | ICD-10-CM | POA: Diagnosis not present

## 2022-11-06 DIAGNOSIS — I480 Paroxysmal atrial fibrillation: Secondary | ICD-10-CM | POA: Insufficient documentation

## 2022-11-06 DIAGNOSIS — S2242XA Multiple fractures of ribs, left side, initial encounter for closed fracture: Secondary | ICD-10-CM

## 2022-11-06 DIAGNOSIS — S199XXA Unspecified injury of neck, initial encounter: Secondary | ICD-10-CM | POA: Diagnosis not present

## 2022-11-06 DIAGNOSIS — Z85830 Personal history of malignant neoplasm of bone: Secondary | ICD-10-CM | POA: Diagnosis not present

## 2022-11-06 DIAGNOSIS — M50222 Other cervical disc displacement at C5-C6 level: Secondary | ICD-10-CM | POA: Diagnosis not present

## 2022-11-06 DIAGNOSIS — I10 Essential (primary) hypertension: Secondary | ICD-10-CM | POA: Insufficient documentation

## 2022-11-06 DIAGNOSIS — N400 Enlarged prostate without lower urinary tract symptoms: Secondary | ICD-10-CM | POA: Diagnosis not present

## 2022-11-06 DIAGNOSIS — S299XXA Unspecified injury of thorax, initial encounter: Secondary | ICD-10-CM | POA: Diagnosis not present

## 2022-11-06 DIAGNOSIS — E785 Hyperlipidemia, unspecified: Secondary | ICD-10-CM | POA: Diagnosis present

## 2022-11-06 DIAGNOSIS — F17210 Nicotine dependence, cigarettes, uncomplicated: Secondary | ICD-10-CM | POA: Diagnosis not present

## 2022-11-06 DIAGNOSIS — I428 Other cardiomyopathies: Secondary | ICD-10-CM

## 2022-11-06 DIAGNOSIS — R079 Chest pain, unspecified: Secondary | ICD-10-CM | POA: Diagnosis not present

## 2022-11-06 DIAGNOSIS — S27321A Contusion of lung, unilateral, initial encounter: Secondary | ICD-10-CM

## 2022-11-06 DIAGNOSIS — S270XXA Traumatic pneumothorax, initial encounter: Principal | ICD-10-CM | POA: Insufficient documentation

## 2022-11-06 DIAGNOSIS — S0990XA Unspecified injury of head, initial encounter: Secondary | ICD-10-CM | POA: Insufficient documentation

## 2022-11-06 DIAGNOSIS — S3991XA Unspecified injury of abdomen, initial encounter: Secondary | ICD-10-CM | POA: Diagnosis not present

## 2022-11-06 LAB — I-STAT CHEM 8, ED
BUN: 22 mg/dL — ABNORMAL HIGH (ref 6–20)
Calcium, Ion: 1.22 mmol/L (ref 1.15–1.40)
Chloride: 106 mmol/L (ref 98–111)
Creatinine, Ser: 0.7 mg/dL (ref 0.61–1.24)
Glucose, Bld: 105 mg/dL — ABNORMAL HIGH (ref 70–99)
HCT: 45 % (ref 39.0–52.0)
Hemoglobin: 15.3 g/dL (ref 13.0–17.0)
Potassium: 4.2 mmol/L (ref 3.5–5.1)
Sodium: 139 mmol/L (ref 135–145)
TCO2: 24 mmol/L (ref 22–32)

## 2022-11-06 LAB — BASIC METABOLIC PANEL
Anion gap: 9 (ref 5–15)
BUN: 21 mg/dL — ABNORMAL HIGH (ref 6–20)
CO2: 22 mmol/L (ref 22–32)
Calcium: 9 mg/dL (ref 8.9–10.3)
Chloride: 103 mmol/L (ref 98–111)
Creatinine, Ser: 0.77 mg/dL (ref 0.61–1.24)
GFR, Estimated: >60 mL/min (ref >60)
Glucose, Bld: 100 mg/dL — ABNORMAL HIGH (ref 70–99)
Potassium: 3.9 mmol/L (ref 3.5–5.1)
Sodium: 134 mmol/L — ABNORMAL LOW (ref 135–145)

## 2022-11-06 LAB — CBC
HCT: 45.9 % (ref 39.0–52.0)
Hemoglobin: 15.7 g/dL (ref 13.0–17.0)
MCH: 33.1 pg (ref 26.0–34.0)
MCHC: 34.2 g/dL (ref 30.0–36.0)
MCV: 96.8 fL (ref 80.0–100.0)
Platelets: 161 10*3/uL (ref 150–400)
RBC: 4.74 MIL/uL (ref 4.22–5.81)
RDW: 11.9 % (ref 11.5–15.5)
WBC: 13 10*3/uL — ABNORMAL HIGH (ref 4.0–10.5)
nRBC: 0 % (ref 0.0–0.2)

## 2022-11-06 MED ORDER — IOHEXOL 300 MG/ML  SOLN
100.0000 mL | Freq: Once | INTRAMUSCULAR | Status: AC | PRN
  Administered 2022-11-06: 100 mL via INTRAVENOUS

## 2022-11-06 MED ORDER — CHLORHEXIDINE GLUCONATE CLOTH 2 % EX PADS
6.0000 | MEDICATED_PAD | Freq: Every day | CUTANEOUS | Status: DC
  Administered 2022-11-07: 6 via TOPICAL

## 2022-11-06 MED ORDER — ACETAMINOPHEN 325 MG PO TABS
650.0000 mg | ORAL_TABLET | ORAL | Status: DC
  Administered 2022-11-06 – 2022-11-07 (×3): 650 mg via ORAL
  Filled 2022-11-06 (×4): qty 2

## 2022-11-06 MED ORDER — OXYCODONE-ACETAMINOPHEN 5-325 MG PO TABS
1.0000 | ORAL_TABLET | Freq: Once | ORAL | Status: AC
  Administered 2022-11-06: 1 via ORAL
  Filled 2022-11-06: qty 1

## 2022-11-06 NOTE — ED Notes (Signed)
Registration called to report pt had informed them he decided to be admitted after talking on phone with his wife, pt didn't leave hospital- nurse will undo discharge- Dr Silvestre Moment, PA, Herbert Seta, [facility] and Ginger, RN aware. Pt requesting food- will check orders.
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
## 2022-11-06 NOTE — ED Notes (Signed)
Pt given sandwich bag and beverage

## 2022-11-06 NOTE — ED Notes (Signed)
Pt ate all of sandwich bag meal and beverage

## 2022-11-06 NOTE — ED Notes (Signed)
[place], [place] talked with pt, pt has decided to leave AMA- ice water given to pt- instructed pt to come back if he gets SOB or difficulty breathing,. Pt verbalized understanding.

## 2022-11-06 NOTE — ED Notes (Signed)
Dr Excell Seltzer at bedside talking with pt about results-
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
## 2022-11-07 ENCOUNTER — Observation Stay (HOSPITAL_COMMUNITY): Payer: BC Managed Care – PPO

## 2022-11-07 ENCOUNTER — Other Ambulatory Visit: Payer: Self-pay | Admitting: Nurse Practitioner

## 2022-11-07 DIAGNOSIS — S270XXA Traumatic pneumothorax, initial encounter: Secondary | ICD-10-CM | POA: Diagnosis not present

## 2022-11-07 DIAGNOSIS — S2242XA Multiple fractures of ribs, left side, initial encounter for closed fracture: Secondary | ICD-10-CM

## 2022-11-07 DIAGNOSIS — R918 Other nonspecific abnormal finding of lung field: Secondary | ICD-10-CM | POA: Diagnosis not present

## 2022-11-07 DIAGNOSIS — J939 Pneumothorax, unspecified: Secondary | ICD-10-CM | POA: Diagnosis not present

## 2022-11-07 LAB — CBC
HCT: 42.5 % (ref 39.0–52.0)
Hemoglobin: 14.6 g/dL (ref 13.0–17.0)
MCH: 33 pg (ref 26.0–34.0)
MCHC: 34.4 g/dL (ref 30.0–36.0)
MCV: 96.2 fL (ref 80.0–100.0)
Platelets: 148 10*3/uL — ABNORMAL LOW (ref 150–400)
RBC: 4.42 MIL/uL (ref 4.22–5.81)
RDW: 11.9 % (ref 11.5–15.5)
WBC: 7.9 10*3/uL (ref 4.0–10.5)
nRBC: 0 % (ref 0.0–0.2)

## 2022-11-07 LAB — BASIC METABOLIC PANEL
Anion gap: 6 (ref 5–15)
BUN: 18 mg/dL (ref 6–20)
CO2: 23 mmol/L (ref 22–32)
Calcium: 8.6 mg/dL — ABNORMAL LOW (ref 8.9–10.3)
Chloride: 106 mmol/L (ref 98–111)
Creatinine, Ser: 0.72 mg/dL (ref 0.61–1.24)
GFR, Estimated: >60 mL/min (ref >60)
Glucose, Bld: 103 mg/dL — ABNORMAL HIGH (ref 70–99)
Potassium: 4.1 mmol/L (ref 3.5–5.1)
Sodium: 135 mmol/L (ref 135–145)

## 2022-11-07 LAB — MAGNESIUM: Magnesium: 2 mg/dL (ref 1.7–2.4)

## 2022-11-07 MED ORDER — HYDROMORPHONE HCL 1 MG/ML IJ SOLN: 0.5000 mg | INTRAMUSCULAR | Status: DC | PRN

## 2022-11-07 MED ORDER — METHOCARBAMOL 500 MG PO TABS
500.0000 mg | ORAL_TABLET | Freq: Four times a day (QID) | ORAL | Status: DC | PRN
  Administered 2022-11-07: 500 mg via ORAL
  Filled 2022-11-07: qty 1

## 2022-11-07 MED ORDER — LIDOCAINE 5 % EX PTCH
1.0000 | MEDICATED_PATCH | CUTANEOUS | Status: DC
  Administered 2022-11-07: 1 via TRANSDERMAL
  Filled 2022-11-07: qty 1

## 2022-11-07 MED ORDER — OMEGA-3-ACID ETHYL ESTERS 1 G PO CAPS
2000.0000 mg | ORAL_CAPSULE | Freq: Two times a day (BID) | ORAL | Status: DC
  Administered 2022-11-07: 2000 mg via ORAL
  Filled 2022-11-07: qty 2

## 2022-11-07 MED ORDER — ACETAMINOPHEN 500 MG PO TABS
1000.0000 mg | ORAL_TABLET | Freq: Four times a day (QID) | ORAL | Status: DC
  Administered 2022-11-07: 1000 mg via ORAL
  Filled 2022-11-07: qty 2

## 2022-11-07 MED ORDER — OXYCODONE HCL 5 MG PO TABS: 5.0000 mg | ORAL_TABLET | ORAL | Status: DC | PRN

## 2022-11-07 MED ORDER — ALLOPURINOL 100 MG PO TABS
100.0000 mg | ORAL_TABLET | Freq: Every day | ORAL | Status: DC
  Administered 2022-11-07: 100 mg via ORAL
  Filled 2022-11-07: qty 1

## 2022-11-07 MED ORDER — METHOCARBAMOL 500 MG PO TABS: 500.0000 mg | ORAL_TABLET | Freq: Four times a day (QID) | ORAL | 0 refills | Status: DC | PRN

## 2022-11-07 MED ORDER — HYDROCODONE-ACETAMINOPHEN 5-325 MG PO TABS
1.0000 | ORAL_TABLET | Freq: Four times a day (QID) | ORAL | Status: DC | PRN
  Administered 2022-11-07: 2 via ORAL
  Filled 2022-11-07: qty 2

## 2022-11-07 MED ORDER — ACETAMINOPHEN 500 MG PO TABS: 1000.0000 mg | ORAL_TABLET | Freq: Four times a day (QID) | ORAL | 0 refills | Status: DC

## 2022-11-07 MED ORDER — RIVAROXABAN 20 MG PO TABS: 20.0000 mg | ORAL_TABLET | Freq: Every day | ORAL | Status: DC

## 2022-11-07 MED ORDER — HYDROMORPHONE HCL 1 MG/ML IJ SOLN
0.5000 mg | INTRAMUSCULAR | Status: DC | PRN
  Administered 2022-11-07: 0.5 mg via INTRAVENOUS
  Filled 2022-11-07: qty 0.5

## 2022-11-07 MED ORDER — ATORVASTATIN CALCIUM 40 MG PO TABS
40.0000 mg | ORAL_TABLET | Freq: Every day | ORAL | Status: DC
  Administered 2022-11-07: 40 mg via ORAL
  Filled 2022-11-07: qty 1

## 2022-11-07 MED ORDER — LOSARTAN POTASSIUM 50 MG PO TABS
50.0000 mg | ORAL_TABLET | Freq: Every day | ORAL | Status: DC
  Administered 2022-11-07: 50 mg via ORAL
  Filled 2022-11-07: qty 1

## 2022-11-07 MED ORDER — OXYCODONE HCL 5 MG PO TABS: 5.0000 mg | ORAL_TABLET | ORAL | 0 refills | Status: DC | PRN

## 2022-11-07 MED ORDER — LIDOCAINE 5 % EX PTCH: 1.0000 | MEDICATED_PATCH | CUTANEOUS | 0 refills | Status: DC

## 2022-11-07 MED ORDER — METOPROLOL SUCCINATE ER 50 MG PO TB24
50.0000 mg | ORAL_TABLET | Freq: Every day | ORAL | Status: DC
  Administered 2022-11-07: 50 mg via ORAL
  Filled 2022-11-07: qty 1

## 2022-11-07 MED ORDER — METHOCARBAMOL 1000 MG/10ML IJ SOLN
500.0000 mg | Freq: Four times a day (QID) | INTRAVENOUS | Status: DC | PRN
  Filled 2022-11-07: qty 5
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
## 2022-11-07 NOTE — Progress Notes (Unsigned)
{  Select_TRH_[id number]} 
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
## 2022-11-07 NOTE — Progress Notes (Signed)
Pt has discharge orders, discharge teaching given and no further questions at this time.  ?
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
## 2022-11-08 ENCOUNTER — Telehealth: Payer: Self-pay

## 2022-11-08 ENCOUNTER — Telehealth: Payer: Self-pay | Admitting: Internal Medicine

## 2022-11-08 NOTE — Telephone Encounter (Signed)
 Toc call made

## 2022-11-08 NOTE — Telephone Encounter (Signed)
Transition Care Management Follow-up Telephone Call Date of discharge and from where: 11/07/2022 from [facility] How have you been since you were released from the hospital? In a lot of pain still Any questions or concerns? No  Items Reviewed: Did the pt receive and understand the discharge instructions provided? Yes  Medications obtained and verified? No  Other? No  Any new allergies since your discharge? No  Dietary orders reviewed? Yes Do you have support at home? Yes   Home Care and Equipment/Supplies: Were home health services ordered? no If so, what is the name of the agency? N/A  Has the agency set up a time to come to the patient's home? not applicable Were any new equipment or medical supplies ordered?  No What is the name of the medical supply agency? N/A Were you able to get the supplies/equipment? not applicable Do you have any questions related to the use of the equipment or supplies? No  Functional Questionnaire: (I = Independent and D = Dependent) ADLs: I  Bathing/Dressing- I  Meal Prep- I  Eating- I  Maintaining continence- I  Transferring/Ambulation- I  Managing Meds- I  Follow up appointments reviewed:  PCP Hospital f/u appt confirmed? Yes  Scheduled to see Barbaraann Faster on 11/09/22 @ 10:00. Specialist Hospital f/u appt confirmed? No  Scheduled to see N/A on N/A @ N/A. Are transportation arrangements needed? No  If their condition worsens, is the pt aware to call PCP or go to the Emergency Dept.? Yes Was the patient provided with contact information for the PCP's office or ED? Yes Was to pt encouraged to call back with questions or concerns? Yes

## 2022-11-08 NOTE — Telephone Encounter (Signed)
TOC call discharge from hospital 07.14.2024 and has an [facility] appointment with [facility] on 07.16.2024

## 2022-11-09 ENCOUNTER — Encounter: Payer: Self-pay | Admitting: Internal Medicine

## 2022-11-09 ENCOUNTER — Ambulatory Visit (INDEPENDENT_AMBULATORY_CARE_PROVIDER_SITE_OTHER): Payer: BC Managed Care – PPO | Admitting: Internal Medicine

## 2022-11-09 VITALS — BP 102/67 | HR 79 | Ht 74.0 in | Wt 192.0 lb

## 2022-11-09 DIAGNOSIS — S2242XD Multiple fractures of ribs, left side, subsequent encounter for fracture with routine healing: Secondary | ICD-10-CM | POA: Diagnosis not present

## 2022-11-09 DIAGNOSIS — N400 Enlarged prostate without lower urinary tract symptoms: Secondary | ICD-10-CM

## 2022-11-09 MED ORDER — METHOCARBAMOL 500 MG PO TABS
500.0000 mg | ORAL_TABLET | Freq: Four times a day (QID) | ORAL | 0 refills | Status: DC | PRN
Start: 2022-11-09 — End: 2023-05-17

## 2022-11-09 MED ORDER — OXYCODONE HCL 5 MG PO TABS
5.0000 mg | ORAL_TABLET | ORAL | 0 refills | Status: AC | PRN
Start: 2022-11-09 — End: 2022-11-14

## 2022-11-09 NOTE — Patient Instructions (Signed)
Thank you, [name] for allowing [place] to provide your care today.    I have ordered the following labs for you:   Lab Orders         PSA        Reminders: Go by lab. Next appointment scheduled in November with Allena Katz.     Thurmon Fair, M.D.

## 2022-11-09 NOTE — Progress Notes (Unsigned)
   HPI:[name] is a 57 y.o. male who presents for evaluation of Transitions Of Care (D/c 11/07/2022.) .   Physical Exam: Vitals:   11/09/22 1004  BP: 102/67  Pulse: 79  SpO2: 92%  Weight: 192 lb 0.6 oz (87.1 kg)  Height: 6\' 2"  (1.88 m)     Physical Exam   Assessment & Plan:   There are no diagnoses linked to this encounter.    Donald Banister, MD

## 2022-11-10 DIAGNOSIS — N400 Enlarged prostate without lower urinary tract symptoms: Secondary | ICD-10-CM | POA: Insufficient documentation

## 2022-11-10 LAB — PSA: Prostate Specific Ag, Serum: 0.7 ng/mL (ref 0.0–4.0)

## 2022-11-10 MED ORDER — SENNOSIDES-DOCUSATE SODIUM 8.6-50 MG PO TABS
1.0000 | ORAL_TABLET | Freq: Two times a day (BID) | ORAL | 0 refills | Status: DC | PRN
Start: 2022-11-10 — End: 2023-05-27

## 2022-11-10 NOTE — Assessment & Plan Note (Signed)
Acute problem, stable Continue to have significant pain and stiffness.  Recommend taking schedule tylenol 1000 mg q6h - methocarbamol (ROBAXIN) 500 MG tablet; Take 1 tablet (500 mg total) by mouth every 6 (six) hours as needed for muscle spasms.  Dispense: 30 tablet; Refill: 0 - oxyCODONE (OXY IR/ROXICODONE) 5 MG immediate release tablet; Take 1-2 tablets (5-10 mg total) by mouth every 4 (four) hours as needed for up to 5 days for moderate pain, severe pain or breakthrough pain.  Dispense: 20 tablet; Refill: 0

## 2022-11-10 NOTE — Assessment & Plan Note (Signed)
Enlarged prostate on CT abdomen and pelvis 11/06/2022 Normal PSA 02/2021 Check PSA

## 2022-11-12 ENCOUNTER — Encounter: Payer: Self-pay | Admitting: Internal Medicine

## 2022-11-17 ENCOUNTER — Other Ambulatory Visit: Payer: Self-pay | Admitting: Internal Medicine

## 2022-11-17 ENCOUNTER — Ambulatory Visit: Payer: BC Managed Care – PPO | Admitting: Student

## 2022-11-17 DIAGNOSIS — E349 Endocrine disorder, unspecified: Secondary | ICD-10-CM

## 2022-11-19 DIAGNOSIS — E349 Endocrine disorder, unspecified: Secondary | ICD-10-CM | POA: Diagnosis not present

## 2022-11-22 ENCOUNTER — Other Ambulatory Visit: Payer: Self-pay | Admitting: Internal Medicine

## 2022-11-22 DIAGNOSIS — E349 Endocrine disorder, unspecified: Secondary | ICD-10-CM

## 2022-12-05 ENCOUNTER — Other Ambulatory Visit: Payer: Self-pay | Admitting: Cardiology

## 2022-12-06 NOTE — Telephone Encounter (Signed)
Prescription refill request for Xarelto received.  Indication: PAF Last office visit: 10/26/22  Donald Bible MD Weight: 88.1kg Age: 57  Scr: 0.72 on 11/07/22  Epic CrCl: 144.45  Based on above findings Xarelto 20mg  daily is the appropriate dose.  Refill approved.

## 2022-12-08 DIAGNOSIS — E349 Endocrine disorder, unspecified: Secondary | ICD-10-CM | POA: Diagnosis not present

## 2022-12-10 ENCOUNTER — Encounter: Payer: Self-pay | Admitting: Internal Medicine

## 2022-12-22 ENCOUNTER — Encounter: Payer: Self-pay | Admitting: Internal Medicine

## 2022-12-22 ENCOUNTER — Ambulatory Visit (INDEPENDENT_AMBULATORY_CARE_PROVIDER_SITE_OTHER): Payer: BC Managed Care – PPO | Admitting: Internal Medicine

## 2022-12-22 VITALS — BP 116/75 | HR 64 | Ht 74.0 in | Wt 197.4 lb

## 2022-12-22 DIAGNOSIS — S270XXD Traumatic pneumothorax, subsequent encounter: Secondary | ICD-10-CM | POA: Diagnosis not present

## 2022-12-22 DIAGNOSIS — E782 Mixed hyperlipidemia: Secondary | ICD-10-CM | POA: Diagnosis not present

## 2022-12-22 DIAGNOSIS — E349 Endocrine disorder, unspecified: Secondary | ICD-10-CM

## 2022-12-22 MED ORDER — TESTOSTERONE 20.25 MG/1.25GM (1.62%) TD GEL
TRANSDERMAL | 2 refills | Status: DC
Start: 2022-12-22 — End: 2023-03-28

## 2022-12-22 NOTE — Assessment & Plan Note (Signed)
On statin Recent lipid profile reviewed -elevated TG, has started taking fish oil Continue to follow DASH diet Check lipid profile

## 2022-12-22 NOTE — Assessment & Plan Note (Signed)
Reports mild fatigue Checked total and free testosterone - low on 2 separate blood tests Discussed  testosterone replacement therapy -prescribed AndroGel for better safety profile and easier access Would avoid testosterone injections due to his cardiac history
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
## 2022-12-22 NOTE — Patient Instructions (Addendum)
Make sure that you wash your hands with soap and water before and after applying the gel. Apply AndroGel to a clean, dry, intact skin of the shoulders, upper arms.  Allow the gel to dry on your skin before you cover it with clothing (eg, shorts, pants, t-shirt). Wait for at least 2 hours after applying this medicine before showering or swimming.
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
## 2022-12-29 ENCOUNTER — Encounter: Payer: Self-pay | Admitting: Internal Medicine

## 2022-12-30 ENCOUNTER — Other Ambulatory Visit: Payer: Self-pay | Admitting: Cardiology

## 2022-12-30 ENCOUNTER — Encounter: Payer: Self-pay | Admitting: Cardiology

## 2022-12-30 MED ORDER — METOPROLOL SUCCINATE ER 50 MG PO TB24: 50.0000 mg | ORAL_TABLET | Freq: Every day | ORAL | 3 refills | Status: DC

## 2023-01-19 ENCOUNTER — Other Ambulatory Visit: Payer: Self-pay | Admitting: Internal Medicine

## 2023-01-19 DIAGNOSIS — I10 Essential (primary) hypertension: Secondary | ICD-10-CM

## 2023-03-08 ENCOUNTER — Encounter: Payer: BC Managed Care – PPO | Admitting: Internal Medicine

## 2023-03-11 ENCOUNTER — Encounter: Payer: Self-pay | Admitting: Internal Medicine

## 2023-03-16 ENCOUNTER — Other Ambulatory Visit: Payer: Self-pay | Admitting: Cardiology

## 2023-03-25 ENCOUNTER — Other Ambulatory Visit: Payer: Self-pay | Admitting: Internal Medicine

## 2023-03-25 DIAGNOSIS — E349 Endocrine disorder, unspecified: Secondary | ICD-10-CM

## 2023-04-21 ENCOUNTER — Other Ambulatory Visit: Payer: Self-pay | Admitting: Internal Medicine

## 2023-04-21 DIAGNOSIS — I10 Essential (primary) hypertension: Secondary | ICD-10-CM

## 2023-05-10 ENCOUNTER — Other Ambulatory Visit: Payer: Self-pay | Admitting: Cardiology

## 2023-05-10 NOTE — Telephone Encounter (Signed)
 Prescription refill request for Xarelto  received.  Indication: AF Last office visit: 10/26/22  GORMAN Sierras MD Weight: 88.1kg Age: 57 Scr: 0.72 on 11/07/22  Epic CrCl: 142.75  Based on above findings Xarelto  20mg  daily is the appropriate dose.  Refill approved.

## 2023-05-15 ENCOUNTER — Other Ambulatory Visit: Payer: Self-pay | Admitting: Internal Medicine

## 2023-05-15 DIAGNOSIS — E349 Endocrine disorder, unspecified: Secondary | ICD-10-CM

## 2023-05-17 ENCOUNTER — Ambulatory Visit (INDEPENDENT_AMBULATORY_CARE_PROVIDER_SITE_OTHER): Payer: BC Managed Care – PPO | Admitting: Internal Medicine

## 2023-05-17 ENCOUNTER — Encounter: Payer: Self-pay | Admitting: Internal Medicine

## 2023-05-17 VITALS — BP 138/88 | HR 87 | Ht 74.0 in | Wt 197.6 lb

## 2023-05-17 DIAGNOSIS — M1A0790 Idiopathic chronic gout, unspecified ankle and foot, without tophus (tophi): Secondary | ICD-10-CM

## 2023-05-17 DIAGNOSIS — R739 Hyperglycemia, unspecified: Secondary | ICD-10-CM | POA: Diagnosis not present

## 2023-05-17 DIAGNOSIS — Z79890 Hormone replacement therapy (postmenopausal): Secondary | ICD-10-CM | POA: Diagnosis not present

## 2023-05-17 DIAGNOSIS — E349 Endocrine disorder, unspecified: Secondary | ICD-10-CM

## 2023-05-17 DIAGNOSIS — E559 Vitamin D deficiency, unspecified: Secondary | ICD-10-CM | POA: Diagnosis not present

## 2023-05-17 DIAGNOSIS — Z1211 Encounter for screening for malignant neoplasm of colon: Secondary | ICD-10-CM

## 2023-05-17 DIAGNOSIS — Z5181 Encounter for therapeutic drug level monitoring: Secondary | ICD-10-CM

## 2023-05-17 DIAGNOSIS — I10 Essential (primary) hypertension: Secondary | ICD-10-CM

## 2023-05-17 DIAGNOSIS — E538 Deficiency of other specified B group vitamins: Secondary | ICD-10-CM

## 2023-05-17 DIAGNOSIS — I480 Paroxysmal atrial fibrillation: Secondary | ICD-10-CM

## 2023-05-17 NOTE — Assessment & Plan Note (Addendum)
BP Readings from Last 1 Encounters:  05/17/23 138/88   Well-controlled with Metoprolol and Losartan 50 mg once daily at home Home BP around 130s/80s Counseled for compliance with the medications Advised DASH diet and moderate exercise/walking, at least 150 mins/week

## 2023-05-17 NOTE — Assessment & Plan Note (Addendum)
Reports mild fatigue Checked total and free testosterone - low on 2 separate blood tests Discussed  testosterone replacement therapy -prescribed AndroGel for better safety profile and easier access Would avoid testosterone injections for now - if testosterone level still low, would refer to Urology for testosterone injectable treatment

## 2023-05-17 NOTE — Patient Instructions (Signed)
Please continue to take medications as prescribed.  Please continue to follow low salt diet and perform moderate exercise/walking at least 150 mins/week. 

## 2023-05-17 NOTE — Assessment & Plan Note (Signed)
Takes allopurinol 100 mg daily

## 2023-05-17 NOTE — Assessment & Plan Note (Signed)
Rate controlled with metoprolol On Xarelto for AC Followed by Cardiology 

## 2023-05-17 NOTE — Progress Notes (Signed)
Established Patient Office Visit  Subjective:  Patient ID: Donald [name], male    DOB: Feb 09, 1966  Age: 58 y.o. MRN: [id number]  CC:  Chief Complaint  Patient presents with   Care Management    Pt would like a check up for testosterone levels.    HPI Donald [name] is a 58 y.o. male with past medical history of AR s/p AV replacement, nonischemic CM, HTN, paroxysmal A Fib, gout and HLD who presents for f/u of chronic medical conditions.  HTN and A Fib: His BP was elevated today, but reports better readings in 130s/80s at home.  He takes losartan 50 mg QD.  He takes metoprolol for history of NICM and paroxysmal A-fib. Patient denies headache, dizziness, chest pain, dyspnea or palpitations.  He is on metoprolol and Xarelto currently.  HLD: His recent lipid profile showed significantly elevated triglyceride levels.  LDL level could not not be calculated.  He is currently taking atorvastatin 80 mg QD.  He reports that he had alcoholic beverages 2 days in a row prior to the blood test.  He has cut down alcohol use, and has cut down fried food.  He has also started taking fish oil 2000 mg twice daily.  Testosterone deficiency: He has been using testosterone gel for the last 4 months.  He has noticed mild improvement in fatigue, but still reports lack of sexual drive.  He has talked to his friends who take testosterone injection treatment and seems more interested for it.   Past Medical History:  Diagnosis Date   Acute gout of left foot 10/03/2019   Alcohol use 06/27/2019   Bone tumor 11/22/2013   Cardiomyopathy (HCC)    a. EF 30 to 35% by echo in 06/2019 in the setting of severe AI   COVID-19 04/16/2020   Encounter for screening for malignant neoplasm of colon 06/27/2019   Glenoid labral tear 10/27/2010   History of gout 06/27/2019   Hyperlipidemia    Mass of soft tissue of foot    Patent foramen ovale    Rotator cuff tear, right 10/27/2010   S/P aortic valve replacement with  bioprosthetic valve 08/09/2019   29 mm Edwards Inspiris Resilia stented bovine pericardial tissue valve   S/P patent foramen ovale closure 08/09/2019   Severe aortic insufficiency     Past Surgical History:  Procedure Laterality Date   AORTIC VALVE REPLACEMENT N/A 08/09/2019   Procedure: AORTIC VALVE REPLACEMENT (AVR) using Cyndee Brightly Resilia 29 MM Aortic Valve.;  Surgeon: Purcell Nails, MD;  Location: [facility] OR;  Service: Open Heart Surgery;  Laterality: N/A;   BACK SURGERY     multiple back surgery   Benign tumor     Left shin   BUBBLE STUDY  07/05/2019   Procedure: BUBBLE STUDY;  Surgeon: Jonelle Sidle, MD;  Location: [facility] ORS;  Service: Cardiovascular;;   CARDIAC VALVE REPLACEMENT N/A    Phreesia 02/19/2020   HERNIA REPAIR     REPAIR OF PATENT FORAMEN OVALE N/A 08/09/2019   Procedure: Closure Of Patent Foramen Ovale;  Surgeon: Purcell Nails, MD;  Location: [facility] OR;  Service: Open Heart Surgery;  Laterality: N/A;   RIGHT/LEFT HEART CATH AND CORONARY ANGIOGRAPHY N/A 07/26/2019   Procedure: RIGHT/LEFT HEART CATH AND CORONARY ANGIOGRAPHY;  Surgeon: Kathleene Hazel, MD;  Location: [facility] INVASIVE CV LAB;  Service: Cardiovascular;  Laterality: N/A;   SHOULDER SURGERY Left    SHOULDER SURGERY Right    TEE WITHOUT CARDIOVERSION N/A 07/05/2019  Procedure: TRANSESOPHAGEAL ECHOCARDIOGRAM (TEE) WITH PROPOFOL;  Surgeon: Jonelle Sidle, MD;  Location: [facility] ORS;  Service: Cardiovascular;  Laterality: N/A;   TEE WITHOUT CARDIOVERSION N/A 08/09/2019   Procedure: TRANSESOPHAGEAL ECHOCARDIOGRAM (TEE);  Surgeon: Purcell Nails, MD;  Location: [facility] OR;  Service: Open Heart Surgery;  Laterality: N/A;   Thumb surgery Left x3   VASECTOMY N/A    Phreesia 02/19/2020    Family History  Problem Relation Age of Onset   Other Father        Kidney    Social History   Socioeconomic History   Marital status: Married    Spouse name: [name]    Number of children: 4   Years of education: Not on  file   Highest education level: Some college, no degree  Occupational History   Occupation: Sport and exercise psychologist: [employer]  Tobacco Use   Smoking status: Every Day    Current packs/day: 0.00    Types: Cigarettes    Last attempt to quit: 08/02/2019    Years since quitting: 3.7   Smokeless tobacco: Never   Tobacco comments:    Smoking 9/10 ciggs per day   Vaping Use   Vaping status: Never Used  Substance and Sexual Activity   Alcohol use: Not Currently    Comment: "a little wine"   Drug use: No   Sexual activity: Not on file  Other Topics Concern   Not on file  Social History Narrative   Live [name] wife of 2 years, previously was widowed from first wife from cancer 01/2011      Four children      Enjoy: golf, target shooting       Diet:    Caffeine: 16-20 oz coffee    Water: 6-7 bottles       Wears seat belt    Smoke detectors at home    Does not use phone while driving    Social Drivers of Health   Financial Resource Strain: Low Risk  (02/20/2020)   Overall Financial Resource Strain (CARDIA)    Difficulty of Paying Living Expenses: Not hard at all  Food Insecurity: No Food Insecurity (11/06/2022)   Hunger Vital Sign    Worried About Running Out of Food in the Last Year: Never true    Ran Out of Food in the Last Year: Never true  Transportation Needs: No Transportation Needs (11/06/2022)   PRAPARE - Administrator, Civil Service (Medical): No    Lack of Transportation (Non-Medical): No  Physical Activity: Sufficiently Active (02/20/2020)   Exercise Vital Sign    Days of Exercise per Week: 5 days    Minutes of Exercise per Session: 50 min  Stress: No Stress Concern Present (02/20/2020)   [employer] of Occupational Health - Occupational Stress Questionnaire    Feeling of Stress : Not at all  Social Connections: Moderately Isolated (02/20/2020)   Social Connection and Isolation Panel [NHANES]    Frequency of Communication with  Friends and Family: More than three times a week    Frequency of Social Gatherings with Friends and Family: Once a week    Attends Religious Services: Never    Database administrator or Organizations: No    Attends Banker Meetings: Never    Marital Status: Married  Catering manager Violence: Not At Risk (11/06/2022)   Humiliation, Afraid, Rape, and Kick questionnaire    Fear of Current or Ex-Partner: No  Emotionally Abused: No    Physically Abused: No    Sexually Abused: No    Outpatient Medications Prior to Visit  Medication Sig Dispense Refill   acetaminophen (TYLENOL) 500 MG tablet Take 2 tablets (1,000 mg total) by mouth every 6 (six) hours. 30 tablet 0   allopurinol (ZYLOPRIM) 100 MG tablet TAKE 1 TABLET BY MOUTH EVERY DAY 90 tablet 2   atorvastatin (LIPITOR) 40 MG tablet Take 1 tablet (40 mg total) by mouth daily. 90 tablet 3   Cholecalciferol (VITAMIN D3) 50 MCG (2000 UT) TABS Take 2,000 Units by mouth daily.     Coenzyme Q10 (COQ10) 100 MG CAPS Take 100 mg by mouth daily.     Glucosamine-Chondroitin (GLUCOSAMINE CHONDR COMPLEX PO) Take 2 tablets by mouth every morning.     lidocaine (LIDODERM) 5 % Place 1 patch onto the skin daily. Remove & Discard patch within 12 hours or as directed by MD 30 patch 0   losartan (COZAAR) 50 MG tablet Take 1 tablet (50 mg total) by mouth daily. 90 tablet 1   metoprolol succinate (TOPROL-XL) 50 MG 24 hr tablet Take 1 tablet (50 mg total) by mouth daily. Take with or immediately following a meal. 90 tablet 3   Multiple Vitamin (MULTIVITAMIN WITH MINERALS) TABS tablet Take 1 tablet by mouth daily.     omega-3 acid ethyl esters (LOVAZA) 1 g capsule TAKE 2,000 MG BY MOUTH 2 (TWO) TIMES DAILY. 180 capsule 3   senna-docusate (SENOKOT-S) 8.6-50 MG tablet Take 1 tablet by mouth 2 (two) times daily as needed for mild constipation (Take while taking opioid pain medicatoin). 30 tablet 0   Testosterone 20.25 MG/ACT (1.62%) GEL PLACE 2 PUMPS OVER  THE SHOULDER AREA ONCE DAILY 75 g 2   XARELTO 20 MG TABS tablet TAKE 1 TABLET BY MOUTH DAILY WITH SUPPER 90 tablet 1   losartan (COZAAR) 25 MG tablet TAKE 1 TABLET (25 MG TOTAL) BY MOUTH DAILY. 90 tablet 0   methocarbamol (ROBAXIN) 500 MG tablet Take 1 tablet (500 mg total) by mouth every 6 (six) hours as needed for muscle spasms. 30 tablet 0   No facility-administered medications prior to visit.    Allergies  Allergen Reactions   Pork-Derived Products Other (See Comments)    Patient does not eat pork or eggs   Ultram [Tramadol Hcl] Itching    "Tunnel vision" warm feeling all over and wanted to jump out of his skin    ROS Review of Systems  Constitutional:  Positive for fatigue. Negative for chills and fever.  HENT:  Negative for congestion, sinus pressure and sinus pain.   Eyes:  Negative for pain and discharge.  Respiratory:  Negative for cough and shortness of breath.   Cardiovascular:  Negative for chest pain and palpitations.  Gastrointestinal:  Negative for diarrhea, nausea and vomiting.  Genitourinary:  Negative for dysuria and hematuria.  Musculoskeletal:  Positive for arthralgias (B/l foot). Negative for neck pain and neck stiffness.  Skin:  Negative for rash.  Neurological:  Negative for weakness and numbness.  Psychiatric/Behavioral:  Negative for agitation and behavioral problems.       Objective:    Physical Exam Vitals reviewed.  Constitutional:      General: He is not in acute distress.    Appearance: He is not diaphoretic.  HENT:     Head: Normocephalic and atraumatic.     Nose: Nose normal.     Mouth/Throat:     Mouth: Mucous membranes are moist.  Eyes:     General: No scleral icterus.    Extraocular Movements: Extraocular movements intact.  Cardiovascular:     Rate and Rhythm: Normal rate and regular rhythm.     Pulses: Normal pulses.     Heart sounds: Normal heart sounds. No murmur heard. Pulmonary:     Breath sounds: Normal breath sounds. No  wheezing or rales.  Genitourinary:    Penis: Normal.   Musculoskeletal:     Cervical back: Neck supple. No tenderness.     Right lower leg: No edema.     Left lower leg: No edema.  Skin:    General: Skin is warm.     Findings: No rash.  Neurological:     General: No focal deficit present.     Mental Status: He is alert and oriented to person, place, and time.     Sensory: No sensory deficit.     Motor: No weakness.  Psychiatric:        Mood and Affect: Mood normal.        Behavior: Behavior normal.     BP 138/88 (BP Location: Left Arm)   Pulse 87   Ht 6\' 2"  (1.88 m)   Wt 197 lb 9.6 oz (89.6 kg)   SpO2 97%   BMI 25.37 kg/m  Wt Readings from Last 3 Encounters:  05/17/23 197 lb 9.6 oz (89.6 kg)  12/22/22 197 lb 6.4 oz (89.5 kg)  11/09/22 192 lb 0.6 oz (87.1 kg)    Lab Results  Component Value Date   TSH 1.540 02/26/2021   Lab Results  Component Value Date   WBC 7.9 11/07/2022   HGB 14.6 11/07/2022   HCT 42.5 11/07/2022   MCV 96.2 11/07/2022   PLT 148 (L) 11/07/2022   Lab Results  Component Value Date   NA 135 11/07/2022   K 4.1 11/07/2022   CO2 23 11/07/2022   GLUCOSE 103 (H) 11/07/2022   BUN 18 11/07/2022   CREATININE 0.72 11/07/2022   BILITOT 0.4 10/27/2022   ALKPHOS 77 10/27/2022   AST 28 10/27/2022   ALT 49 (H) 10/27/2022   PROT 6.8 10/27/2022   ALBUMIN 4.6 10/27/2022   CALCIUM 8.6 (L) 11/07/2022   ANIONGAP 6 11/07/2022   EGFR 107 10/27/2022   Lab Results  Component Value Date   CHOL 274 (H) 10/27/2022   Lab Results  Component Value Date   HDL 45 10/27/2022   Lab Results  Component Value Date   LDLCALC Comment (A) 10/27/2022   Lab Results  Component Value Date   TRIG 805 (HH) 10/27/2022   Lab Results  Component Value Date   CHOLHDL 6.1 (H) 10/27/2022   Lab Results  Component Value Date   HGBA1C 5.3 02/26/2021      Assessment & Plan:   Problem List Items Addressed This Visit       Cardiovascular and Mediastinum    Essential hypertension   BP Readings from Last 1 Encounters:  05/17/23 138/88   Well-controlled with Metoprolol and Losartan 50 mg once daily at home Home BP around 130s/80s Counseled for compliance with the medications Advised DASH diet and moderate exercise/walking, at least 150 mins/week      Relevant Orders   TSH   CMP14+EGFR   CBC with Differential/Platelet   Paroxysmal atrial fibrillation (HCC)   Rate controlled with metoprolol On Xarelto for [facility] Followed by Cardiology        Musculoskeletal and Integument   Chronic gout of foot  Takes allopurinol 100 mg daily        Other   Vitamin D deficiency   Relevant Orders   VITAMIN D 25 Hydroxy (Vit-D Deficiency, Fractures)   Testosterone deficiency - Primary   Reports mild fatigue Checked total and free testosterone - low on 2 separate blood tests Discussed  testosterone replacement therapy -prescribed AndroGel for better safety profile and easier access Would avoid testosterone injections for now - if testosterone level still low, would refer to Urology for testosterone injectable treatment      Relevant Orders   CBC with Differential/Platelet   Testosterone,Free and Total   PSA   Other Visit Diagnoses       Colon cancer screening       Relevant Orders   Ambulatory referral to Gastroenterology     Encounter for monitoring testosterone replacement therapy       Relevant Orders   CBC with Differential/Platelet   Testosterone,Free and Total     Hyperglycemia       Relevant Orders   Hemoglobin A1c     B12 deficiency       Relevant Orders   B12       No orders of the defined types were placed in this encounter.   Follow-up: No follow-ups on file.    Anabel Halon, MD

## 2023-05-19 ENCOUNTER — Encounter (INDEPENDENT_AMBULATORY_CARE_PROVIDER_SITE_OTHER): Payer: Self-pay | Admitting: *Deleted

## 2023-05-19 LAB — SPECIMEN STATUS REPORT

## 2023-05-19 LAB — VITAMIN B12: Vitamin B-12: 325 pg/mL (ref 232–1245)

## 2023-05-20 ENCOUNTER — Other Ambulatory Visit: Payer: Self-pay | Admitting: Internal Medicine

## 2023-05-20 DIAGNOSIS — E349 Endocrine disorder, unspecified: Secondary | ICD-10-CM

## 2023-05-20 LAB — TSH: TSH: 1.34 u[IU]/mL (ref 0.450–4.500)

## 2023-05-20 LAB — CBC WITH DIFFERENTIAL/PLATELET
Basophils Absolute: 0 10*3/uL (ref 0.0–0.2)
Basos: 1 %
EOS (ABSOLUTE): 0.1 10*3/uL (ref 0.0–0.4)
Eos: 2 %
Hematocrit: 46.4 % (ref 37.5–51.0)
Hemoglobin: 15.8 g/dL (ref 13.0–17.7)
Immature Grans (Abs): 0 10*3/uL (ref 0.0–0.1)
Immature Granulocytes: 0 %
Lymphocytes Absolute: 1.2 10*3/uL (ref 0.7–3.1)
Lymphs: 25 %
MCH: 33.1 pg — ABNORMAL HIGH (ref 26.6–33.0)
MCHC: 34.1 g/dL (ref 31.5–35.7)
MCV: 97 fL (ref 79–97)
Monocytes Absolute: 0.5 10*3/uL (ref 0.1–0.9)
Monocytes: 11 %
Neutrophils Absolute: 2.9 10*3/uL (ref 1.4–7.0)
Neutrophils: 61 %
Platelets: 174 10*3/uL (ref 150–450)
RBC: 4.78 x10E6/uL (ref 4.14–5.80)
RDW: 12.3 % (ref 11.6–15.4)
WBC: 4.8 10*3/uL (ref 3.4–10.8)

## 2023-05-20 LAB — CMP14+EGFR
ALT: 44 [IU]/L (ref 0–44)
AST: 37 [IU]/L (ref 0–40)
Albumin: 4.4 g/dL (ref 3.8–4.9)
Alkaline Phosphatase: 76 [IU]/L (ref 44–121)
BUN/Creatinine Ratio: 19 (ref 9–20)
BUN: 14 mg/dL (ref 6–24)
Bilirubin Total: 0.4 mg/dL (ref 0.0–1.2)
CO2: 23 mmol/L (ref 20–29)
Calcium: 9.3 mg/dL (ref 8.7–10.2)
Chloride: 106 mmol/L (ref 96–106)
Creatinine, Ser: 0.72 mg/dL — ABNORMAL LOW (ref 0.76–1.27)
Globulin, Total: 2.2 g/dL (ref 1.5–4.5)
Glucose: 91 mg/dL (ref 70–99)
Potassium: 4.6 mmol/L (ref 3.5–5.2)
Sodium: 144 mmol/L (ref 134–144)
Total Protein: 6.6 g/dL (ref 6.0–8.5)
eGFR: 107 mL/min/{1.73_m2} (ref >59)

## 2023-05-20 LAB — HEMOGLOBIN A1C
Est. average glucose Bld gHb Est-mCnc: 111 mg/dL
Hgb A1c MFr Bld: 5.5 % (ref 4.8–5.6)

## 2023-05-20 LAB — VITAMIN D 25 HYDROXY (VIT D DEFICIENCY, FRACTURES): Vit D, 25-Hydroxy: 26.1 ng/mL — ABNORMAL LOW (ref 30.0–100.0)

## 2023-05-20 LAB — PSA: Prostate Specific Ag, Serum: 0.8 ng/mL (ref 0.0–4.0)

## 2023-05-20 LAB — TESTOSTERONE,FREE AND TOTAL
Testosterone, Free: 4.8 pg/mL — ABNORMAL LOW (ref 7.2–24.0)
Testosterone: 201 ng/dL — ABNORMAL LOW (ref 264–916)

## 2023-05-20 MED ORDER — TESTOSTERONE 20.25 MG/ACT (1.62%) TD GEL
TRANSDERMAL | 2 refills | Status: DC
Start: 2023-05-20 — End: 2023-08-25

## 2023-05-27 ENCOUNTER — Ambulatory Visit: Payer: BC Managed Care – PPO | Attending: Cardiology | Admitting: Cardiology

## 2023-05-27 ENCOUNTER — Encounter: Payer: Self-pay | Admitting: Cardiology

## 2023-05-27 VITALS — BP 150/90 | HR 68 | Ht 74.0 in | Wt 198.2 lb

## 2023-05-27 DIAGNOSIS — E782 Mixed hyperlipidemia: Secondary | ICD-10-CM

## 2023-05-27 DIAGNOSIS — Z79899 Other long term (current) drug therapy: Secondary | ICD-10-CM | POA: Diagnosis not present

## 2023-05-27 DIAGNOSIS — Z953 Presence of xenogenic heart valve: Secondary | ICD-10-CM

## 2023-05-27 DIAGNOSIS — I10 Essential (primary) hypertension: Secondary | ICD-10-CM | POA: Diagnosis not present

## 2023-05-27 DIAGNOSIS — Z8679 Personal history of other diseases of the circulatory system: Secondary | ICD-10-CM

## 2023-05-27 DIAGNOSIS — E785 Hyperlipidemia, unspecified: Secondary | ICD-10-CM

## 2023-05-27 MED ORDER — SPIRONOLACTONE 25 MG PO TABS: 12.5000 mg | ORAL_TABLET | Freq: Every day | ORAL | 3 refills | Status: DC

## 2023-05-27 NOTE — Patient Instructions (Signed)
Medication Instructions:  Your physician has recommended you make the following change in your medication:  Start taking Spironolactone 12.5 mg once daily Continue taking all other medications as prescribed  Labwork: BMET and Fasting Lipids to be completed in 2 weeks at [facility] in [place]  Testing/Procedures: None  Follow-Up: Your physician recommends that you schedule a follow-up appointment in: 6 months  Any Other Special Instructions Will Be Listed Below (If Applicable). Thank you for choosing [facility]!     If you need a refill on your cardiac medications before your next appointment, please call your pharmacy.

## 2023-05-27 NOTE — Progress Notes (Signed)
    Cardiology Office Note  Date: 05/27/2023   ID: Donald [name], DOB 09-23-1965, MRN [id number]  History of Present Illness: Donald [name] is a 58 y.o. male last seen in July 2024.  He is here for a follow-up visit.  Reports no exertional chest pain or increasing dyspnea on exertion.  No progressive palpitations, no dizziness or syncope.  I reviewed his medications.  Current regimen includes Lipitor, Cozaar, Toprol XL, Lovaza, and Xarelto.  We discussed getting follow-up lipids to reassess treatment, he had significant hypertriglyceridemia at last check, LDL was not calculated.  Blood pressure elevated today.  States that systolics are 120s to 130s at home with diastolics in the 80s to 90s.  We discussed addition of Aldactone to current regimen.  Physical Exam: VS:  BP (!) 158/88 Comment: (L) arm  Pulse 68   Ht 6\' 2"  (1.88 m)   Wt 198 lb 3.2 oz (89.9 kg)   SpO2 97%   BMI 25.45 kg/m , BMI Body mass index is 25.45 kg/m.  Wt Readings from Last 3 Encounters:  05/27/23 198 lb 3.2 oz (89.9 kg)  05/17/23 197 lb 9.6 oz (89.6 kg)  12/22/22 197 lb 6.4 oz (89.5 kg)    General: Patient appears comfortable at rest. HEENT: Conjunctiva and lids normal. Neck: Supple, no elevated JVP or carotid bruits. Lungs: Clear to auscultation, nonlabored breathing at rest. Cardiac: Regular rate and rhythm, no S3, 2/6 systolic murmur, no pericardial rub. Extremities: No pitting edema.  ECG:  An ECG dated 10/26/2022 was personally reviewed today and demonstrated:  Sinus rhythm with PAC, right bundle branch block, increased voltage.  Labwork: 11/07/2022: Magnesium 2.0 05/17/2023: ALT 44; AST 37; BUN 14; Creatinine, Ser 0.72; Hemoglobin 15.8; Platelets 174; Potassium 4.6; Sodium 144; TSH 1.340     Component Value Date/Time   CHOL 274 (H) 10/27/2022 0838   TRIG 805 (HH) 10/27/2022 0838   HDL 45 10/27/2022 0838   CHOLHDL 6.1 (H) 10/27/2022 0838   CHOLHDL 3.3 06/28/2019 0833   LDLCALC Comment (A) 10/27/2022  0838   LDLCALC 76 06/28/2019 0102   Other Studies Reviewed Today:  No interval cardiac testing for review today.  Assessment and Plan:  1.  History of severe aortic regurgitation status post AVR in April 2021, 29 mm Edwards Inspiris Resilia prosthesis.  Echocardiogram in April 2024 revealed no significant aortic regurgitation and a mean AV gradient of approximately 9 mmHg.  He remains clinically stable, no change in murmur.   2.  HFrecEF with history of nonischemic cardiomyopathy, LVEF 55 to 60% range by echocardiogram in April 2024.  Continue Toprol-XL and Cozaar.  Also adding Aldactone for concurrent treatment of hypertension.   3.  Paroxysmal atrial fibrillation.  CHA2DS2-VASc score is 2-3.  Reports no progressive palpitations.  He remains on Xarelto for stroke prophylaxis.   4.  Mixed hyperlipidemia.  Recheck FLP on current doses of Lipitor and Lovaza.   5.  Primary hypertension.  Add Aldactone 12.5 mg to current regimen.  Check BMET in 2 weeks.  Disposition:  Follow up  6 months.  Signed, Jonelle Sidle, M.D., F.A.C.C. [facility] at [facility]

## 2023-07-08 ENCOUNTER — Ambulatory Visit: Payer: Self-pay | Admitting: Internal Medicine

## 2023-07-18 ENCOUNTER — Ambulatory Visit
Admission: RE | Admit: 2023-07-18 | Discharge: 2023-07-18 | Disposition: A | Discharge status: Home / Self Care | Source: Ambulatory Visit | Attending: Nurse Practitioner | Admitting: Nurse Practitioner

## 2023-07-18 VITALS — BP 157/89 | HR 87 | Temp 98.5°F | Resp 16

## 2023-07-18 DIAGNOSIS — M25562 Pain in left knee: Secondary | ICD-10-CM | POA: Diagnosis not present

## 2023-07-18 DIAGNOSIS — S86912A Strain of unspecified muscle(s) and tendon(s) at lower leg level, left leg, initial encounter: Secondary | ICD-10-CM | POA: Diagnosis not present

## 2023-07-18 NOTE — ED Triage Notes (Signed)
 Left knee pain x 3 weeks. No recent falls or injuries. Pt states it is not tender to touch but has a sharp burning pain when trying to walk.

## 2023-07-18 NOTE — Discharge Instructions (Signed)
 Recommend start Tylenol [phone] mg every 6 hours as needed for pain.  Also recommend wearing the knee brace to help with pain and knee strain.  You can apply ice 15 minutes on 45 minutes off every hour while awake.  Follow up with Orthopedist if symptoms do not improve with treatment.

## 2023-07-18 NOTE — ED Provider Notes (Signed)
 [facility]    CSN: [id number] Arrival date & time: 07/18/23  1129      History   Chief Complaint Chief Complaint  Patient presents with   Knee Pain    Knee and hip pain - Entered by patient    HPI Donald [name] is a 58 y.o. male.   Patient presents today with 3-week history of left knee pain.  Denies recent fall, trauma, or known injury to the left knee.  Reports he walks on concrete a lot and gets in and out of a truck and thinks he may have injured his knee somehow.  He denies swelling, bruising, or redness.  Reports only has pain when he walks on the outside of his knee and he describes the pain as a burning.  No shooting pain down the ankle or numbness/tingling in the toes.  No noises coming from the knee, however does endorse some weakness with weightbearing and occasional sensation of giving way with ambulation.  Denies history of knee surgeries.  Has not taken anything or tried anything for symptoms so far.    Past Medical History:  Diagnosis Date   Acute gout of left foot 10/03/2019   Alcohol use 06/27/2019   Bone tumor 11/22/2013   Cardiomyopathy (HCC)    a. EF 30 to 35% by echo in 06/2019 in the setting of severe AI   COVID-19 04/16/2020   Encounter for screening for malignant neoplasm of colon 06/27/2019   Glenoid labral tear 10/27/2010   History of gout 06/27/2019   Hyperlipidemia    Mass of soft tissue of foot    Patent foramen ovale    Rotator cuff tear, right 10/27/2010   S/P aortic valve replacement with bioprosthetic valve 08/09/2019   29 mm Edwards Inspiris Resilia stented bovine pericardial tissue valve   S/P patent foramen ovale closure 08/09/2019   Severe aortic insufficiency     Patient Active Problem List   Diagnosis Date Noted   Enlarged prostate 11/10/2022   Multiple closed fractures of ribs of left side 11/07/2022   Traumatic pneumothorax 11/06/2022   Chronic pain of both ankles 03/09/2022   RUQ abdominal mass 03/03/2022    Pain of right heel 03/03/2022   Testosterone deficiency 03/03/2022   Paroxysmal atrial fibrillation (HCC) 08/26/2021   Nonischemic cardiomyopathy (HCC) 08/26/2021   Acute idiopathic gout of multiple sites 08/26/2021   Encounter for general adult medical examination with abnormal findings 02/26/2021   DDD (degenerative disc disease), cervical 02/26/2021   Screening for prostate cancer 02/26/2021   Suprapatellar bursitis of right knee 02/26/2021   Nicotine abuse 02/20/2020   Chronic gout of foot 10/03/2019   S/P aortic valve replacement with bioprosthetic valve 08/09/2019   S/P patent foramen ovale closure 08/09/2019   S/P AVR (aortic valve replacement) 08/09/2019   Hyperlipidemia 06/27/2019   Overweight (BMI 25.0-29.9) 06/27/2019   Essential hypertension 06/27/2019   Vitamin D deficiency 06/27/2019   RBBB (right bundle branch block) 06/27/2019   LVH (left ventricular hypertrophy) 06/27/2019    Past Surgical History:  Procedure Laterality Date   AORTIC VALVE REPLACEMENT N/A 08/09/2019   Procedure: AORTIC VALVE REPLACEMENT (AVR) using Cyndee Brightly Resilia 29 MM Aortic Valve.;  Surgeon: Purcell Nails, MD;  Location: [facility] OR;  Service: Open Heart Surgery;  Laterality: N/A;   BACK SURGERY     multiple back surgery   Benign tumor     Left shin   BUBBLE STUDY  07/05/2019   Procedure: BUBBLE STUDY;  Surgeon: Jonelle Sidle, MD;  Location: [facility] ORS;  Service: Cardiovascular;;   CARDIAC VALVE REPLACEMENT N/A    Phreesia 02/19/2020   HERNIA REPAIR     REPAIR OF PATENT FORAMEN OVALE N/A 08/09/2019   Procedure: Closure Of Patent Foramen Ovale;  Surgeon: Purcell Nails, MD;  Location: [facility] OR;  Service: Open Heart Surgery;  Laterality: N/A;   RIGHT/LEFT HEART CATH AND CORONARY ANGIOGRAPHY N/A 07/26/2019   Procedure: RIGHT/LEFT HEART CATH AND CORONARY ANGIOGRAPHY;  Surgeon: Kathleene Hazel, MD;  Location: [facility] INVASIVE CV LAB;  Service: Cardiovascular;  Laterality: N/A;   SHOULDER  SURGERY Left    SHOULDER SURGERY Right    TEE WITHOUT CARDIOVERSION N/A 07/05/2019   Procedure: TRANSESOPHAGEAL ECHOCARDIOGRAM (TEE) WITH PROPOFOL;  Surgeon: Jonelle Sidle, MD;  Location: [facility];  Service: Cardiovascular;  Laterality: N/A;   TEE WITHOUT CARDIOVERSION N/A 08/09/2019   Procedure: TRANSESOPHAGEAL ECHOCARDIOGRAM (TEE);  Surgeon: Purcell Nails, MD;  Location: [facility] OR;  Service: Open Heart Surgery;  Laterality: N/A;   Thumb surgery Left x3   VASECTOMY N/A    Phreesia 02/19/2020       Home Medications    Prior to Admission medications   Medication Sig Start Date End Date Taking? Authorizing Provider  allopurinol (ZYLOPRIM) 100 MG tablet TAKE 1 TABLET BY MOUTH EVERY DAY 06/14/22  Yes Anabel Halon, MD  atorvastatin (LIPITOR) 40 MG tablet Take 1 tablet (40 mg total) by mouth daily. 11/03/22  Yes Jonelle Sidle, MD  Cholecalciferol (VITAMIN D3) 50 MCG (2000 UT) TABS Take 2,000 Units by mouth daily.   Yes [provider]  Coenzyme Q10 (COQ10) 100 MG CAPS Take 100 mg by mouth daily.   Yes [provider]  Glucosamine-Chondroitin (GLUCOSAMINE CHONDR COMPLEX PO) Take 2 tablets by mouth every morning.   Yes [provider]  losartan (COZAAR) 50 MG tablet Take 1 tablet (50 mg total) by mouth daily. 11/03/22  Yes Anabel Halon, MD  metoprolol succinate (TOPROL-XL) 50 MG 24 hr tablet Take 1 tablet (50 mg total) by mouth daily. Take with or immediately following a meal. 12/30/22  Yes Jonelle Sidle, MD  Multiple Vitamin (MULTIVITAMIN WITH MINERALS) TABS tablet Take 1 tablet by mouth daily.   Yes [provider]  omega-3 acid ethyl esters (LOVAZA) 1 g capsule TAKE 2,000 MG BY MOUTH 2 (TWO) TIMES DAILY. 03/16/23  Yes Jonelle Sidle, MD  Testosterone 20.25 MG/ACT (1.62%) GEL PLACE 4 PUMPS OVER THE SHOULDER AREA ONCE DAILY 05/20/23  Yes Anabel Halon, MD  XARELTO 20 MG TABS tablet TAKE 1 TABLET BY MOUTH DAILY WITH SUPPER 05/10/23  Yes Jonelle Sidle, MD  spironolactone (ALDACTONE) 25 MG tablet Take 0.5 tablets (12.5 mg total) by mouth daily. 05/27/23   Jonelle Sidle, MD    Family History Family History  Problem Relation Age of Onset   Other Father        Kidney    Social History Social History   Tobacco Use   Smoking status: Every Day    Current packs/day: 0.00    Types: Cigarettes    Last attempt to quit: 08/02/2019    Years since quitting: 3.9   Smokeless tobacco: Never   Tobacco comments:    Smoking 9/10 ciggs per day   Vaping Use   Vaping status: Never Used  Substance Use Topics   Alcohol use: Yes    Comment: "a little wine"   Drug use: No  Allergies   Pork-derived products and Ultram [tramadol hcl]   Review of Systems Review of Systems Per HPI  Physical Exam Triage Vital Signs ED Triage Vitals  Encounter Vitals Group     BP 07/18/23 1144 (!) 157/89     Systolic BP Percentile --      Diastolic BP Percentile --      Pulse Rate 07/18/23 1144 87     Resp 07/18/23 1144 16     Temp 07/18/23 1144 98.5 F (36.9 C)     Temp Source 07/18/23 1144 Oral     SpO2 07/18/23 1144 97 %     Weight --      Height --      Head Circumference --      Peak Flow --      Pain Score 07/18/23 1147 7     Pain Loc --      Pain Education --      Exclude from Growth Chart --    No data found.  Updated Vital Signs BP (!) 157/89 (BP Location: Right Arm)   Pulse 87   Temp 98.5 F (36.9 C) (Oral)   Resp 16   SpO2 97%   Visual Acuity Right Eye Distance:   Left Eye Distance:   Bilateral Distance:    Right Eye Near:   Left Eye Near:    Bilateral Near:     Physical Exam Vitals and nursing note reviewed.  Constitutional:      General: He is not in acute distress.    Appearance: Normal appearance. He is not toxic-appearing.  Pulmonary:     Effort: Pulmonary effort is normal. No respiratory distress.  Musculoskeletal:     Comments: Inspection: no swelling, bruising, obvious deformity or redness to  left knee Palpation: Left knee is diffusely nontender to palpation; no obvious deformities palpated ROM: Full ROM to left knee without pain; no laxity appreciated Strength: 5/5 bilateral lower extremities Neurovascular: neurovascularly intact in bilateral lower extremities   Skin:    General: Skin is warm and dry.     Capillary Refill: Capillary refill takes less than 2 seconds.     Coloration: Skin is not jaundiced or pale.     Findings: No erythema.  Neurological:     Mental Status: He is alert and oriented to person, place, and time.  Psychiatric:        Behavior: Behavior is cooperative.      UC Treatments / Results  Labs (all labs ordered are listed, but only abnormal results are displayed) Labs Reviewed - No data to display  EKG   Radiology No results found.  Procedures Procedures (including critical care time)  Medications Ordered in UC Medications - No data to display  Initial Impression / Assessment and Plan / UC Course  I have reviewed the triage vital signs and the nursing notes.  Pertinent labs & imaging results that were available during my care of the patient were reviewed by me and considered in my medical decision making (see chart for details).   Patient is well-appearing, normotensive, afebrile, not tachycardic, not tachypneic, oxygenating well on room air.    1. Knee strain, left, initial encounter No red flags Vitals and exam are reassuring today Suspect knee strain; brace given recommend light range of motion/stretching exercises and ice/Tylenol as needed for pain Recommended follow-up with Ortho if symptoms do not improve with treatment and contact information provided today Work excuse given  The patient was given the opportunity to  ask questions.  All questions answered to their satisfaction.  The patient is in agreement to this plan.    Final Clinical Impressions(s) / UC Diagnoses   Final diagnoses:  Knee strain, left, initial encounter      Discharge Instructions      Recommend start Tylenol [phone] mg every 6 hours as needed for pain.  Also recommend wearing the knee brace to help with pain and knee strain.  You can apply ice 15 minutes on 45 minutes off every hour while awake.  Follow up with Orthopedist if symptoms do not improve with treatment.   ED Prescriptions   None    PDMP not reviewed this encounter.   Valentino Nose, NP 07/18/23 1310

## 2023-08-08 ENCOUNTER — Other Ambulatory Visit: Payer: Self-pay | Admitting: Internal Medicine

## 2023-08-08 DIAGNOSIS — I10 Essential (primary) hypertension: Secondary | ICD-10-CM

## 2023-08-09 ENCOUNTER — Other Ambulatory Visit: Payer: Self-pay | Admitting: Internal Medicine

## 2023-08-09 DIAGNOSIS — M109 Gout, unspecified: Secondary | ICD-10-CM

## 2023-08-25 ENCOUNTER — Other Ambulatory Visit: Payer: Self-pay | Admitting: Internal Medicine

## 2023-08-25 DIAGNOSIS — E349 Endocrine disorder, unspecified: Secondary | ICD-10-CM

## 2023-11-03 ENCOUNTER — Other Ambulatory Visit: Payer: Self-pay | Admitting: Nurse Practitioner

## 2023-11-03 ENCOUNTER — Ambulatory Visit: Attending: Nurse Practitioner | Admitting: Nurse Practitioner

## 2023-11-03 ENCOUNTER — Other Ambulatory Visit: Payer: Self-pay | Admitting: Internal Medicine

## 2023-11-03 ENCOUNTER — Ambulatory Visit: Attending: Nurse Practitioner

## 2023-11-03 ENCOUNTER — Encounter: Payer: Self-pay | Admitting: Nurse Practitioner

## 2023-11-03 ENCOUNTER — Telehealth: Payer: Self-pay | Admitting: Nurse Practitioner

## 2023-11-03 VITALS — BP 142/90 | HR 72 | Ht 74.0 in | Wt 199.6 lb

## 2023-11-03 DIAGNOSIS — I5032 Chronic diastolic (congestive) heart failure: Secondary | ICD-10-CM

## 2023-11-03 DIAGNOSIS — Z87891 Personal history of nicotine dependence: Secondary | ICD-10-CM

## 2023-11-03 DIAGNOSIS — Z953 Presence of xenogenic heart valve: Secondary | ICD-10-CM

## 2023-11-03 DIAGNOSIS — H538 Other visual disturbances: Secondary | ICD-10-CM

## 2023-11-03 DIAGNOSIS — R42 Dizziness and giddiness: Secondary | ICD-10-CM

## 2023-11-03 DIAGNOSIS — I480 Paroxysmal atrial fibrillation: Secondary | ICD-10-CM | POA: Diagnosis not present

## 2023-11-03 DIAGNOSIS — E782 Mixed hyperlipidemia: Secondary | ICD-10-CM

## 2023-11-03 DIAGNOSIS — I10 Essential (primary) hypertension: Secondary | ICD-10-CM

## 2023-11-03 DIAGNOSIS — F1010 Alcohol abuse, uncomplicated: Secondary | ICD-10-CM | POA: Diagnosis not present

## 2023-11-03 DIAGNOSIS — E349 Endocrine disorder, unspecified: Secondary | ICD-10-CM

## 2023-11-03 MED ORDER — NICOTINE 21 MG/24HR TD PT24: 21.0000 mg | MEDICATED_PATCH | Freq: Every day | TRANSDERMAL | 0 refills | Status: DC

## 2023-11-03 NOTE — Telephone Encounter (Signed)
 Checking percert on the following    LONG TERM MONITOR XT (3-14 DAYS)

## 2023-11-03 NOTE — Patient Instructions (Addendum)
 Medication Instructions:  Your physician has recommended you make the following change in your medication:  Please begin   Labwork: none  Testing/Procedures: ZIO- Long Term Monitor Instructions   Your physician has requested you wear your ZIO patch monitor 14 days.   This is a single patch monitor.  [employer] supplies one patch monitor per enrollment.  Additional stickers are not available.   Please do not apply patch if you will be having a Nuclear Stress Test, Echocardiogram, Cardiac CT, MRI, or Chest Xray during the time frame you would be wearing the monitor. The patch cannot be worn during these tests.  You cannot remove and re-apply the ZIO XT patch monitor.   Your ZIO patch monitor will be sent USPS Priority mail from [facility] directly to your home address. The monitor may also be mailed to a PO BOX if home delivery is not available.   It may take 3-5 days to receive your monitor after you have been enrolled.   Once you have received you monitor, please review enclosed instructions.  Your monitor has already been registered assigning a specific monitor serial # to you.   Applying the monitor   Shave hair from upper left chest.   Hold abrader disc by orange tab.  Rub abrader in 40 strokes over left upper chest as indicated in your monitor instructions.   Clean area with 4 enclosed alcohol pads .  Use all pads to assure are is cleaned thoroughly.  Let dry.   Apply patch as indicated in monitor instructions.  Patch will be place under collarbone on left side of chest with arrow pointing upward.   Rub patch adhesive wings for 2 minutes.Remove white label marked 1.  Remove white label marked 2.  Rub patch adhesive wings for 2 additional minutes.   While looking in a mirror, press and release button in center of patch.  A small green light will flash 3-4 times .  This will be your only indicator the monitor has been turned on.     Do not shower for the first 24  hours.  You may shower after the first 24 hours.   Press button if you feel a symptom. You will hear a small click.  Record Date, Time and Symptom in the Patient Log Book.   When you are ready to remove patch, follow instructions on last 2 pages of Patient Log Book.  Stick patch monitor onto last page of Patient Log Book.   Place Patient Log Book in [place] box.  Use locking tab on box and tape box closed securely.  The [employer] and [employer] has [employer] on it.  Please place in mailbox as soon as possible.  Your physician should have your test results approximately 7 days after the monitor has been mailed back to [facility].   Call [facility] Customer Care at [phone] if you have questions regarding your ZIO XT patch monitor.  Call them immediately if you see an orange light blinking on your monitor.   If your monitor falls off in less than 4 days contact our Monitor department at [phone].  If your monitor becomes loose or falls off after 4 days call [employer] at [phone] for suggestions on securing your monitor. Your physician has requested that you have an echocardiogram. Echocardiography is a painless test that uses sound waves to create images of your heart. It provides your doctor with information about the size and shape of your heart and how well your  heart's chambers and valves are working. This procedure takes approximately one hour. There are no restrictions for this procedure. Please do NOT wear cologne, perfume, aftershave, or lotions (deodorant is allowed). Please arrive 15 minutes prior to your appointment time.  Please note: We ask at that you not bring children with you during ultrasound (echo/ vascular) testing. Due to room size and safety concerns, children are not allowed in the ultrasound rooms during exams. Our front office staff cannot provide observation of children in our lobby area while testing is being conducted. An adult accompanying a patient to  their appointment will only be allowed in the ultrasound room at the discretion of the ultrasound technician under special circumstances. We apologize for any inconvenience.  Follow-Up: Your physician recommends that you schedule a follow-up appointment in: 8 weeks   Any Other Special Instructions Will Be Listed Below (If Applicable).  If you need a refill on your cardiac medications before your next appointment, please call your pharmacy.

## 2023-11-03 NOTE — Progress Notes (Signed)
 Cardiology Office Note   Date:  11/03/2023  ID:  Donald [name], DOB 1965/09/11, MRN [id number] PCP: [name], MD  [facility] Providers Cardiologist:  Jayson Sierras, MD     History of Present Illness Donald [name] is a 58 y.o. male with a PMH of severe AR, s/p AVR in 2021, HFrecEF, HTN, PAF, mixed HLD, and dizziness, who presents today for scheduled follow-up.   Last seen by Dr. Sierras on May 27, 2023. Was doing well at the time.   Recent ED visit at [facility] on 10/30/2023. He reported dizziness while driving from [place]  to [place]. Also reported poor appetite, was wondering about hypoglycemia. Had blurry vision with episode that sounded like vertigo, episode lasted 5-10 seconds. Head imaging was negative.   He requests for sooner follow-up to be evaluated. He tells me that surrounding the episode he had a double espresso and a coffee, 2 hours into trip was feeling jittery, then began to feel a slight pressure and brief symptoms, says the onset of the symptoms was probably less than 5-10 seconds. Had three more of these spells during his ED visit. Ever since leaving the ED, he feels off, hard for him to explain this further. Denies any chest pain, shortness of breath, palpitations, syncope, presyncope, dizziness, orthopnea, PND, swelling or significant weight changes, acute bleeding, or claudication. Wife does admit to patient experiencing recent stress.   ROS: Negative. See HPI.   SH: Just stopped smoking cigarettes today. Currently drinks an excessive amount of alcohol per patient's report. Denies any illicit drug use.  Studies Reviewed  EKG:  NSR with RBBB, 68 bpm, LVH with some minimal repolarization abnormality, no acute ST segment changes.   Echo 07/2022:   1. Anteroseptal wall is hypokinetic.      SABRA Left ventricular ejection fraction, by estimation, is 55 to 60%. The  left ventricle has normal function. The left ventricle demonstrates  regional wall motion  abnormalities (see scoring diagram/findings for  description). There is moderate left  ventricular hypertrophy. Left ventricular diastolic parameters are  consistent with Grade I diastolic dysfunction (impaired relaxation).   2. Right ventricular systolic function is normal. The right ventricular  size is normal.   3. Left atrial size was moderately dilated.   4. The mitral valve is normal in structure. No evidence of mitral valve  regurgitation. No evidence of mitral stenosis.   5. 29 mm Edwards Inspiris Resilia stented bovine pericardial tissue valve  is in the AV position.      . The aortic valve has been repaired/replaced. Aortic valve  regurgitation is not visualized. No aortic stenosis is present.   6. The inferior vena cava is normal in size with <50% respiratory  variability, suggesting right atrial pressure of 8 mmHg.   Right/left heart cath 2021:  Prox RCA lesion is 20% stenosed. Dist RCA lesion is 10% stenosed. 3rd Mrg lesion is 10% stenosed. Prox LAD to Mid LAD lesion is 20% stenosed. 1st Diag lesion is 30% stenosed.   1. Mild non-obstructive CAD 2. Severe aortic valve insufficiency.    Recommendations: Continue planning for aortic valve repair.      Physical Exam VS:  BP (!) 142/90   Pulse 72   Ht 6' 2 (1.88 m)   Wt 199 lb 9.6 oz (90.5 kg)   SpO2 98%   BMI 25.63 kg/m   Orthostatic VS for the past 24 hrs (Last 3 readings):  BP- Lying Pulse- Lying BP- Sitting Pulse- Sitting  BP- Standing at 0 minutes Pulse- Standing at 0 minutes BP- Standing at 3 minutes Pulse- Standing at 3 minutes  11/03/23 1003 (!) 165/101 66 (!) 167/111 67 (!) 156/100 71 (!) 162/107 69      Wt Readings from Last 3 Encounters:  11/03/23 199 lb 9.6 oz (90.5 kg)  05/27/23 198 lb 3.2 oz (89.9 kg)  05/17/23 197 lb 9.6 oz (89.6 kg)    GEN: Well nourished, well developed in no acute distress NECK: No JVD; No carotid bruits CARDIAC: S1/S2, RRR, no murmurs, rubs, gallops RESPIRATORY:  Clear to  auscultation without rales, wheezing or rhonchi  ABDOMEN: Soft, non-tender, non-distended EXTREMITIES:  No edema; No deformity   ASSESSMENT AND PLAN  Severe AR, s/p AVR in 2021 Echo 07/2022 showed normal LVEF, normal function of aortic valve replacement. Will update Echo at this time - see below.  No medication changes at this time. Heart healthy diet and regular cardiovascular exercise encouraged.   HFrecEF Stage C, NYHA class I-II symptoms.  Normal LVEF 1 year ago. Euvolemic and well compensated on exam.  Will update echocardiogram at this time.  No medication changes at this time based on his current symptoms. Low sodium diet, fluid restriction <2L, and daily weights encouraged. Educated to contact our office for weight gain of 2 lbs overnight or 5 lbs in one week.  PAF Reports some atypical symptoms.  Will arrange 2-week ZIO XT monitor for further evaluation.  Heart rate is well-controlled today.  Continue Toprol -XL and Xarelto .  He is on appropriate dosage of Xarelto .  Denies any bleeding issues.  HTN Blood pressure elevated-etiology multifactorial. Will continue to monitor at this time  - does admit to stress. Discussed to monitor BP at home at least 2 hours after medications and sitting for 5-10 minutes.  Continue current medication regimen. Heart healthy diet and regular cardiovascular exercise encouraged.   Alcohol abuse Discussed risk of excessive alcohol consumption and encouraged him to wean off alcohol.  He verbalized understanding.  Continue follow-up PCP.  Hx of tobacco abuse He has stopped cold [place] today.  I offered/recommended a nicotine  patch to help with this.  Will arrange Rx for nicotine  patch.  Congratulated him on this step to improving his health.  Blurry vision, dizziness Etiology unclear.  Arranging echocardiogram and monitor as noted above.  Most likely in setting of excessive caffeine intake/stress.  Also notes excessive alcohol intake and discussed risks of  this-see above.   Dispo: Follow-up with me/[facility] in 8 weeks or sooner anything changes.  Signed, Almarie Crate, NP

## 2023-11-05 ENCOUNTER — Other Ambulatory Visit: Payer: Self-pay | Admitting: Cardiology

## 2023-11-07 ENCOUNTER — Other Ambulatory Visit: Payer: Self-pay | Admitting: Internal Medicine

## 2023-11-07 DIAGNOSIS — I10 Essential (primary) hypertension: Secondary | ICD-10-CM | POA: Diagnosis not present

## 2023-11-07 DIAGNOSIS — E349 Endocrine disorder, unspecified: Secondary | ICD-10-CM

## 2023-11-07 DIAGNOSIS — Z87891 Personal history of nicotine dependence: Secondary | ICD-10-CM

## 2023-11-07 DIAGNOSIS — E782 Mixed hyperlipidemia: Secondary | ICD-10-CM | POA: Diagnosis not present

## 2023-11-07 MED ORDER — TESTOSTERONE 20.25 MG/ACT (1.62%) TD GEL: TRANSDERMAL | 2 refills | Status: DC

## 2023-11-08 MED ORDER — NICOTINE 21 MG/24HR TD PT24: 21.0000 mg | MEDICATED_PATCH | Freq: Every day | TRANSDERMAL | 0 refills | Status: AC

## 2023-11-09 ENCOUNTER — Ambulatory Visit: Attending: Nurse Practitioner

## 2023-11-09 DIAGNOSIS — R42 Dizziness and giddiness: Secondary | ICD-10-CM

## 2023-11-09 LAB — ECHOCARDIOGRAM COMPLETE
AR max vel: 1.39 cm2
AV Area VTI: 1.55 cm2
AV Area mean vel: 1.65 cm2
AV Mean grad: 17 mmHg
AV Peak grad: 30.7 mmHg
Ao pk vel: 2.77 m/s
Area-P 1/2: 3.28 cm2
Calc EF: 67 %
MV VTI: 3.43 cm2
S' Lateral: 4 cm
Single Plane A2C EF: 67.7 %
Single Plane A4C EF: 62.5 %

## 2023-11-09 LAB — LIPID PANEL
Chol/HDL Ratio: 3.9 ratio (ref 0.0–5.0)
Cholesterol, Total: 181 mg/dL (ref 100–199)
HDL: 46 mg/dL (ref >39)
LDL Chol Calc (NIH): 66 mg/dL (ref 0–99)
Triglycerides: 447 mg/dL — ABNORMAL HIGH (ref 0–149)
VLDL Cholesterol Cal: 69 mg/dL — ABNORMAL HIGH (ref 5–40)

## 2023-11-09 LAB — CMP14+EGFR
ALT: 36 IU/L (ref 0–44)
AST: 27 IU/L (ref 0–40)
Albumin: 4.4 g/dL (ref 3.8–4.9)
Alkaline Phosphatase: 70 IU/L (ref 44–121)
BUN/Creatinine Ratio: 23 — ABNORMAL HIGH (ref 9–20)
BUN: 16 mg/dL (ref 6–24)
Bilirubin Total: 0.5 mg/dL (ref 0.0–1.2)
CO2: 20 mmol/L (ref 20–29)
Calcium: 9.3 mg/dL (ref 8.7–10.2)
Chloride: 106 mmol/L (ref 96–106)
Creatinine, Ser: 0.69 mg/dL — ABNORMAL LOW (ref 0.76–1.27)
Globulin, Total: 2.3 g/dL (ref 1.5–4.5)
Glucose: 90 mg/dL (ref 70–99)
Potassium: 4.6 mmol/L (ref 3.5–5.2)
Sodium: 142 mmol/L (ref 134–144)
Total Protein: 6.7 g/dL (ref 6.0–8.5)
eGFR: 109 mL/min/1.73 (ref >59)

## 2023-11-09 LAB — CBC WITH DIFFERENTIAL/PLATELET
Basophils Absolute: 0.1 x10E3/uL (ref 0.0–0.2)
Basos: 1 %
EOS (ABSOLUTE): 0.2 x10E3/uL (ref 0.0–0.4)
Eos: 3 %
Hematocrit: 47.3 % (ref 37.5–51.0)
Hemoglobin: 15.8 g/dL (ref 13.0–17.7)
Immature Grans (Abs): 0 x10E3/uL (ref 0.0–0.1)
Immature Granulocytes: 0 %
Lymphocytes Absolute: 1.7 x10E3/uL (ref 0.7–3.1)
Lymphs: 27 %
MCH: 32.5 pg (ref 26.6–33.0)
MCHC: 33.4 g/dL (ref 31.5–35.7)
MCV: 97 fL (ref 79–97)
Monocytes Absolute: 0.7 x10E3/uL (ref 0.1–0.9)
Monocytes: 11 %
Neutrophils Absolute: 3.5 x10E3/uL (ref 1.4–7.0)
Neutrophils: 58 %
Platelets: 166 x10E3/uL (ref 150–450)
RBC: 4.86 x10E6/uL (ref 4.14–5.80)
RDW: 12 % (ref 11.6–15.4)
WBC: 6.2 x10E3/uL (ref 3.4–10.8)

## 2023-11-09 LAB — TESTOSTERONE,FREE AND TOTAL
Testosterone, Free: 6.9 pg/mL — ABNORMAL LOW (ref 7.2–24.0)
Testosterone: 180 ng/dL — ABNORMAL LOW (ref 264–916)

## 2023-11-14 DIAGNOSIS — R42 Dizziness and giddiness: Secondary | ICD-10-CM | POA: Diagnosis not present

## 2023-11-15 ENCOUNTER — Ambulatory Visit: Payer: BC Managed Care – PPO | Admitting: Internal Medicine

## 2023-11-17 ENCOUNTER — Encounter (INDEPENDENT_AMBULATORY_CARE_PROVIDER_SITE_OTHER): Payer: Self-pay | Admitting: *Deleted

## 2023-11-21 ENCOUNTER — Encounter: Payer: Self-pay | Admitting: Internal Medicine

## 2023-11-22 ENCOUNTER — Other Ambulatory Visit

## 2023-12-15 ENCOUNTER — Other Ambulatory Visit: Payer: Self-pay | Admitting: Cardiology

## 2023-12-16 NOTE — Telephone Encounter (Signed)
 Prescription refill request for Xarelto  received.  Indication:afib Last office visit:7/25 Weight:90.5  kg Age:58 Scr:0.69  7/25 CrCl:153.02  ml/min  Prescription refilled

## 2023-12-18 ENCOUNTER — Ambulatory Visit: Payer: Self-pay | Admitting: Nurse Practitioner

## 2024-01-05 ENCOUNTER — Ambulatory Visit: Payer: Self-pay | Admitting: Nurse Practitioner

## 2024-01-09 ENCOUNTER — Ambulatory Visit: Attending: Nurse Practitioner | Admitting: Nurse Practitioner

## 2024-01-09 ENCOUNTER — Encounter: Payer: Self-pay | Admitting: Nurse Practitioner

## 2024-01-09 VITALS — BP 146/100 | HR 87 | Ht 74.0 in | Wt 192.8 lb

## 2024-01-09 DIAGNOSIS — I10 Essential (primary) hypertension: Secondary | ICD-10-CM

## 2024-01-09 DIAGNOSIS — I480 Paroxysmal atrial fibrillation: Secondary | ICD-10-CM | POA: Diagnosis not present

## 2024-01-09 DIAGNOSIS — Z720 Tobacco use: Secondary | ICD-10-CM

## 2024-01-09 DIAGNOSIS — I5032 Chronic diastolic (congestive) heart failure: Secondary | ICD-10-CM | POA: Diagnosis not present

## 2024-01-09 DIAGNOSIS — R200 Anesthesia of skin: Secondary | ICD-10-CM

## 2024-01-09 DIAGNOSIS — Z953 Presence of xenogenic heart valve: Secondary | ICD-10-CM

## 2024-01-09 DIAGNOSIS — I350 Nonrheumatic aortic (valve) stenosis: Secondary | ICD-10-CM

## 2024-01-09 DIAGNOSIS — F1010 Alcohol abuse, uncomplicated: Secondary | ICD-10-CM

## 2024-01-09 NOTE — Progress Notes (Unsigned)
 Cardiology Office Note   Date: 01/09/2024 ID:  Donald [name], DOB 05-Mar-1966, MRN [id number] PCP: [name], MD  [facility] Providers Cardiologist:  Jayson Sierras, MD     History of Present Illness [name] is a 58 y.o. male with a PMH of severe AR, s/p AVR in 2021, HFrecEF, HTN, PAF, mixed HLD, and dizziness, who presents today for scheduled follow-up.   Last seen by Dr. Sierras on May 27, 2023. Was doing well at the time.   Recent ED visit at [facility] on 10/30/2023. He reported dizziness while driving from [place]  to [place]. Also reported poor appetite, was wondering about hypoglycemia. Had blurry vision with episode that sounded like vertigo, episode lasted 5-10 seconds. Head imaging was negative.   11/03/2023 - He requests for sooner follow-up to be evaluated. He tells me that surrounding the episode he had a double espresso and a coffee, 2 hours into trip was feeling jittery, then began to feel a slight pressure and brief symptoms, says the onset of the symptoms was probably less than 5-10 seconds. Had three more of these spells during his ED visit. Ever since leaving the ED, he feels off, hard for him to explain this further. Denies any chest pain, shortness of breath, palpitations, syncope, presyncope, dizziness, orthopnea, PND, swelling or significant weight changes, acute bleeding, or claudication. Wife does admit to patient experiencing recent stress.   01/09/2024 - Here with his wife. Wife admits that patient has increased his alcohol intake and his nicotine  use since last office visit due to anxiety. Pt says he is feeling good, denies any recurrent episodes since last office visit. Denies any chest pain, shortness of breath, palpitations, syncope, presyncope, dizziness, orthopnea, PND, swelling or significant weight changes, acute bleeding, or claudication. Does admit to some atypical numbness.   ROS: Negative. See HPI.   SH: Just stopped smoking cigarettes  today. Currently drinks an excessive amount of alcohol per patient's report. Denies any illicit drug use.  Studies Reviewed  EKG: EKG is not ordered today.  Cardiac monitor 10/2023:  ZIO monitor reviewed.  4 days, 3 hours analyzed.   Predominant rhythm is sinus with intermittent IVCD, heart rate ranging from 63 bpm up to 120 bpm and average heart rate 83 bpm. There were rare PACs and PVCs representing less than 1% total beats. Two runs of NSVT were noted, the longest of which was 7 beats.  There were no sustained ventricular events.   No pauses or high degree heart block.  Echo 10/2023:  1. Left ventricular ejection fraction, by estimation, is 55 to 60%. The  left ventricle has normal function. The left ventricle has no regional  wall motion abnormalities. There is moderate concentric left ventricular  hypertrophy. Left ventricular  diastolic parameters are indeterminate.   2. Right ventricular systolic function is normal. The right ventricular  size is normal. There is normal pulmonary artery systolic pressure. The  estimated right ventricular systolic pressure is 18.2 mmHg.   3. Left atrial size was upper normal.   4. The mitral valve is grossly normal. Trivial mitral valve  regurgitation.   5. The aortic valve has been repaired/replaced. Aortic valve  regurgitation is not visualized. Possible mild to moderate prosthetic  aortic valve stenosis. There is a 29 mm Edwards Inspiris Resilia stented  bovine pericardial tissue valve present in the  aortic position. Aortic valve mean gradient measures 17.0 mmHg.  Dimentionless index 0.32.   6. Aortic dilatation noted. There is moderate  dilatation of the ascending  aorta, measuring 43 mm.   7. The inferior vena cava is normal in size with greater than 50%  respiratory variability, suggesting right atrial pressure of 3 mmHg.   Comparison(s): A prior study was performed on 08/06/2022. Prior images  reviewed side by side. LVEF 55-60%.  Possible mild to moderate prosthetic  aortic stenosis with increased mean gradient of 17 mmHg up from 9 mmHg on  last study, dimentionless index 0.32.  Echo 07/2022:   1. Anteroseptal wall is hypokinetic.      SABRA Left ventricular ejection fraction, by estimation, is 55 to 60%. The  left ventricle has normal function. The left ventricle demonstrates  regional wall motion abnormalities (see scoring diagram/findings for  description). There is moderate left  ventricular hypertrophy. Left ventricular diastolic parameters are  consistent with Grade I diastolic dysfunction (impaired relaxation).   2. Right ventricular systolic function is normal. The right ventricular  size is normal.   3. Left atrial size was moderately dilated.   4. The mitral valve is normal in structure. No evidence of mitral valve  regurgitation. No evidence of mitral stenosis.   5. 29 mm Edwards Inspiris Resilia stented bovine pericardial tissue valve  is in the AV position.      . The aortic valve has been repaired/replaced. Aortic valve  regurgitation is not visualized. No aortic stenosis is present.   6. The inferior vena cava is normal in size with <50% respiratory  variability, suggesting right atrial pressure of 8 mmHg.   Right/left heart cath 2021:  Prox RCA lesion is 20% stenosed. Dist RCA lesion is 10% stenosed. 3rd Mrg lesion is 10% stenosed. Prox LAD to Mid LAD lesion is 20% stenosed. 1st Diag lesion is 30% stenosed.   1. Mild non-obstructive CAD 2. Severe aortic valve insufficiency.    Recommendations: Continue planning for aortic valve repair.      Physical Exam VS:  BP (!) 139/100 (BP Location: Right Arm)   Pulse 87   Ht 6' 2 (1.88 m)   Wt 192 lb 12.8 oz (87.5 kg)   SpO2 96%   BMI 24.75 kg/m    Wt Readings from Last 3 Encounters:  01/09/24 192 lb 12.8 oz (87.5 kg)  11/03/23 199 lb 9.6 oz (90.5 kg)  05/27/23 198 lb 3.2 oz (89.9 kg)    GEN: Well nourished, well developed in no acute  distress, looks nervous NECK: No JVD; No carotid bruits CARDIAC: S1/S2, RRR, no murmurs, rubs, gallops RESPIRATORY:  Clear to auscultation without rales, wheezing or rhonchi  ABDOMEN: Soft, non-tender, non-distended EXTREMITIES:  No edema; No deformity   ASSESSMENT AND PLAN  Severe AR, s/p AVR in 2021, aortic valve stenosis Echo 07/2022 showed normal LVEF, normal function of aortic valve replacement. Most recent Echo showed possible mild to moderate prosthetic aortic valve stenosis with aortic valve mean gradient measuring 17.0 mmHg. Denies any symptoms. No medication changes at this time. Heart healthy diet and regular cardiovascular exercise encouraged.   HFrecEF Stage C, NYHA class I-II symptoms.  Normal LVEF seen on recent Echo - see above. Euvolemic and well compensated on exam.  No medication changes at this time based on his current symptoms. Low sodium diet, fluid restriction <2L, and daily weights encouraged. Educated to contact our office for weight gain of 2 lbs overnight or 5 lbs in one week.  PAF Denies any recent tachycardia or palpitations. Heart rate is well-controlled today.  Continue Toprol -XL and Xarelto .  He is on  appropriate dosage of Xarelto .  Denies any bleeding issues.  HTN Blood pressure elevated-etiology multifactorial. Will continue to monitor at this time  - does admit to stress. Discussed to monitor BP at home at least 2 hours after medications and sitting for 5-10 minutes. Will bring him back for BP check in 2-3 weeks. If no improvement by next office visit, increase Toprol  XL to 75 mg daily. Continue current medication regimen. Heart healthy diet and regular cardiovascular exercise encouraged.   Alcohol abuse, hypertriglyceridemia Discussed risk of excessive alcohol consumption and encouraged him to wean off alcohol.  He verbalized understanding.  Continue follow-up PCP. Most recent triglyceride level was 447 - most likely related to alcohol abuse. Following  Endocrinology and PCP. Will send in Rx for fenofibrate  145 mg daily and check FLP/LFT in 2-3 months.   Tobacco abuse Smoking cessation encouraged and discussed.   7. Numbness Etiology unclear. Does not sound cardiac related. Recommended to f/u with PCP regarding this.    Dispo: Follow-up with me/APP in 3 months or sooner anything changes.  Signed, Almarie Crate, NP

## 2024-01-09 NOTE — Patient Instructions (Addendum)
 Medication Instructions:  Your physician recommends that you continue on your current medications as directed. Please refer to the Current Medication list given to you today.  Labwork: None   Testing/Procedures: None   Follow-Up: Your physician recommends that you schedule a follow-up appointment in:  2-3 week Blood pressure check  3 Months   Any Other Special Instructions Will Be Listed Below (If Applicable).  If you need a refill on your cardiac medications before your next appointment, please call your pharmacy.   (Patient Name)-_____________________________  (MRN)-_________________________     (Physician)-_________________________________   (DATE) -_________ (Blood Pressure)-_________  (Heart Rate)-_________    (DATE) -_________ (Blood Pressure)-_________  (Heart Rate)-_________    (DATE) -_________ (Blood Pressure)-_________  (Heart Rate)-_________   (DATE) -_________ (Blood Pressure)-_________  (Heart Rate)-_________    (DATE) -_________ (Blood Pressure)-_________  (Heart Rate)-_________    (DATE) -_________ (Blood Pressure)-_________  (Heart Rate)-_________   (DATE) -_________ (Blood Pressure)-_________  (Heart Rate)-_________    (DATE) -_________ (Blood Pressure)-_________  (Heart Rate)-_________    (DATE) -_________ (Blood Pressure)-_________  (Heart Rate)-_________    (DATE) -_________ (Blood Pressure)-_________  (Heart Rate)-_________    (DATE) -_________ (Blood Pressure)-_________  (Heart Rate)-_________    (DATE) -_________ (Blood Pressure)-_________  (Heart Rate)-_________    (DATE) -_________ (Blood Pressure)-_________  (Heart Rate)-_________    (DATE) -_________ (Blood Pressure)-_________  (Heart Rate)-_________

## 2024-01-11 ENCOUNTER — Ambulatory Visit: Admitting: Cardiology

## 2024-01-13 ENCOUNTER — Telehealth: Payer: Self-pay | Admitting: *Deleted

## 2024-01-13 DIAGNOSIS — Z79899 Other long term (current) drug therapy: Secondary | ICD-10-CM

## 2024-01-13 DIAGNOSIS — E782 Mixed hyperlipidemia: Secondary | ICD-10-CM

## 2024-01-13 MED ORDER — FENOFIBRATE 145 MG PO TABS: 145.0000 mg | ORAL_TABLET | Freq: Every day | ORAL | 1 refills | Status: DC

## 2024-01-13 NOTE — Telephone Encounter (Signed)
 Patient informed and verbalized understanding of plan. Lab orders placed for [facility] to be done in November 2025.

## 2024-01-13 NOTE — Telephone Encounter (Signed)
-----   Message from Almarie Crate sent at 01/12/2024  4:58 PM EDT ----- Regarding: New medication Hello Tori,   Please tell patient that after reviewing his chart further and checking with pharmacist, I would like to begin Fenofibrate  145 mg daily to lower his high triglyceride levels. This will prevent issues with his pancreas in the future.   I would also like to obtain a FLP and LFT in 2 months after starting this medication. I have updated his chart but just needs orders placed.  Thanks!   Best, Almarie Crate, NP

## 2024-01-15 ENCOUNTER — Other Ambulatory Visit: Payer: Self-pay | Admitting: Internal Medicine

## 2024-01-15 DIAGNOSIS — I10 Essential (primary) hypertension: Secondary | ICD-10-CM

## 2024-01-18 ENCOUNTER — Other Ambulatory Visit: Payer: Self-pay | Admitting: Cardiology

## 2024-01-23 ENCOUNTER — Telehealth: Payer: Self-pay | Admitting: *Deleted

## 2024-01-23 ENCOUNTER — Ambulatory Visit: Attending: Internal Medicine | Admitting: *Deleted

## 2024-01-23 VITALS — BP 140/78 | HR 89 | Ht 74.0 in | Wt 190.8 lb

## 2024-01-23 DIAGNOSIS — Z0130 Encounter for examination of blood pressure without abnormal findings: Secondary | ICD-10-CM

## 2024-01-23 NOTE — Telephone Encounter (Signed)
Called patient with test results. No answer. Left message to call back.  

## 2024-01-23 NOTE — Telephone Encounter (Signed)
-----   Message from Jayson Sierras sent at 01/23/2024  4:44 PM EDT ----- Recent visit with Ms. Miriam NP reviewed, also went over his medications.  Increase Toprol -XL to 50 mg twice daily and continue remainder of antihypertensive therapy. ----- Message ----- From: Jahn Franchini G, LPN Sent: [id number]   4:40 PM EDT To: Jayson KANDICE Sierras, MD; Almarie Miriam, NP

## 2024-01-23 NOTE — Progress Notes (Signed)
Pt in office for BP check.

## 2024-01-24 NOTE — Telephone Encounter (Signed)
Called patient with test results. No answer. Left message to call back.  

## 2024-01-25 NOTE — Telephone Encounter (Signed)
Called patient with test results. No answer. Left message to call back.  

## 2024-02-01 ENCOUNTER — Encounter: Payer: Self-pay | Admitting: *Deleted

## 2024-02-13 ENCOUNTER — Ambulatory Visit: Admitting: Internal Medicine

## 2024-02-15 ENCOUNTER — Ambulatory Visit (INDEPENDENT_AMBULATORY_CARE_PROVIDER_SITE_OTHER): Admitting: Internal Medicine

## 2024-02-15 VITALS — BP 136/72 | HR 85 | Ht 74.0 in | Wt 190.0 lb

## 2024-02-15 DIAGNOSIS — Z23 Encounter for immunization: Secondary | ICD-10-CM

## 2024-02-15 DIAGNOSIS — Z1211 Encounter for screening for malignant neoplasm of colon: Secondary | ICD-10-CM | POA: Insufficient documentation

## 2024-02-15 DIAGNOSIS — E349 Endocrine disorder, unspecified: Secondary | ICD-10-CM

## 2024-02-15 DIAGNOSIS — R739 Hyperglycemia, unspecified: Secondary | ICD-10-CM

## 2024-02-15 DIAGNOSIS — I10 Essential (primary) hypertension: Secondary | ICD-10-CM

## 2024-02-15 DIAGNOSIS — E559 Vitamin D deficiency, unspecified: Secondary | ICD-10-CM | POA: Diagnosis not present

## 2024-02-15 DIAGNOSIS — I480 Paroxysmal atrial fibrillation: Secondary | ICD-10-CM

## 2024-02-15 DIAGNOSIS — M25562 Pain in left knee: Secondary | ICD-10-CM | POA: Diagnosis not present

## 2024-02-15 DIAGNOSIS — E782 Mixed hyperlipidemia: Secondary | ICD-10-CM

## 2024-02-15 DIAGNOSIS — G8929 Other chronic pain: Secondary | ICD-10-CM | POA: Insufficient documentation

## 2024-02-15 DIAGNOSIS — Z125 Encounter for screening for malignant neoplasm of prostate: Secondary | ICD-10-CM

## 2024-02-15 MED ORDER — TESTOSTERONE 20.25 MG/ACT (1.62%) TD GEL: TRANSDERMAL | 5 refills | Status: AC

## 2024-02-15 MED ORDER — PREDNISONE 20 MG PO TABS: ORAL_TABLET | ORAL | 1 refills | Status: AC

## 2024-02-15 NOTE — Assessment & Plan Note (Addendum)
 Acute on chronic left knee pain Acute pain could be due to bursitis as he reports pain upon lying on that side Unable to take oral NSAIDs as he takes Xarelto  Started oral prednisone  taper Check x-ray of left knee If persistent or worsening, may need orthopedic surgery evaluation

## 2024-02-15 NOTE — Assessment & Plan Note (Addendum)
 BP Readings from Last 1 Encounters:  02/15/24 136/72   Well-controlled with Metoprolol  and Losartan  50 mg QD Home BP around 130s/70s as well Counseled for compliance with the medications Advised DASH diet and moderate exercise/walking, at least 150 mins/week

## 2024-02-15 NOTE — Progress Notes (Addendum)
 Established Patient Office Visit  Subjective:  Patient ID: JESSTIN STUDSTILL, male    DOB: [birth year]  Age: 58 y.o. MRN: [id number]  CC:  Chief Complaint  Patient presents with   Medical Management of Chronic Issues    Follow up    Knee Pain    Having knee pain flare up asking for rx of prednisone .    HPI RADIN RAPTIS is a 58 y.o. male with past medical history of AR s/p AV replacement, nonischemic CM, HTN, paroxysmal A Fib, gout and HLD who presents for f/u of chronic medical conditions.  HTN and A Fib: His BP was wnl today, and reports similar readings in 130s/70s at home.  He takes losartan  50 mg QD.  He takes metoprolol  for history of NICM and paroxysmal A-fib. Patient denies headache, dizziness, chest pain, dyspnea or palpitations.  He is on metoprolol  and Xarelto  currently.  HLD: His recent lipid profile showed elevated triglyceride levels.  LDL level has improved compared to prior.  He is currently taking atorvastatin  80 mg QD.  He is taking fenofibrate  now for hypertriglyceridemia.  He has cut down alcohol use, and has cut down fried food.  Testosterone  deficiency: He has been using testosterone  gel, but started applying to 2 clicks/pumps only for the last 4 weeks.  He has noticed mild improvement in fatigue, but still reports lack of sexual drive.  Left knee pain: He reports acute on chronic left knee pain for the last 2 weeks, which is intermittent, worse with prolonged standing and walking.  Denies any recent injury.  He has chronic bilateral ankle pain as well, has been evaluated by podiatry.  He has tried taking Tylenol  for knee pain with mild relief.  Past Medical History:  Diagnosis Date   Acute gout of left foot 10/03/2019   Alcohol use 06/27/2019   Bone tumor 11/22/2013   Cardiomyopathy (HCC)    a. EF 30 to 35% by echo in 06/2019 in the setting of severe AI   COVID-19 04/16/2020   Encounter for screening for malignant neoplasm of colon 06/27/2019   Glenoid labral  tear 10/27/2010   History of gout 06/27/2019   Hyperlipidemia    Mass of soft tissue of foot    Patent foramen ovale    Rotator cuff tear, right 10/27/2010   S/P aortic valve replacement with bioprosthetic valve 08/09/2019   29 mm Edwards Inspiris Resilia stented bovine pericardial tissue valve   S/P patent foramen ovale closure 08/09/2019   Severe aortic insufficiency     Past Surgical History:  Procedure Laterality Date   AORTIC VALVE REPLACEMENT N/A 08/09/2019   Procedure: AORTIC VALVE REPLACEMENT (AVR) using Celestia CROOKED Resilia 29 MM Aortic Valve.;  Surgeon: Dusty Sudie DEL, MD;  Location: [facility] OR;  Service: Open Heart Surgery;  Laterality: N/A;   BACK SURGERY     multiple back surgery   Benign tumor     Left shin   BUBBLE STUDY  07/05/2019   Procedure: BUBBLE STUDY;  Surgeon: Debera Jayson MATSU, MD;  Location: [facility] ORS;  Service: Cardiovascular;;   CARDIAC VALVE REPLACEMENT N/A    Phreesia 02/19/2020   HERNIA REPAIR     REPAIR OF PATENT FORAMEN OVALE N/A 08/09/2019   Procedure: Closure Of Patent Foramen Ovale;  Surgeon: Dusty Sudie DEL, MD;  Location: [facility] OR;  Service: Open Heart Surgery;  Laterality: N/A;   RIGHT/LEFT HEART CATH AND CORONARY ANGIOGRAPHY N/A 07/26/2019   Procedure: RIGHT/LEFT HEART CATH AND CORONARY ANGIOGRAPHY;  Surgeon: Verlin Lonni BIRCH, MD;  Location: [facility] INVASIVE CV LAB;  Service: Cardiovascular;  Laterality: N/A;   SHOULDER SURGERY Left    SHOULDER SURGERY Right    TEE WITHOUT CARDIOVERSION N/A 07/05/2019   Procedure: TRANSESOPHAGEAL ECHOCARDIOGRAM (TEE) WITH PROPOFOL ;  Surgeon: Debera Jayson MATSU, MD;  Location: [facility];  Service: Cardiovascular;  Laterality: N/A;   TEE WITHOUT CARDIOVERSION N/A 08/09/2019   Procedure: TRANSESOPHAGEAL ECHOCARDIOGRAM (TEE);  Surgeon: Dusty Sudie DEL, MD;  Location: [facility] OR;  Service: Open Heart Surgery;  Laterality: N/A;   Thumb surgery Left x3   VASECTOMY N/A    Phreesia 02/19/2020    Family History  Problem  Relation Age of Onset   Other Father        Kidney    Social History   Socioeconomic History   Marital status: Married    Spouse name: Randolm    Number of children: 4   Years of education: Not on file   Highest education level: Associate degree: occupational, Scientist, product/process development, or vocational program  Occupational History   Occupation: Radio producer    Employer: [employer]  Tobacco Use   Smoking status: Every Day    Current packs/day: 0.00    Types: Cigarettes    Last attempt to quit: 08/02/2019    Years since quitting: 4.5   Smokeless tobacco: Never   Tobacco comments:    Smoking 9/10 ciggs per day   Vaping Use   Vaping status: Never Used  Substance and Sexual Activity   Alcohol use: Yes    Alcohol/week: 3.0 standard drinks of alcohol    Types: 3 Glasses of wine per week    Comment: a little wine   Drug use: No   Sexual activity: Not on file  Other Topics Concern   Not on file  Social History Narrative   Live Randolm wife of 2 years, previously was widowed from first wife from cancer 01/2011      Four children      Enjoy: golf, target shooting       Diet:    Caffeine: 16-20 oz coffee    Water: 6-7 bottles       Wears seat belt    Smoke detectors at home    Does not use phone while driving    Social Drivers of Health   Financial Resource Strain: Low Risk  (02/15/2024)   Overall Financial Resource Strain (CARDIA)    Difficulty of Paying Living Expenses: Not very hard  Food Insecurity: No Food Insecurity (02/15/2024)   Hunger Vital Sign    Worried About Running Out of Food in the Last Year: Never true    Ran Out of Food in the Last Year: Never true  Transportation Needs: No Transportation Needs (02/15/2024)   PRAPARE - Administrator, Civil Service (Medical): No    Lack of Transportation (Non-Medical): No  Physical Activity: Sufficiently Active (02/15/2024)   Exercise Vital Sign    Days of Exercise per Week: 5 days    Minutes of Exercise per  Session: 40 min  Stress: No Stress Concern Present (02/15/2024)   [employer] of Occupational Health - Occupational Stress Questionnaire    Feeling of Stress: Only a little  Social Connections: Moderately Isolated (02/15/2024)   Social Connection and Isolation Panel    Frequency of Communication with Friends and Family: Twice a week    Frequency of Social Gatherings with Friends and Family: Once a week    Attends Religious Services: Never  Active Member of Clubs or Organizations: No    Attends Banker Meetings: Not on file    Marital Status: Married  Intimate Partner Violence: Not At Risk (11/06/2022)   Humiliation, Afraid, Rape, and Kick questionnaire    Fear of Current or Ex-Partner: No    Emotionally Abused: No    Physically Abused: No    Sexually Abused: No    Outpatient Medications Prior to Visit  Medication Sig Dispense Refill   allopurinol  (ZYLOPRIM ) 100 MG tablet TAKE 1 TABLET BY MOUTH EVERY DAY 90 tablet 2   atorvastatin  (LIPITOR) 40 MG tablet TAKE 1 TABLET BY MOUTH EVERY DAY 90 tablet 3   Cholecalciferol (VITAMIN D3) 50 MCG (2000 UT) TABS Take 2,000 Units by mouth daily.     Coenzyme Q10 (COQ10) 100 MG CAPS Take 100 mg by mouth daily.     fenofibrate  (TRICOR ) 145 MG tablet Take 1 tablet (145 mg total) by mouth daily. 90 tablet 1   Glucosamine-Chondroitin (GLUCOSAMINE CHONDR COMPLEX PO) Take 2 tablets by mouth every morning.     losartan  (COZAAR ) 50 MG tablet TAKE 1 TABLET BY MOUTH EVERY DAY 90 tablet 1   metoprolol  succinate (TOPROL -XL) 50 MG 24 hr tablet TAKE 1 TABLET BY MOUTH DAILY. TAKE WITH OR IMMEDIATELY FOLLOWING A MEAL. 90 tablet 3   Multiple Vitamin (MULTIVITAMIN WITH MINERALS) TABS tablet Take 1 tablet by mouth daily.     omega-3 acid ethyl esters (LOVAZA ) 1 g capsule TAKE 2,000 MG BY MOUTH 2 (TWO) TIMES DAILY. 180 capsule 3   spironolactone  (ALDACTONE ) 25 MG tablet Take 0.5 tablets (12.5 mg total) by mouth daily. 90 tablet 3   XARELTO  20 MG  TABS tablet TAKE 1 TABLET BY MOUTH DAILY WITH SUPPER 90 tablet 1   Testosterone  20.25 MG/ACT (1.62%) GEL PLACE 2 PUMPS OVER THE SHOULDER AREA ONCE DAILY 75 g 2   No facility-administered medications prior to visit.    Allergies  Allergen Reactions   Porcine (Pork) Protein-Containing Drug Products Other (See Comments)    Patient does not eat pork or eggs   Ultram [Tramadol Hcl] Itching    Tunnel vision warm feeling all over and wanted to jump out of his skin    ROS Review of Systems  Constitutional:  Positive for fatigue. Negative for chills and fever.  HENT:  Negative for congestion, sinus pressure and sinus pain.   Eyes:  Negative for pain and discharge.  Respiratory:  Negative for cough and shortness of breath.   Cardiovascular:  Negative for chest pain and palpitations.  Gastrointestinal:  Negative for diarrhea, nausea and vomiting.  Genitourinary:  Negative for dysuria and hematuria.  Musculoskeletal:  Positive for arthralgias (B/l foot, left knee). Negative for neck pain and neck stiffness.  Skin:  Negative for rash.  Neurological:  Negative for weakness and numbness.  Psychiatric/Behavioral:  Negative for agitation and behavioral problems.       Objective:    Physical Exam Vitals reviewed.  Constitutional:      General: He is not in acute distress.    Appearance: He is not diaphoretic.  HENT:     Head: Normocephalic and atraumatic.     Nose: Nose normal.     Mouth/Throat:     Mouth: Mucous membranes are moist.  Eyes:     General: No scleral icterus.    Extraocular Movements: Extraocular movements intact.  Cardiovascular:     Rate and Rhythm: Normal rate and regular rhythm.     Heart sounds:  Normal heart sounds. No murmur heard. Pulmonary:     Breath sounds: Normal breath sounds. No wheezing or rales.  Genitourinary:    Penis: Normal.   Musculoskeletal:     Cervical back: Neck supple. No tenderness.     Left knee: No swelling. Normal range of motion.  Tenderness present over the lateral joint line.     Right lower leg: No edema.     Left lower leg: No edema.  Skin:    General: Skin is warm.     Findings: No rash.  Neurological:     General: No focal deficit present.     Mental Status: He is alert and oriented to person, place, and time.     Sensory: No sensory deficit.     Motor: No weakness.  Psychiatric:        Mood and Affect: Mood normal.        Behavior: Behavior normal.     BP 136/72 (BP Location: Left Arm)   Pulse 85   Ht 6' 2 (1.88 m)   Wt 190 lb (86.2 kg)   SpO2 98%   BMI 24.39 kg/m  Wt Readings from Last 3 Encounters:  02/15/24 190 lb (86.2 kg)  01/23/24 190 lb 12.8 oz (86.5 kg)  01/09/24 192 lb 12.8 oz (87.5 kg)    Lab Results  Component Value Date   TSH 1.340 05/17/2023   Lab Results  Component Value Date   WBC 6.2 11/07/2023   HGB 15.8 11/07/2023   HCT 47.3 11/07/2023   MCV 97 11/07/2023   PLT 166 11/07/2023   Lab Results  Component Value Date   NA 142 11/07/2023   K 4.6 11/07/2023   CO2 20 11/07/2023   GLUCOSE 90 11/07/2023   BUN 16 11/07/2023   CREATININE 0.69 (L) 11/07/2023   BILITOT 0.5 11/07/2023   ALKPHOS 70 11/07/2023   AST 27 11/07/2023   ALT 36 11/07/2023   PROT 6.7 11/07/2023   ALBUMIN  4.4 11/07/2023   CALCIUM  9.3 11/07/2023   ANIONGAP 6 11/07/2022   EGFR 109 11/07/2023   Lab Results  Component Value Date   CHOL 181 11/07/2023   Lab Results  Component Value Date   HDL 46 11/07/2023   Lab Results  Component Value Date   LDLCALC 66 11/07/2023   Lab Results  Component Value Date   TRIG 447 (H) 11/07/2023   Lab Results  Component Value Date   CHOLHDL 3.9 11/07/2023   Lab Results  Component Value Date   HGBA1C 5.5 05/17/2023      Assessment & Plan:   Problem List Items Addressed This Visit       Cardiovascular and Mediastinum   Essential hypertension - Primary   BP Readings from Last 1 Encounters:  02/15/24 136/72   Well-controlled with Metoprolol   and Losartan  50 mg QD Home BP around 130s/70s as well Counseled for compliance with the medications Advised DASH diet and moderate exercise/walking, at least 150 mins/week      Relevant Orders   CMP14+EGFR   Paroxysmal atrial fibrillation (HCC)   Rate controlled with metoprolol  On Xarelto  for [facility] Followed by Cardiology      Relevant Orders   CBC with Differential/Platelet     Other   Hyperlipidemia   On atorvastatin  40 mg QD Recent lipid profile reviewed -elevated TG, but better compared to prior On fenofibrate  now Continue to follow DASH diet Check lipid profile      Relevant Orders   Lipid Profile  Vitamin D  deficiency   Advised to take vitamin D  2000 IU QD      Prostate cancer screening   Ordered PSA after discussing its limitations for prostate cancer screening, including false positive results leading to additional investigations.      Relevant Orders   PSA   Testosterone  deficiency   Reports mild fatigue Checked total and free testosterone  - low on last blood test Discussed  testosterone  replacement therapy -prescribed AndroGel  for better safety profile and easier access, advised to apply 2 clicks daily Would avoid testosterone  injections for now - if testosterone  level still low, would refer to Urology for testosterone  injectable treatment      Relevant Medications   Testosterone  20.25 MG/ACT (1.62%) GEL   Other Relevant Orders   Testosterone ,Free and Total   Chronic pain of left knee   Acute on chronic left knee pain Acute pain could be due to bursitis as he reports pain upon lying on that side Unable to take oral NSAIDs as he takes Xarelto  Started oral prednisone  taper Check x-ray of left knee If persistent or worsening, may need orthopedic surgery evaluation      Relevant Medications   predniSONE  (DELTASONE ) 20 MG tablet   Other Relevant Orders   DG Knee Complete 4 Views Left   Colon cancer screening   Discussed about colonoscopy and cologuard  - benefits of each procedure discussed. Patient prefers Cologuard - ordered.      Relevant Orders   Cologuard   Other Visit Diagnoses       Hyperglycemia       Relevant Orders   Hemoglobin A1c     Encounter for immunization       Relevant Orders   Flu vaccine trivalent PF, 6mos and older(Flulaval,Afluria,Fluarix,Fluzone) (Completed)       Meds ordered this encounter  Medications   Testosterone  20.25 MG/ACT (1.62%) GEL    Sig: PLACE 2 PUMPS OVER THE SHOULDER AREA ONCE DAILY    Dispense:  75 g    Refill:  5   predniSONE  (DELTASONE ) 20 MG tablet    Sig: Take 2 tablets (40 mg total) by mouth daily with breakfast for 2 days, THEN 1 tablet (20 mg total) daily with breakfast for 2 days, THEN 0.5 tablets (10 mg total) daily with breakfast for 4 days.    Dispense:  8 tablet    Refill:  1    Follow-up: Return in about 4 months (around 06/17/2024).    Suzzane MARLA Blanch, MD

## 2024-02-15 NOTE — Assessment & Plan Note (Signed)
 Reports mild fatigue Checked total and free testosterone  - low on last blood test Discussed  testosterone  replacement therapy -prescribed AndroGel  for better safety profile and easier access, advised to apply 2 clicks daily Would avoid testosterone  injections for now - if testosterone  level still low, would refer to Urology for testosterone  injectable treatment

## 2024-02-15 NOTE — Assessment & Plan Note (Signed)
 On atorvastatin  40 mg QD Recent lipid profile reviewed -elevated TG, but better compared to prior On fenofibrate  now Continue to follow DASH diet Check lipid profile

## 2024-02-15 NOTE — Assessment & Plan Note (Signed)
Rate controlled with metoprolol On Xarelto for AC Followed by Cardiology 

## 2024-02-15 NOTE — Assessment & Plan Note (Signed)
Advised to take vitamin D 2000 IU QD

## 2024-02-15 NOTE — Assessment & Plan Note (Signed)
 Ordered PSA after discussing its limitations for prostate cancer screening, including false positive results leading to additional investigations.

## 2024-02-15 NOTE — Patient Instructions (Signed)
 Please continue to take medications as prescribed.  Please continue to follow low salt diet and perform moderate exercise/walking at least 150 mins/week.  Please get fasting blood tests done before the next visit.

## 2024-02-15 NOTE — Assessment & Plan Note (Signed)
 Discussed about colonoscopy and cologuard - benefits of each procedure discussed. Patient prefers Cologuard - ordered.

## 2024-03-03 DIAGNOSIS — Z1211 Encounter for screening for malignant neoplasm of colon: Secondary | ICD-10-CM | POA: Diagnosis not present

## 2024-03-11 LAB — COLOGUARD: COLOGUARD: POSITIVE — AB

## 2024-03-12 ENCOUNTER — Ambulatory Visit: Payer: Self-pay | Admitting: Internal Medicine

## 2024-03-12 ENCOUNTER — Other Ambulatory Visit: Payer: Self-pay | Admitting: Internal Medicine

## 2024-03-12 DIAGNOSIS — R195 Other fecal abnormalities: Secondary | ICD-10-CM | POA: Insufficient documentation

## 2024-03-13 ENCOUNTER — Encounter (INDEPENDENT_AMBULATORY_CARE_PROVIDER_SITE_OTHER): Payer: Self-pay | Admitting: *Deleted

## 2024-03-15 NOTE — Progress Notes (Signed)
 Donald [name]                                          MRN: [id number]   03/15/2024   The [facility] Quality Team Specialist reviewed this patient medical record for the purposes of chart review for care gap closure. The following were reviewed: abstraction for care gap closure-controlling blood pressure.    [facility] Quality Team

## 2024-04-04 ENCOUNTER — Ambulatory Visit (INDEPENDENT_AMBULATORY_CARE_PROVIDER_SITE_OTHER): Admitting: Gastroenterology

## 2024-04-04 ENCOUNTER — Encounter (INDEPENDENT_AMBULATORY_CARE_PROVIDER_SITE_OTHER): Payer: Self-pay | Admitting: Gastroenterology

## 2024-04-04 VITALS — BP 131/83 | HR 93 | Temp 97.9°F | Ht 74.0 in | Wt 188.3 lb

## 2024-04-04 DIAGNOSIS — R195 Other fecal abnormalities: Secondary | ICD-10-CM | POA: Diagnosis not present

## 2024-04-04 DIAGNOSIS — Z7901 Long term (current) use of anticoagulants: Secondary | ICD-10-CM | POA: Diagnosis not present

## 2024-04-04 NOTE — Progress Notes (Signed)
 Joanna Hall Faizan Meya Clutter , M.D. Gastroenterology & Hepatology [facility] [address] [place], [place] [address] Primary Care Physician: [name], MD [address] [place] [address]  Chief Complaint: Positive Cologuard test  History of Present Illness:  Donald [name] is a 58 y.o. male with f AR s/p AV replacement on Xarelto , nonischemic CM, HTN, paroxysmal A Fib, gout and HLD who presents for evaluation of Positive Cologuard test  Patient reports that he did not take Xarelto  for past 5 days because it was not in pharmacy stock but took it this morning.  Denies any active GI complaints.  Never had a colonoscopy The patient denies having any nausea, vomiting, fever, chills, hematochezia, melena, hematemesis, abdominal distention, abdominal pain, diarrhea, jaundice, pruritus or weight loss.  Last ZHI:wnwz Last Colonoscopy:none  Positive Cologuard test 12/05/2023  FHx: neg for any gastrointestinal/liver disease, no malignancies  Labs from 10/2023 normal liver enzymes hemoglobin 15.8 platelet 166   past Medical History: Past Medical History:  Diagnosis Date   Acute gout of left foot 10/03/2019   Alcohol use 06/27/2019   Bone tumor 11/22/2013   Cardiomyopathy (HCC)    a. EF 30 to 35% by echo in 06/2019 in the setting of severe AI   COVID-19 04/16/2020   Encounter for screening for malignant neoplasm of colon 06/27/2019   Glenoid labral tear 10/27/2010   History of gout 06/27/2019   Hyperlipidemia    Mass of soft tissue of foot    Patent foramen ovale    Rotator cuff tear, right 10/27/2010   S/P aortic valve replacement with bioprosthetic valve 08/09/2019   29 mm Edwards Inspiris Resilia stented bovine pericardial tissue valve   S/P patent foramen ovale closure 08/09/2019   Severe aortic insufficiency     Past Surgical History: Past Surgical History:  Procedure Laterality Date   AORTIC VALVE REPLACEMENT N/A 08/09/2019    Procedure: AORTIC VALVE REPLACEMENT (AVR) using Celestia CROOKED Resilia 29 MM Aortic Valve.;  Surgeon: Dusty Sudie DEL, MD;  Location: [facility] OR;  Service: Open Heart Surgery;  Laterality: N/A;   BACK SURGERY     multiple back surgery   Benign tumor     Left shin   BUBBLE STUDY  07/05/2019   Procedure: BUBBLE STUDY;  Surgeon: Debera Jayson MATSU, MD;  Location: [facility] ORS;  Service: Cardiovascular;;   CARDIAC VALVE REPLACEMENT N/A    Phreesia 02/19/2020   HERNIA REPAIR     REPAIR OF PATENT FORAMEN OVALE N/A 08/09/2019   Procedure: Closure Of Patent Foramen Ovale;  Surgeon: Dusty Sudie DEL, MD;  Location: [facility] OR;  Service: Open Heart Surgery;  Laterality: N/A;   RIGHT/LEFT HEART CATH AND CORONARY ANGIOGRAPHY N/A 07/26/2019   Procedure: RIGHT/LEFT HEART CATH AND CORONARY ANGIOGRAPHY;  Surgeon: Verlin Lonni BIRCH, MD;  Location: [facility] INVASIVE CV LAB;  Service: Cardiovascular;  Laterality: N/A;   SHOULDER SURGERY Left    SHOULDER SURGERY Right    TEE WITHOUT CARDIOVERSION N/A 07/05/2019   Procedure: TRANSESOPHAGEAL ECHOCARDIOGRAM (TEE) WITH PROPOFOL ;  Surgeon: Debera Jayson MATSU, MD;  Location: [facility] ORS;  Service: Cardiovascular;  Laterality: N/A;   TEE WITHOUT CARDIOVERSION N/A 08/09/2019   Procedure: TRANSESOPHAGEAL ECHOCARDIOGRAM (TEE);  Surgeon: Dusty Sudie DEL, MD;  Location: [facility] OR;  Service: Open Heart Surgery;  Laterality: N/A;   Thumb surgery Left x3   VASECTOMY N/A    Phreesia 02/19/2020    Family History: Family History  Problem Relation Age of Onset  Other Father        Kidney    Social History: Social History   Tobacco Use  Smoking Status Every Day   Current packs/day: 0.00   Types: Cigarettes   Last attempt to quit: 08/02/2019   Years since quitting: 4.6  Smokeless Tobacco Never  Tobacco Comments   Smoking 9/10 ciggs per day    Social History   Substance and Sexual Activity  Alcohol Use Yes   Alcohol/week: 3.0 standard drinks of alcohol   Types: 3 Glasses of wine per  week   Comment: a little wine   Social History   Substance and Sexual Activity  Drug Use No    Allergies: Allergies  Allergen Reactions   Porcine (Pork) Protein-Containing Drug Products Other (See Comments)    Patient does not eat pork or eggs   Ultram [Tramadol Hcl] Itching    Tunnel vision warm feeling all over and wanted to jump out of his skin    Medications: Current Outpatient Medications  Medication Sig Dispense Refill   allopurinol  (ZYLOPRIM ) 100 MG tablet TAKE 1 TABLET BY MOUTH EVERY DAY 90 tablet 2   atorvastatin  (LIPITOR) 40 MG tablet TAKE 1 TABLET BY MOUTH EVERY DAY 90 tablet 3   Cholecalciferol (VITAMIN D3) 50 MCG (2000 UT) TABS Take 2,000 Units by mouth daily.     Coenzyme Q10 (COQ10) 100 MG CAPS Take 100 mg by mouth daily.     Glucosamine-Chondroitin (GLUCOSAMINE CHONDR COMPLEX PO) Take 2 tablets by mouth every morning.     losartan  (COZAAR ) 50 MG tablet TAKE 1 TABLET BY MOUTH EVERY DAY 90 tablet 1   metoprolol  succinate (TOPROL -XL) 50 MG 24 hr tablet TAKE 1 TABLET BY MOUTH DAILY. TAKE WITH OR IMMEDIATELY FOLLOWING A MEAL. 90 tablet 3   Multiple Vitamin (MULTIVITAMIN WITH MINERALS) TABS tablet Take 1 tablet by mouth daily.     omega-3 acid ethyl esters (LOVAZA ) 1 g capsule TAKE 2,000 MG BY MOUTH 2 (TWO) TIMES DAILY. 180 capsule 3   Testosterone  20.25 MG/ACT (1.62%) GEL PLACE 2 PUMPS OVER THE SHOULDER AREA ONCE DAILY 75 g 5   XARELTO  20 MG TABS tablet TAKE 1 TABLET BY MOUTH DAILY WITH SUPPER 90 tablet 1   fenofibrate  (TRICOR ) 145 MG tablet Take 1 tablet (145 mg total) by mouth daily. (Patient not taking: Reported on 04/04/2024) 90 tablet 1   spironolactone  (ALDACTONE ) 25 MG tablet Take 0.5 tablets (12.5 mg total) by mouth daily. (Patient not taking: Reported on 04/04/2024) 90 tablet 3   No current facility-administered medications for this visit.    Review of Systems: GENERAL: negative for malaise, night sweats HEENT: No changes in hearing or vision, no nose  bleeds or other nasal problems. NECK: Negative for lumps, goiter, pain and significant neck swelling RESPIRATORY: Negative for cough, wheezing CARDIOVASCULAR: Negative for chest pain, leg swelling, palpitations, orthopnea GI: SEE HPI MUSCULOSKELETAL: Negative for joint pain or swelling, back pain, and muscle pain. SKIN: Negative for lesions, rash HEMATOLOGY Negative for prolonged bleeding, bruising easily, and swollen nodes. ENDOCRINE: Negative for cold or heat intolerance, polyuria, polydipsia and goiter. NEURO: negative for tremor, gait imbalance, syncope and seizures. The remainder of the review of systems is noncontributory.   Physical Exam: BP 131/83   Pulse 93   Temp 97.9 F (36.6 C)   Ht 6' 2 (1.88 m)   Wt 188 lb 4.8 oz (85.4 kg)   BMI 24.18 kg/m  GENERAL: The patient is AO x3, in no acute distress. HEENT:  Head is normocephalic and atraumatic. EOMI are intact. Mouth is well hydrated and without lesions. NECK: Supple. No masses LUNGS: Clear to auscultation. No presence of rhonchi/wheezing/rales. Adequate chest expansion HEART: RRR, normal s1 and s2. ABDOMEN: Soft, nontender, no guarding, no peritoneal signs, and nondistended. BS +. No masses.  Imaging/Labs: as above     Latest Ref Rng & Units 11/07/2023    8:31 AM 05/17/2023    9:05 AM 11/07/2022    7:20 AM  CBC  WBC 3.4 - 10.8 x10E3/uL 6.2  4.8  7.9   Hemoglobin 13.0 - 17.7 g/dL 84.1  84.1  85.3   Hematocrit 37.5 - 51.0 % 47.3  46.4  42.5   Platelets 150 - 450 x10E3/uL 166  174  148    No results found for: IRON, TIBC, FERRITIN  I personally reviewed and interpreted the available labs, imaging and endoscopic files.  Impression and Plan:  NEEV MCMAINS is a 58 y.o. male with f AR s/p AV replacement on Xarelto , nonischemic CM, HTN, paroxysmal A Fib, gout and HLD who presents for evaluation of Positive Cologuard test  #Positive cologuard   Patient does not have any high risk factors for colorectal cancer  malignancy.  He has been asymptomatic. Discussed cologuard test results in detail, specifically what it means when the test is positive or negative.  Discussed that there is a possibility that even when the test is positive there may not be a polyp found on colonoscopy. More than 50% of the office visit was dedicated to discussing the procedure, including the day of and risks involved. Patient understands what the procedure involves including the benefits and any risks. Patient understands alternatives to the proposed procedure. Risks including (but not limited to) bleeding, tearing of the lining (perforation), rupture of adjacent organs, problems with heart and lung function, infection, and medication reactions. A small percentage of complications may require surgery, hospitalization, repeat endoscopic procedure, and/or transfusion. A small percentage of polyps and other tumors may not be seen.  - Schedule colonoscopy  - Patient has a mechanical aortic valve and is on Xarelto .  Will obtain cardiology clearance to hold Xarelto  for 2 days and if there is any need for bridging with Lovenox  before the procedure  All questions were answered.      Donald Kindred Faizan Angelise Petrich, MD Gastroenterology and Hepatology [facility]   This chart has been completed using [facility] Dictation software, and while attempts have been made to ensure accuracy , certain words and phrases may not be transcribed as intended

## 2024-04-04 NOTE — Patient Instructions (Signed)
 It was very nice to meet you today, as dicussed with will plan for the following :  1) Colonoscopy after cardiology clearance to hold Xarelto  for 2 days and if you need bridging with lovenox 

## 2024-04-06 ENCOUNTER — Telehealth (INDEPENDENT_AMBULATORY_CARE_PROVIDER_SITE_OTHER): Payer: Self-pay

## 2024-04-06 NOTE — Telephone Encounter (Signed)
° ° °  04/06/2024  Donald [name] 29-Jan-1966  What type of surgery is being performed? Colonoscopy   When is surgery scheduled? tbd  What type of clearance is required (medical or pharmacy to hold medication or both? medication  Are there any medications that need to be held prior to surgery and how long? Xarelto , hold 2 days prior to procedure. Will patient need a lovenox  bridge?  Name of physician performing surgery?  Dr. Cinderella Rouse [facility] at [facility] Phone: [phone] Fax: [phone]  Anethesia type (none, local, MAC, general)? MAC

## 2024-04-06 NOTE — Telephone Encounter (Signed)
 Pharmacy please advise on holding Xarelto  for 2 days prior to colonoscopy,  scheduled for TBD. Last labs 02/15/2024. Thank you.

## 2024-04-10 ENCOUNTER — Ambulatory Visit: Attending: Nurse Practitioner | Admitting: Nurse Practitioner

## 2024-04-10 VITALS — BP 143/87 | HR 57 | Ht 74.0 in | Wt 193.6 lb

## 2024-04-10 DIAGNOSIS — Z01810 Encounter for preprocedural cardiovascular examination: Secondary | ICD-10-CM | POA: Diagnosis not present

## 2024-04-10 DIAGNOSIS — I350 Nonrheumatic aortic (valve) stenosis: Secondary | ICD-10-CM | POA: Diagnosis not present

## 2024-04-10 DIAGNOSIS — Z79899 Other long term (current) drug therapy: Secondary | ICD-10-CM

## 2024-04-10 DIAGNOSIS — I5020 Unspecified systolic (congestive) heart failure: Secondary | ICD-10-CM | POA: Diagnosis not present

## 2024-04-10 DIAGNOSIS — I10 Essential (primary) hypertension: Secondary | ICD-10-CM

## 2024-04-10 DIAGNOSIS — I480 Paroxysmal atrial fibrillation: Secondary | ICD-10-CM | POA: Diagnosis not present

## 2024-04-10 MED ORDER — LOSARTAN POTASSIUM 50 MG PO TABS: 75.0000 mg | ORAL_TABLET | Freq: Every day | ORAL | 6 refills | Status: AC

## 2024-04-10 NOTE — Patient Instructions (Addendum)
 Medication Instructions:   Increase Losartan  to 75mg  daily  Continue all other medications.     Labwork:  BMET - orders given today Please do in 1-2 weeks  Office will contact with results via phone, letter or mychart.     Testing/Procedures:  none  Follow-Up:  6 months   Any Other Special Instructions Will Be Listed Below (If Applicable).  BP log x 2-3 weeks   If you need a refill on your cardiac medications before your next appointment, please call your pharmacy.

## 2024-04-11 NOTE — Telephone Encounter (Signed)
 Patient seen by Almarie Crate yesterday in clinic, she may have routed request for holding to pharmacist. Will connect her with this message train via routing. Otherwise no need for additional preop APP input, will remove from box.

## 2024-04-12 ENCOUNTER — Telehealth: Payer: Self-pay | Admitting: Pharmacist Clinician (PhC)/ Clinical Pharmacy Specialist

## 2024-04-12 NOTE — Telephone Encounter (Signed)
 Patient with diagnosis of atrial fibrillation on Xarelto  for anticoagulation.    Procedure: colonoscopy Date of procedure: TBD - January   CHA2DS2-VASc Score = 2   This indicates a 2.2% annual risk of stroke. The patient's score is based upon: CHF History: 1 HTN History: 1 Diabetes History: 0 Stroke History: 0 Vascular Disease History: 0 Age Score: 0 Gender Score: 0    CrCl > 100 Platelet count 166  Patient has not had an Afib/aflutter ablation in the last 3 months, DCCV within the last 4 weeks or a watchman implanted in the last 45 days   Per office protocol, patient can hold Xarelto  for 2 days prior to procedure.   Patient will not need bridging with Lovenox  (enoxaparin ) around procedure.  **This guidance is not considered finalized until pre-operative APP has relayed final recommendations.**

## 2024-04-12 NOTE — Telephone Encounter (Signed)
° °  Name: Donald [name]  DOB: 1965/10/13  MRN: [id number]   Primary Cardiologist: Jayson Sierras, MD  Chart reviewed as part of pre-operative protocol coverage. Donald [name] was last seen on 04/10/2024 by Almarie Crate, NP.  Per office note Mr. Frohlich's perioperative risk of a major cardiac event is 0.9% according to the Revised Cardiac Risk Index (RCRI).  Therefore, he is at low risk for perioperative complications.   His functional capacity is excellent at 7.25 METs according to the Duke Activity Status Index (DASI). Recommendations: According to ACC/AHA guidelines, no further cardiovascular testing needed.  The patient may proceed to surgery at acceptable risk.  Per Pharm D, patient has not had an Afib/aflutter ablation within the last 3 months, DCCV within the last 4 weeks, or Watchman in the last 45 days. Patient may hold Xarelto  for 2 days prior to procedure.  Patient will not need bridging with Lovenox  around procedure.    I will route this recommendation to the requesting party via Epic fax function and remove from pre-op pool. Please call with questions.  Barnie Hila, NP 04/12/2024, 2:47 PM

## 2024-04-12 NOTE — Telephone Encounter (Signed)
 Patient with diagnosis of atrial fibrillation on Xarelto  for anticoagulation.    What type of surgery is being performed? Colonoscopy    When is surgery scheduled? tbd  CHA2DS2-VASc Score = 2   This indicates a 2.2% annual risk of stroke. The patient's score is based upon: CHF History: 1 HTN History: 1 Diabetes History: 0 Stroke History: 0 Vascular Disease History: 0 Age Score: 0 Gender Score: 0   CrCl 137 ml/min Platelet count 166 K  Patient has not had an Afib/aflutter ablation in the last 3 months, DCCV within the last 4 weeks or a watchman implanted in the last 45 days   Per office protocol, patient can hold Xarelto  for 2 days prior to procedure.    Patient will not need bridging with Lovenox  (enoxaparin ) around procedure.  **This guidance is not considered finalized until pre-operative APP has relayed final recommendations.**

## 2024-05-11 ENCOUNTER — Telehealth (INDEPENDENT_AMBULATORY_CARE_PROVIDER_SITE_OTHER): Payer: Self-pay

## 2024-05-11 MED ORDER — PEG 3350-KCL-NA BICARB-NACL 420 G PO SOLR: 4000.0000 mL | Freq: Once | ORAL | 0 refills | Status: AC

## 2024-05-11 NOTE — Telephone Encounter (Signed)
 Prior auth not required according to carelon

## 2024-05-11 NOTE — Telephone Encounter (Signed)
 Spoke with patient, scheduled TCS for 05/31/2024 at 11:15am. Rx sent to pharmacy. Instructions mailed.

## 2024-05-28 ENCOUNTER — Other Ambulatory Visit: Payer: Self-pay

## 2024-05-28 ENCOUNTER — Encounter (HOSPITAL_COMMUNITY): Admission: RE | Admit: 2024-05-28 | Source: Ambulatory Visit

## 2024-05-28 ENCOUNTER — Encounter (HOSPITAL_COMMUNITY): Payer: Self-pay

## 2024-05-31 ENCOUNTER — Ambulatory Visit (HOSPITAL_COMMUNITY): Admitting: Anesthesiology

## 2024-05-31 ENCOUNTER — Ambulatory Visit (HOSPITAL_COMMUNITY)
Admission: RE | Admit: 2024-05-31 | Discharge: 2024-05-31 | Disposition: A | Discharge status: Home / Self Care | Source: Home / Self Care | Attending: Gastroenterology | Admitting: Gastroenterology

## 2024-05-31 ENCOUNTER — Encounter (HOSPITAL_COMMUNITY)
Admission: RE | Disposition: A | Discharge status: Home / Self Care | Payer: Self-pay | Source: Home / Self Care | Attending: Gastroenterology

## 2024-05-31 ENCOUNTER — Encounter (HOSPITAL_COMMUNITY): Payer: Self-pay | Admitting: Gastroenterology

## 2024-05-31 ENCOUNTER — Encounter (INDEPENDENT_AMBULATORY_CARE_PROVIDER_SITE_OTHER): Payer: Self-pay | Admitting: *Deleted

## 2024-05-31 DIAGNOSIS — K635 Polyp of colon: Secondary | ICD-10-CM

## 2024-05-31 DIAGNOSIS — D125 Benign neoplasm of sigmoid colon: Secondary | ICD-10-CM

## 2024-05-31 DIAGNOSIS — Z1211 Encounter for screening for malignant neoplasm of colon: Secondary | ICD-10-CM

## 2024-05-31 DIAGNOSIS — R195 Other fecal abnormalities: Secondary | ICD-10-CM

## 2024-05-31 DIAGNOSIS — K648 Other hemorrhoids: Secondary | ICD-10-CM

## 2024-05-31 DIAGNOSIS — D123 Benign neoplasm of transverse colon: Secondary | ICD-10-CM

## 2024-05-31 DIAGNOSIS — D120 Benign neoplasm of cecum: Secondary | ICD-10-CM

## 2024-05-31 DIAGNOSIS — K5730 Diverticulosis of large intestine without perforation or abscess without bleeding: Secondary | ICD-10-CM

## 2024-05-31 DIAGNOSIS — D122 Benign neoplasm of ascending colon: Secondary | ICD-10-CM

## 2024-05-31 LAB — HM COLONOSCOPY

## 2024-05-31 MED ORDER — DEXMEDETOMIDINE HCL IN NACL 80 MCG/20ML IV SOLN
INTRAVENOUS | Status: DC | PRN
  Administered 2024-05-31: 8 ug via INTRAVENOUS

## 2024-05-31 MED ORDER — LIDOCAINE 2% (20 MG/ML) 5 ML SYRINGE
INTRAMUSCULAR | Status: DC | PRN
  Administered 2024-05-31: 60 mg via INTRAVENOUS

## 2024-05-31 MED ORDER — PROPOFOL 10 MG/ML IV BOLUS
INTRAVENOUS | Status: DC | PRN
  Administered 2024-05-31 (×2): 100 mg via INTRAVENOUS
  Administered 2024-05-31: 150 ug/kg/min via INTRAVENOUS

## 2024-05-31 MED ORDER — LACTATED RINGERS IV SOLN: INTRAVENOUS | Status: DC

## 2024-05-31 NOTE — Anesthesia Procedure Notes (Signed)
 Date/Time: 05/31/2024 10:01 AM  Performed by: Para Jerelene CROME, CRNAOxygen Delivery Method: Nasal cannula

## 2024-05-31 NOTE — Anesthesia Postprocedure Evaluation (Signed)
"   Anesthesia Post Note  Patient: Donald [name]  Procedure(s) Performed: COLONOSCOPY POLYPECTOMY, INTESTINE  Patient location during evaluation: Phase II Anesthesia Type: MAC Level of consciousness: awake Pain management: pain level controlled Vital Signs Assessment: post-procedure vital signs reviewed and stable Respiratory status: spontaneous breathing and respiratory function stable Cardiovascular status: blood pressure returned to baseline and stable Postop Assessment: no headache and no apparent nausea or vomiting Anesthetic complications: no Comments: Late entry   No notable events documented.   Last Vitals:  Vitals:   05/31/24 1047 05/31/24 1102  BP: (!) 96/50 115/78  Pulse: 66   Resp: 15   Temp: 36.6 C   SpO2: 98% 98%    Last Pain:  Vitals:   05/31/24 1102  TempSrc:   PainSc: 0-No pain                 Yvonna PARAS Clotee Schlicker      "

## 2024-05-31 NOTE — Discharge Instructions (Signed)

## 2024-05-31 NOTE — H&P (Signed)
 " Primary Care Physician:  [name], MD Primary Gastroenterologist:  Dr. Cinderella  Pre-Procedure History & Physical: HPI:  Donald [name] is a 59 y.o. male with f AR s/p AV replacement on Xarelto , nonischemic CM, HTN, paroxysmal A Fib, gout and HLD who presents for evaluation of Positive Cologuard test   Past Medical History:  Diagnosis Date   Acute gout of left foot 10/03/2019   Alcohol use 06/27/2019   Bone tumor 11/22/2013   Cardiomyopathy (HCC)    a. EF 30 to 35% by echo in 06/2019 in the setting of severe AI   COVID-19 04/16/2020   Encounter for screening for malignant neoplasm of colon 06/27/2019   Glenoid labral tear 10/27/2010   History of gout 06/27/2019   Hyperlipidemia    Mass of soft tissue of foot    Patent foramen ovale    Rotator cuff tear, right 10/27/2010   S/P aortic valve replacement with bioprosthetic valve 08/09/2019   29 mm Edwards Inspiris Resilia stented bovine pericardial tissue valve   S/P patent foramen ovale closure 08/09/2019   Severe aortic insufficiency     Past Surgical History:  Procedure Laterality Date   AORTIC VALVE REPLACEMENT N/A 08/09/2019   Procedure: AORTIC VALVE REPLACEMENT (AVR) using Celestia CROOKED Resilia 29 MM Aortic Valve.;  Surgeon: Dusty Sudie DEL, MD;  Location: [facility] OR;  Service: Open Heart Surgery;  Laterality: N/A;   BACK SURGERY     multiple back surgery   Benign tumor     Left shin   BUBBLE STUDY  07/05/2019   Procedure: BUBBLE STUDY;  Surgeon: Debera Jayson MATSU, MD;  Location: [facility] ORS;  Service: Cardiovascular;;   CARDIAC VALVE REPLACEMENT N/A    Phreesia 02/19/2020   HERNIA REPAIR     REPAIR OF PATENT FORAMEN OVALE N/A 08/09/2019   Procedure: Closure Of Patent Foramen Ovale;  Surgeon: Dusty Sudie DEL, MD;  Location: [facility] OR;  Service: Open Heart Surgery;  Laterality: N/A;   RIGHT/LEFT HEART CATH AND CORONARY ANGIOGRAPHY N/A 07/26/2019   Procedure: RIGHT/LEFT HEART CATH AND CORONARY ANGIOGRAPHY;  Surgeon: Verlin Lonni BIRCH, MD;  Location: [facility] INVASIVE CV LAB;  Service: Cardiovascular;  Laterality: N/A;   SHOULDER SURGERY Left    SHOULDER SURGERY Right    TEE WITHOUT CARDIOVERSION N/A 07/05/2019   Procedure: TRANSESOPHAGEAL ECHOCARDIOGRAM (TEE) WITH PROPOFOL ;  Surgeon: Debera Jayson MATSU, MD;  Location: [facility] ORS;  Service: Cardiovascular;  Laterality: N/A;   TEE WITHOUT CARDIOVERSION N/A 08/09/2019   Procedure: TRANSESOPHAGEAL ECHOCARDIOGRAM (TEE);  Surgeon: Dusty Sudie DEL, MD;  Location: [facility] OR;  Service: Open Heart Surgery;  Laterality: N/A;   Thumb surgery Left x3   VASECTOMY N/A    Phreesia 02/19/2020    Prior to Admission medications  Medication Sig Start Date End Date Taking? Authorizing Provider  allopurinol  (ZYLOPRIM ) 100 MG tablet TAKE 1 TABLET BY MOUTH EVERY DAY 08/09/23  Yes [name], MD  atorvastatin  (LIPITOR) 40 MG tablet TAKE 1 TABLET BY MOUTH EVERY DAY 11/07/23  Yes Miriam Norris, NP  Cholecalciferol (VITAMIN D3) 50 MCG (2000 UT) TABS Take 2,000 Units by mouth daily.   Yes [provider]  Coenzyme Q10 (COQ10) 100 MG CAPS Take 100 mg by mouth daily.   Yes [provider]  Glucosamine-Chondroitin (GLUCOSAMINE CHONDR COMPLEX PO) Take 2 tablets by mouth every morning.   Yes [provider]  losartan  (COZAAR ) 50 MG tablet Take 1.5 tablets (75 mg total) by mouth daily. 04/10/24  Yes Miriam Norris,  NP  metoprolol  succinate (TOPROL -XL) 50 MG 24 hr tablet TAKE 1 TABLET BY MOUTH DAILY. TAKE WITH OR IMMEDIATELY FOLLOWING A MEAL. 01/18/24  Yes Debera Jayson MATSU, MD  Multiple Vitamin (MULTIVITAMIN WITH MINERALS) TABS tablet Take 1 tablet by mouth daily.   Yes [provider]  omega-3 acid ethyl esters (LOVAZA ) 1 g capsule TAKE 2,000 MG BY MOUTH 2 (TWO) TIMES DAILY. 03/16/23  Yes Debera Jayson MATSU, MD  Testosterone  20.25 MG/ACT (1.62%) GEL PLACE 2 PUMPS OVER THE SHOULDER AREA ONCE DAILY 02/15/24   [name], MD  XARELTO  20 MG TABS tablet TAKE 1  TABLET BY MOUTH DAILY WITH SUPPER 12/16/23   Debera Jayson MATSU, MD    Allergies as of 05/11/2024 - Review Complete 04/10/2024  Allergen Reaction Noted   Porcine (pork) protein-containing drug products Other (See Comments) 11/07/2022   Ultram [tramadol hcl] Itching 10/27/2010    Family History  Problem Relation Age of Onset   Other Father        Kidney    Social History   Socioeconomic History   Marital status: Married    Spouse name: Randolm    Number of children: 4   Years of education: Not on file   Highest education level: Associate degree: occupational, scientist, product/process development, or vocational program  Occupational History   Occupation: radio producer    Employer: [employer]  Tobacco Use   Smoking status: Every Day    Current packs/day: 0.00    Average packs/day: 0.5 packs/day    Types: Cigarettes    Last attempt to quit: 08/02/2019    Years since quitting: 4.8   Smokeless tobacco: Never   Tobacco comments:    Smoking 9/10 ciggs per day   Vaping Use   Vaping status: Never Used  Substance and Sexual Activity   Alcohol use: Yes    Alcohol/week: 3.0 standard drinks of alcohol    Types: 3 Glasses of wine per week    Comment: a little wine   Drug use: No   Sexual activity: Not on file  Other Topics Concern   Not on file  Social History Narrative   Live Randolm wife of 2 years, previously was widowed from first wife from cancer 01/2011      Four children      Enjoy: golf, target shooting       Diet:    Caffeine: 16-20 oz coffee    Water: 6-7 bottles       Wears seat belt    Smoke detectors at home    Does not use phone while driving    Social Drivers of Health   Tobacco Use: High Risk (05/31/2024)   Patient History    Smoking Tobacco Use: Every Day    Smokeless Tobacco Use: Never    Passive Exposure: Not on file  Financial Resource Strain: Low Risk (02/15/2024)   Overall Financial Resource Strain (CARDIA)    Difficulty of Paying Living Expenses: Not very hard   Food Insecurity: No Food Insecurity (02/15/2024)   Epic    Worried About Radiation Protection Practitioner of Food in the Last Year: Never true    Ran Out of Food in the Last Year: Never true  Transportation Needs: No Transportation Needs (02/15/2024)   Epic    Lack of Transportation (Medical): No    Lack of Transportation (Non-Medical): No  Physical Activity: Sufficiently Active (02/15/2024)   Exercise Vital Sign    Days of Exercise per Week: 5 days    Minutes  of Exercise per Session: 40 min  Stress: No Stress Concern Present (02/15/2024)   [employer] of Occupational Health - Occupational Stress Questionnaire    Feeling of Stress: Only a little  Social Connections: Moderately Isolated (02/15/2024)   Social Connection and Isolation Panel    Frequency of Communication with Friends and Family: Twice a week    Frequency of Social Gatherings with Friends and Family: Once a week    Attends Religious Services: Never    Database Administrator or Organizations: No    Attends Engineer, Structural: Not on file    Marital Status: Married  Catering Manager Violence: Not At Risk (11/06/2022)   Humiliation, Afraid, Rape, and Kick questionnaire    Fear of Current or Ex-Partner: No    Emotionally Abused: No    Physically Abused: No    Sexually Abused: No  Depression (PHQ2-9): Low Risk (02/15/2024)   Depression (PHQ2-9)    PHQ-2 Score: 0  Alcohol Screen: Medium Risk (02/15/2024)   Alcohol Screen    Last Alcohol Screening Score (AUDIT): 8  Housing: Low Risk (02/15/2024)   Epic    Unable to Pay for Housing in the Last Year: No    Number of Times Moved in the Last Year: 0    Homeless in the Last Year: No  Utilities: Not At Risk (11/06/2022)   [facility] Utilities    Threatened with loss of utilities: No  Health Literacy: Not on file    Review of Systems: See HPI, otherwise negative ROS  Physical Exam: Vital signs in last 24 hours: Temp:  [98.3 F (36.8 C)] 98.3 F (36.8 C) (02/05 0915) Pulse  Rate:  [75] 75 (02/05 0915) Resp:  [12] 12 (02/05 0915) BP: (137)/(86) 137/86 (02/05 0915) SpO2:  [98 %] 98 % (02/05 0915)   General:   Alert,  Well-developed, well-nourished, pleasant and cooperative in NAD Head:  Normocephalic and atraumatic. Eyes:  Sclera clear, no icterus.   Conjunctiva pink. Ears:  Normal auditory acuity. Nose:  No deformity, discharge,  or lesions. Msk:  Symmetrical without gross deformities. Normal posture. Extremities:  Without clubbing or edema. Neurologic:  Alert and  oriented x4;  grossly normal neurologically. Skin:  Intact without significant lesions or rashes. Psych:  Alert and cooperative. Normal mood and affect.  Impression/Plan: Donald [name] is a 59 y.o. male with f AR s/p AV replacement on Xarelto , nonischemic CM, HTN, paroxysmal A Fib, gout and HLD who presents for evaluation of Positive Cologuard test .  Proceed with colonoscopy   The risks of the procedure including infection, bleed, or perforation as well as benefits, limitations, alternatives and imponderables have been reviewed with the patient. Questions have been answered. All parties agreeable.  "

## 2024-05-31 NOTE — Anesthesia Preprocedure Evaluation (Signed)
"                                    Anesthesia Evaluation  Patient identified by MRN, date of birth, ID band Patient awake    Reviewed: Allergy & Precautions, H&P , NPO status , Patient's Chart, lab work & pertinent test results, reviewed documented beta blocker date and time   Airway Mallampati: II  TM Distance: >3 FB Neck ROM: full    Dental no notable dental hx.    Pulmonary neg pulmonary ROS, Current Smoker and Patient abstained from smoking.   Pulmonary exam normal breath sounds clear to auscultation       Cardiovascular Exercise Tolerance: Good hypertension, + dysrhythmias  Rhythm:regular Rate:Normal     Neuro/Psych  PSYCHIATRIC DISORDERS      negative neurological ROS     GI/Hepatic negative GI ROS, Neg liver ROS,,,  Endo/Other  negative endocrine ROS    Renal/GU negative Renal ROS  negative genitourinary   Musculoskeletal   Abdominal   Peds  Hematology negative hematology ROS (+)   Anesthesia Other Findings   Reproductive/Obstetrics negative OB ROS                              Anesthesia Physical Anesthesia Plan  ASA: 2  Anesthesia Plan: MAC   Post-op Pain Management:    Induction:   PONV Risk Score and Plan: Propofol  infusion  Airway Management Planned:   Additional Equipment:   Intra-op Plan:   Post-operative Plan:   Informed Consent: I have reviewed the patients History and Physical, chart, labs and discussed the procedure including the risks, benefits and alternatives for the proposed anesthesia with the patient or authorized representative who has indicated his/her understanding and acceptance.     Dental Advisory Given  Plan Discussed with: CRNA  Anesthesia Plan Comments:         Anesthesia Quick Evaluation  "

## 2024-05-31 NOTE — Op Note (Signed)
 [facility] Patient Name: Donald [name] Procedure Date: 05/31/2024 9:50 AM MRN: [id number] Date of Birth: [birth year] Attending MD: Deatrice Dine , MD, [id number] CSN: [id number] Age: 59 Admit Type: Outpatient Procedure:                Colonoscopy Indications:              Positive Cologuard test Providers:                Deatrice Dine, MD, Jon LABOR. Gerome RN, RN, Alm Shipper Tech., Technician Referring MD:              Medicines:                Monitored Anesthesia Care Complications:            No immediate complications. Estimated Blood Loss:     Estimated blood loss was minimal. Procedure:                Pre-Anesthesia Assessment:                           - Prior to the procedure, a History and Physical                            was performed, and patient medications and                            allergies were reviewed. The patient's tolerance of                            previous anesthesia was also reviewed. The risks                            and benefits of the procedure and the sedation                            options and risks were discussed with the patient.                            All questions were answered, and informed consent                            was obtained. Prior Anticoagulants: The patient has                            taken Eliquis  (apixaban ), last dose was 2 days                            prior to procedure. ASA Grade Assessment: III - A                            patient with severe systemic disease. After  reviewing the risks and benefits, the patient was                            deemed in satisfactory condition to undergo the                            procedure.                           After obtaining informed consent, the colonoscope                            was passed under direct vision. Throughout the                            procedure, the patient's blood pressure,  pulse, and                            oxygen saturations were monitored continuously. The                            CF-[id number] ([id number]) Colon was introduced through                            the and advanced to the the cecum, identified by                            appendiceal orifice and ileocecal valve. The                            colonoscopy was performed without difficulty. The                            patient tolerated the procedure well. The quality                            of the bowel preparation was evaluated using the                            BBPS [facility] Bowel Preparation Scale) with scores                            of: Right Colon = 2 (minor amount of residual                            staining, small fragments of stool and/or opaque                            liquid, but mucosa seen well), Transverse Colon = 2                            (minor amount of residual staining, small fragments  of stool and/or opaque liquid, but mucosa seen                            well) and Left Colon = 2 (minor amount of residual                            staining, small fragments of stool and/or opaque                            liquid, but mucosa seen well). The total BBPS score                            equals 6. The ileocecal valve, appendiceal orifice,                            and rectum were photographed. Scope In: 10:09:21 AM Scope Out: 10:42:40 AM Scope Withdrawal Time: 0 hours 25 minutes 18 seconds  Total Procedure Duration: 0 hours 33 minutes 19 seconds  Findings:      The perianal and digital rectal examinations were normal.      Four sessile polyps were found in the sigmoid colon, transverse colon,       ascending colon and cecum. The polyps were 3 to 6 mm in size. These       polyps were removed with a cold snare. Resection and retrieval were       complete.      A few medium-mouthed diverticula were found in the left colon.       Non-bleeding internal hemorrhoids were found during retroflexion. The       hemorrhoids were small. Impression:               - Four 3 to 6 mm polyps in the sigmoid colon, in                            the transverse colon, in the ascending colon and in                            the cecum, removed with a cold snare. Resected and                            retrieved.                           - Diverticulosis in the left colon.                           - Non-bleeding internal hemorrhoids. Moderate Sedation:      Per Anesthesia Care Recommendation:           - Patient has a contact number available for                            emergencies. The signs and symptoms of potential                            delayed  complications were discussed with the                            patient. Return to normal activities tomorrow.                            Written discharge instructions were provided to the                            patient.                           - Resume previous diet.                           - Continue present medications.                           - Await pathology results.                           - Repeat colonoscopy in 3 years for surveillance                            based on pathology results. Next time with extended                            bowel prep                           - Return to primary care physician as previously                            scheduled. Procedure Code(s):        --- Professional ---                           [phone], Colonoscopy, flexible; with removal of                            tumor(s), polyp(s), or other lesion(s) by snare                            technique Diagnosis Code(s):        --- Professional ---                           D12.5, Benign neoplasm of sigmoid colon                           D12.3, Benign neoplasm of transverse colon (hepatic                            flexure or splenic flexure)                            D12.2, Benign neoplasm of ascending colon  D12.0, Benign neoplasm of cecum                           K64.8, Other hemorrhoids                           R19.5, Other fecal abnormalities                           K57.30, Diverticulosis of large intestine without                            perforation or abscess without bleeding CPT copyright 2022 American Medical Association. All rights reserved. The codes documented in this report are preliminary and upon coder review may  be revised to meet current compliance requirements. Deatrice Dine, MD Deatrice Dine, MD 05/31/2024 10:52:49 AM This report has been signed electronically. Number of Addenda: 0

## 2024-05-31 NOTE — Transfer of Care (Addendum)
 Immediate Anesthesia Transfer of Care Note  Patient: Donald [name]  Procedure(s) Performed: COLONOSCOPY POLYPECTOMY, INTESTINE  Patient Location: Short Stay  Anesthesia Type:MAC  Level of Consciousness: drowsy and patient cooperative  Airway & Oxygen Therapy: Patient Spontanous Breathing and Patient connected to nasal cannula oxygen  Post-op Assessment: Report given to RN and Post -op Vital signs reviewed and stable  Post vital signs: Reviewed and stable  Last Vitals:  Vitals Value Taken Time  BP 96/50 05/31/24   1047  Temp 36.6 05/31/24   1047  Pulse 65 05/31/24   1047  Resp 15 05/31/24   1047  SpO2 98% 05/31/24   1047    Last Pain:  Vitals:   05/31/24 1001  PainSc: 0-No pain         Complications: No notable events documented.

## 2024-06-01 LAB — SURGICAL PATHOLOGY

## 2024-06-18 ENCOUNTER — Ambulatory Visit: Admitting: Internal Medicine
